# Patient Record
Sex: Female | Born: 1960 | Race: Black or African American | Hispanic: No | Marital: Single | Smoking: Never smoker
Health system: Southern US, Community
[De-identification: ages and names within clinical notes are randomized; demographics above are authoritative.]

## PROBLEM LIST (undated history)

## (undated) DIAGNOSIS — I10 Essential (primary) hypertension: Secondary | ICD-10-CM

## (undated) DIAGNOSIS — I712 Thoracic aortic aneurysm, without rupture: Secondary | ICD-10-CM

## (undated) DIAGNOSIS — R0609 Other forms of dyspnea: Secondary | ICD-10-CM

## (undated) DIAGNOSIS — M1990 Unspecified osteoarthritis, unspecified site: Secondary | ICD-10-CM

## (undated) DIAGNOSIS — E785 Hyperlipidemia, unspecified: Secondary | ICD-10-CM

## (undated) DIAGNOSIS — T7840XA Allergy, unspecified, initial encounter: Secondary | ICD-10-CM

## (undated) DIAGNOSIS — N3941 Urge incontinence: Secondary | ICD-10-CM

## (undated) DIAGNOSIS — R079 Chest pain, unspecified: Secondary | ICD-10-CM

## (undated) DIAGNOSIS — R0600 Dyspnea, unspecified: Secondary | ICD-10-CM

## (undated) HISTORY — PX: ABDOMINAL HYSTERECTOMY: SHX81

## (undated) HISTORY — DX: Other forms of dyspnea: R06.09

## (undated) HISTORY — DX: Allergy, unspecified, initial encounter: T78.40XA

## (undated) HISTORY — DX: Dyspnea, unspecified: R06.00

## (undated) HISTORY — DX: Urge incontinence: N39.41

## (undated) HISTORY — DX: Chest pain, unspecified: R07.9

## (undated) HISTORY — DX: Thoracic aortic aneurysm, without rupture: I71.2

---

## 1999-04-09 ENCOUNTER — Emergency Department (HOSPITAL_COMMUNITY): Admission: EM | Admit: 1999-04-09 | Discharge: 1999-04-09 | Payer: Self-pay | Admitting: Emergency Medicine

## 1999-04-09 ENCOUNTER — Encounter: Payer: Self-pay | Admitting: Emergency Medicine

## 2001-05-17 ENCOUNTER — Emergency Department (HOSPITAL_COMMUNITY): Admission: EM | Admit: 2001-05-17 | Discharge: 2001-05-17 | Payer: Self-pay | Admitting: Emergency Medicine

## 2001-05-18 ENCOUNTER — Encounter: Payer: Self-pay | Admitting: Emergency Medicine

## 2001-06-23 ENCOUNTER — Ambulatory Visit (HOSPITAL_COMMUNITY): Admission: RE | Admit: 2001-06-23 | Discharge: 2001-06-23 | Payer: Self-pay | Admitting: Obstetrics

## 2001-06-23 ENCOUNTER — Encounter: Payer: Self-pay | Admitting: Obstetrics

## 2001-07-07 ENCOUNTER — Ambulatory Visit (HOSPITAL_COMMUNITY): Admission: RE | Admit: 2001-07-07 | Discharge: 2001-07-07 | Payer: Self-pay | Admitting: Obstetrics

## 2001-07-07 ENCOUNTER — Encounter: Payer: Self-pay | Admitting: Obstetrics

## 2001-08-18 ENCOUNTER — Inpatient Hospital Stay (HOSPITAL_COMMUNITY): Admission: AD | Admit: 2001-08-18 | Discharge: 2001-08-18 | Payer: Self-pay | Admitting: Obstetrics

## 2001-11-02 ENCOUNTER — Inpatient Hospital Stay (HOSPITAL_COMMUNITY): Admission: AD | Admit: 2001-11-02 | Discharge: 2001-11-02 | Payer: Self-pay | Admitting: *Deleted

## 2001-11-09 ENCOUNTER — Ambulatory Visit (HOSPITAL_COMMUNITY): Admission: RE | Admit: 2001-11-09 | Discharge: 2001-11-09 | Payer: Self-pay | Admitting: *Deleted

## 2001-12-08 ENCOUNTER — Inpatient Hospital Stay (HOSPITAL_COMMUNITY): Admission: AD | Admit: 2001-12-08 | Discharge: 2001-12-08 | Payer: Self-pay | Admitting: *Deleted

## 2001-12-11 ENCOUNTER — Inpatient Hospital Stay (HOSPITAL_COMMUNITY): Admission: AD | Admit: 2001-12-11 | Discharge: 2001-12-13 | Payer: Self-pay | Admitting: *Deleted

## 2001-12-12 ENCOUNTER — Encounter (INDEPENDENT_AMBULATORY_CARE_PROVIDER_SITE_OTHER): Payer: Self-pay | Admitting: Specialist

## 2003-07-24 ENCOUNTER — Emergency Department (HOSPITAL_COMMUNITY): Admission: EM | Admit: 2003-07-24 | Discharge: 2003-07-24 | Payer: Self-pay | Admitting: Family Medicine

## 2004-01-18 ENCOUNTER — Ambulatory Visit: Payer: Self-pay | Admitting: Internal Medicine

## 2004-05-07 ENCOUNTER — Ambulatory Visit: Payer: Self-pay | Admitting: Internal Medicine

## 2004-05-07 ENCOUNTER — Other Ambulatory Visit: Admission: RE | Admit: 2004-05-07 | Discharge: 2004-05-07 | Payer: Self-pay | Admitting: Obstetrics and Gynecology

## 2004-10-03 ENCOUNTER — Emergency Department (HOSPITAL_COMMUNITY): Admission: EM | Admit: 2004-10-03 | Discharge: 2004-10-03 | Payer: Self-pay | Admitting: Family Medicine

## 2004-12-09 ENCOUNTER — Observation Stay (HOSPITAL_COMMUNITY): Admission: RE | Admit: 2004-12-09 | Discharge: 2004-12-10 | Payer: Self-pay | Admitting: Obstetrics and Gynecology

## 2004-12-09 ENCOUNTER — Encounter (INDEPENDENT_AMBULATORY_CARE_PROVIDER_SITE_OTHER): Payer: Self-pay | Admitting: Specialist

## 2005-06-29 ENCOUNTER — Emergency Department (HOSPITAL_COMMUNITY): Admission: EM | Admit: 2005-06-29 | Discharge: 2005-06-29 | Payer: Self-pay | Admitting: Emergency Medicine

## 2008-12-26 ENCOUNTER — Encounter (INDEPENDENT_AMBULATORY_CARE_PROVIDER_SITE_OTHER): Payer: Self-pay | Admitting: Emergency Medicine

## 2008-12-26 ENCOUNTER — Observation Stay (HOSPITAL_COMMUNITY): Admission: EM | Admit: 2008-12-26 | Discharge: 2008-12-26 | Payer: Self-pay | Admitting: Emergency Medicine

## 2008-12-26 ENCOUNTER — Ambulatory Visit: Payer: Self-pay | Admitting: Cardiovascular Disease

## 2009-08-21 ENCOUNTER — Emergency Department (HOSPITAL_COMMUNITY): Admission: EM | Admit: 2009-08-21 | Discharge: 2009-08-21 | Payer: Self-pay | Admitting: Emergency Medicine

## 2010-03-09 HISTORY — PX: KNEE ARTHROSCOPY: SHX127

## 2010-06-12 LAB — URINALYSIS, ROUTINE W REFLEX MICROSCOPIC
Bilirubin Urine: NEGATIVE
Hgb urine dipstick: NEGATIVE
Nitrite: NEGATIVE
Urobilinogen, UA: 1 mg/dL (ref 0.0–1.0)

## 2010-06-12 LAB — COMPREHENSIVE METABOLIC PANEL
ALT: 18 U/L (ref 0–35)
Albumin: 3.6 g/dL (ref 3.5–5.2)
Alkaline Phosphatase: 72 U/L (ref 39–117)
BUN: 11 mg/dL (ref 6–23)
Creatinine, Ser: 0.88 mg/dL (ref 0.4–1.2)
GFR calc non Af Amer: >60 mL/min (ref >60)
Sodium: 140 mEq/L (ref 135–145)

## 2010-06-12 LAB — CBC
HCT: 35.2 % — ABNORMAL LOW (ref 36.0–46.0)
Hemoglobin: 12.1 g/dL (ref 12.0–15.0)
RBC: 3.97 MIL/uL (ref 3.87–5.11)
WBC: 10.1 10*3/uL (ref 4.0–10.5)

## 2010-06-12 LAB — POCT CARDIAC MARKERS
CKMB, poc: <1 ng/mL — ABNORMAL LOW (ref 1.0–8.0)
Troponin i, poc: <0.05 ng/mL (ref 0.00–0.09)

## 2010-06-12 LAB — DIFFERENTIAL
Lymphs Abs: 2.9 10*3/uL (ref 0.7–4.0)
Monocytes Absolute: 0.8 10*3/uL (ref 0.1–1.0)
Monocytes Relative: 8 % (ref 3–12)
Neutrophils Relative %: 62 % (ref 43–77)

## 2010-06-12 LAB — CK TOTAL AND CKMB (NOT AT ARMC): Relative Index: INVALID (ref 0.0–2.5)

## 2010-07-25 NOTE — Op Note (Signed)
   NAME:  Audrey Farley, Audrey Farley                     ACCOUNT NO.:  [id number]   MEDICAL RECORD NO.:  [id number]                   PATIENT TYPE:  INP   LOCATION:  9138                                 FACILITY:  [facility]   PHYSICIAN:  Phil D. Okey Dupre, M.D.                  DATE OF BIRTH:  11/08/1960   DATE OF PROCEDURE:  12/12/2001  DATE OF DISCHARGE:                                 OPERATIVE REPORT   PROCEDURE:  Bilateral tubal ligation, modified Pomeroy.   PREOPERATIVE DIAGNOSES:  Voluntary sterilization.   POSTOPERATIVE DIAGNOSES:  Voluntary sterilization.   PROCEDURE AS FOLLOWS:  Under satisfactory epidural anesthesia with the  patient in dorsal supine position, the abdomen was prepped and draped in the  usual sterile manner.  A 4 cm transverse incision was made just below the  umbilicus and the abdomen was entered by layers.  On entering the peritoneal  cavity was a marked amount of serosanguineous fluid which was extracted with  suction.  The fallopian tube on the right was grasped with a Babcock clamp,  followed down to the fimbria, then grasped in the mid point and hemostat  placed through the meso beneath the tube in avascular area.  She had one  plain suture drawn through this and tied around the distal and proximal ends  of the tube forming a loop approximately 1 cm in length above the tube.  A  second tie was placed just below the aforementioned tie and dissection of  the tube above excised.  The ends of the tube were then coagulated with hot  cautery.  It was noted there was a slight oozing below the second tie so a  third tie was placed below that and area observed for bleeding and none was  noted and these were all cut short.  Attention was directed to the left tube  left side.  The fallopian tube was identified by the same method and the  same procedure carried out with a suction of tube excised.  The peritoneum  and fascia were then closed with a 0 Vicryl suture which was  continued  subcuticular closure.  Dry, sterile dressing was applied and patient  transferred to recovery room in satisfactory condition after having  tolerated the procedure well.                                               Phil D. Okey Dupre, M.D.    PDR/MEDQ  D:  12/12/2001  T:  12/12/2001  Job:  [id number]

## 2010-07-25 NOTE — Discharge Summary (Signed)
NAME:  Audrey Farley, Audrey Farley           ACCOUNT NO.:  [id number]   MEDICAL RECORD NO.:  [id number]          PATIENT TYPE:  OBV   LOCATION:  9319                          [facility]ACILITY:  [facility]   PHYSICIAN:  Duke Salvia. Marcelle Overlie, M.D.DATE OF BIRTH:  [birth year]   DATE OF ADMISSION:  12/09/2004  DATE OF DISCHARGE:  12/10/2004                                 DISCHARGE SUMMARY   DISCHARGE DIAGNOSES:  1.  Pelvic pain and dyspareunia.  2.  Probable adenomyosis.  3.  LSH this admission.   HISTORY OF PRESENT ILLNESS:  Please see admission H&P for details.  Briefly  a 50 year old gravida 4, para 2, prior tubal with pelvic pain and  dysmenorrhea whose evaluation is consistent with adenomyosis, presents for  [facility].   HOSPITAL COURSE:  On October 3, under general anesthesia, the patient  underwent laparoscopy with [facility].  Estimated blood loss was 300 mL.  The  uterus was symmetrically enlarged, at the time approximately 8 weeks size.   She remained afebrile the night of surgery.  On postoperative day #1, the  catheter was removed.  She was ambulating.  Her diet was advanced.  She  stayed until later in the afternoon because of still feeling some nausea and  mild dizziness which improved significantly and at 4:30 she was ready for  discharge.  On postoperative day #1, she was afebrile, her vital signs were  stable.  Preoperative hemoglobin was 11.6.  On October 3, in the afternoon,  hemoglobin 10.8.  On October 4, hemoglobin 9.9.   LABORATORY DATA:  Blood type was O positive, antibody screen negative.  Admission UA was negative except for 1+ glucose.   DISPOSITION:  The patient was discharged on Tylox p.r.n. pain, advised to  report any incisional redness or drainage, increased pain or bleeding, or  fever over 101.  Was given specific instructions regarding diet, sex, and  exercise.  Will return to the office in 1 week.  Multivitamin with iron  daily advised.      Richard M. Marcelle Overlie, M.D.  Electronically  Signed     RMH/MEDQ  D:  12/10/2004  T:  12/11/2004  Job:  [id number]

## 2010-07-25 NOTE — Op Note (Signed)
NAME:  Audrey Farley, Audrey Farley           ACCOUNT NO.:  [id number]   MEDICAL RECORD NO.:  [id number]          PATIENT TYPE:  OBV   LOCATION:  9399                          [facility]  [facility]   PHYSICIAN:  Duke Salvia. Marcelle Overlie, M.D.DATE OF BIRTH:  [birth year]   DATE OF PROCEDURE:  12/09/2004  DATE OF DISCHARGE:                                 OPERATIVE REPORT   PREOPERATIVE DIAGNOSIS:  Pelvic pain, probable adenomyosis.   POSTOPERATIVE DIAGNOSIS:  Pelvic pain, probable adenomyosis.   PROCEDURE:  LSH.   SURGEON:  Duke Salvia. Marcelle Overlie, M.D.   ASSISTANT:  Guy Sandifer. Henderson Cloud, M.D.   ANESTHESIA:  General endotracheal.   ESTIMATED BLOOD LOSS:  300 mL.   SPECIMENS REMOVED:  Uterine fundus.   DESCRIPTION OF PROCEDURE:  The patient was taken to the operating room,  after adequate level of general endotracheal anesthesia was obtained with  the patient's legs in stirrups. The abdomen, perineum and vagina were  prepped and draped in usual manner for a LSH. The bladder was drained, EUA  carried out. Uterus upper limits normal size, mid position, mobile, adnexa  negative. The subumbilical area was infiltrated with half percent Marcaine  plain ans a small incision was made. The Veress needle was introduced  without difficulty and its intra-abdominal position was verified by pressure  and water testing. After 2.5 liter pneumoperitoneum was created,  laparoscopic trocar and sleeve were then introduced without difficulty.  There was no evidence of any bleeding or trauma. The patient placed in  slight Trendelenburg. Negative transillumination used to mark the left and  right lower quadrant area and the 11 mm bladeless trocar was inserted under  direct visualization on either side. The uterine fundus then grasped with  tenaculum. Careful inspection revealed both ovaries to be unremarkable. The  anterior-posterior spaces were free and clear. The uterus was boggy  consistent with adenomyosis. Starting on the left,  harmonic scalpel was then  used to coagulate and cut down to including the round ligament. The anterior  peritoneum was then carried across to the midline. The bladder was bluntly  dissected below. The same was repeated on the opposite side conserving both  ovaries. After this was completed minimal blunt dissection was used to push  the bladder flap below. Starting on the left the ascending branch of the  uterine artery was coagulated in several areas and cut. The exact same  repeated on the opposite side. The fundus started to blanch at that point.  The harmonic scalpel was then used to remove the remainder of the uterine  fundus which was hemostatic. The endocervical canal was coagulated with the  harmonic scalpel on low setting. Pelvis was then irrigated with saline and  aspirated. The bipolar was used along the left angle for complete  hemostasis. Once this was hemostatic, morcellator was introduced into the  left lower quadrant and the fundus was morcellated without difficulty. After  this completed extensive irrigation was carried out with aspiration. The  operative site was hemostatic. The gas pressure was reduced and excellent  hemostasis was noted. No tissue fragments remained. Intercede was placed  across the cervical stump.  The  laparoscopic suture passer was used to then close the left lower quadrant  fascia and the remainder of the incisions were closed with 4-0 subcuticular  sutures and Dermabond. Gas was allowed to escape before the incisions were  closed. Audrey Farley tolerated this well and went to recovery room in good condition.      Richard M. Marcelle Overlie, M.D.  Electronically Signed     RMH/MEDQ  D:  12/09/2004  T:  12/09/2004  Job:  [id number]

## 2010-07-25 NOTE — H&P (Signed)
NAME:  Audrey Farley, Audrey Farley           ACCOUNT NO.:  [id number]   MEDICAL RECORD NO.:  [id number]          PATIENT TYPE:  AMB   LOCATION:  SDC                           [facility]  [facility]   PHYSICIAN:  Duke Salvia. Marcelle Overlie, M.D.DATE OF BIRTH:  1960-08-03   DATE OF ADMISSION:  DATE OF DISCHARGE:                                HISTORY & PHYSICAL   DATE OF SURGERY:  December 09, 2004   CHIEF COMPLAINT:  Pelvic pain, possible adenomyosis.   HISTORY OF PRESENT ILLNESS:  A 50 year old G4 P2, prior tubal, who has  complained of pelvic pain and dysmenorrhea for the last 6-12 months.  Evaluation at Urgent Care in August with a CT was stated to be normal. By  exam, uterus upper limit of normal size.   Our ultrasound dated May 30, 2004 showed an inhomogeneous myometrium,  thickened, with some cystic areas suspicious for adenomyosis, several very  small leiomyoma, adnexa otherwise unremarkable. Pain has required narcotics,  did not get good relief with nonsteroidal medicines, and declined further  treatment with Depo-Provera, OCPs, etc. She presents now for [facility]. She has  had no prior surgery other than the tubal, and no history of abnormal  cytology in the past. Last Pap March 2006 was normal. She presents now for  [facility]. This procedure including risks of bleeding, infection, transfusion,  adjacent organ injury, the possible need for open or additional surgery  discussed. The need for yearly cytology post [facility] reviewed, along with the  small chance of some residual monthly spotting.   PAST MEDICAL HISTORY:  Allergies:  None. Operations:  Tubal ligation.   OBSTETRICAL HISTORY:  Two vaginal deliveries at term, one stillborn in 71,  and one miscarriage.   REVIEW OF SYSTEMS:  Significant for migraine headache.   FAMILY HISTORY:  Mother decreased from kidney failure and also she had  kidney stone. Family history of hypertension also.   PHYSICAL EXAMINATION:  VITAL SIGNS:  Temperature 98.2, blood  pressure  124/84.  HEENT:  Unremarkable.  NECK:  Supple without masses.  LUNGS:  Clear.  CARDIOVASCULAR:  Regular rate and rhythm without murmurs, rubs, or gallops  noted.  BREASTS:  Without masses.  ABDOMEN:  Soft, flat, and nontender.  PELVIC:  Normal external genitalia, vagina and cervix clear. Uterus mid  position, normal size, adnexa negative.   IMPRESSION:  Pelvic pain, probable adenomyosis.   PLAN:  LSH. Procedure and risks reviewed as above.      Richard M. Marcelle Overlie, M.D.  Electronically Signed     RMH/MEDQ  D:  12/05/2004  T:  12/05/2004  Job:  [id number]

## 2010-10-17 ENCOUNTER — Other Ambulatory Visit: Payer: Self-pay | Admitting: Family Medicine

## 2010-10-17 ENCOUNTER — Ambulatory Visit
Admission: RE | Admit: 2010-10-17 | Discharge: 2010-10-17 | Disposition: A | Discharge status: Home / Self Care | Payer: Managed Care, Other (non HMO) | Source: Ambulatory Visit | Attending: Family Medicine | Admitting: Family Medicine

## 2010-10-17 DIAGNOSIS — R609 Edema, unspecified: Secondary | ICD-10-CM

## 2010-10-17 DIAGNOSIS — R52 Pain, unspecified: Secondary | ICD-10-CM

## 2010-10-23 ENCOUNTER — Other Ambulatory Visit: Payer: Self-pay | Admitting: Family Medicine

## 2010-10-23 DIAGNOSIS — M25562 Pain in left knee: Secondary | ICD-10-CM

## 2010-11-06 ENCOUNTER — Other Ambulatory Visit: Payer: Managed Care, Other (non HMO)

## 2010-11-11 ENCOUNTER — Emergency Department (HOSPITAL_BASED_OUTPATIENT_CLINIC_OR_DEPARTMENT_OTHER)
Admission: EM | Admit: 2010-11-11 | Discharge: 2010-11-11 | Disposition: A | Discharge status: Home / Self Care | Payer: Managed Care, Other (non HMO) | Attending: Emergency Medicine | Admitting: Emergency Medicine

## 2010-11-11 ENCOUNTER — Encounter: Payer: Self-pay | Admitting: *Deleted

## 2010-11-11 DIAGNOSIS — I10 Essential (primary) hypertension: Secondary | ICD-10-CM | POA: Insufficient documentation

## 2010-11-11 DIAGNOSIS — M25569 Pain in unspecified knee: Secondary | ICD-10-CM | POA: Insufficient documentation

## 2010-11-11 DIAGNOSIS — IMO0001 Reserved for inherently not codable concepts without codable children: Secondary | ICD-10-CM | POA: Insufficient documentation

## 2010-11-11 DIAGNOSIS — S8990XA Unspecified injury of unspecified lower leg, initial encounter: Secondary | ICD-10-CM

## 2010-11-11 DIAGNOSIS — M79609 Pain in unspecified limb: Secondary | ICD-10-CM | POA: Insufficient documentation

## 2010-11-11 HISTORY — DX: Essential (primary) hypertension: I10

## 2010-11-11 MED ORDER — HYDROCODONE-ACETAMINOPHEN 5-325 MG PO TABS: 2.0000 | ORAL_TABLET | ORAL | Status: AC | PRN

## 2010-11-11 NOTE — ED Notes (Signed)
Left leg pain. Left leg pain x 2 months. [facility] and told she has a cyst behind her knee. She is suppose to schedule an MRI.

## 2010-11-11 NOTE — ED Provider Notes (Signed)
History     CSN: [id number] Arrival date & time: 11/11/2010  2:34 PM  Chief Complaint  Patient presents with  . Leg Pain   HPI Comments: Pt had doppler and was diagnosed with a popliteal cyst.  Pt complains of continued knee pain.  Pt reports knee feels like it is giving away.  Patient is a 50 y.o. female presenting with leg pain and knee pain.  Leg Pain  The incident occurred more than 1 week ago. The incident occurred at work. There was no injury mechanism. The pain is present in the left knee. The quality of the pain is described as sharp and throbbing. The pain is at a severity of 6/10. The pain is moderate. The pain has been constant since onset. She reports no foreign bodies present. The symptoms are aggravated by bearing weight. She has tried ice and NSAIDs for the symptoms.  Knee Pain This is a recurrent problem. The current episode started 1 to 4 weeks ago. The problem occurs constantly. The problem has been rapidly worsening. Associated symptoms include joint swelling and myalgias. The symptoms are aggravated by nothing. She has tried nothing for the symptoms. The treatment provided moderate relief.    Past Medical History  Diagnosis Date  . Hypertension     Past Surgical History  Procedure Date  . Abdominal hysterectomy     No family history on file.  History  Substance Use Topics  . Smoking status: Never Smoker   . Smokeless tobacco: Not on file  . Alcohol Use: Yes    OB History    Grav Para Term Preterm Abortions TAB SAB Ect Mult Living                  Review of Systems  Musculoskeletal: Positive for myalgias and joint swelling.  All other systems reviewed and are negative.    Physical Exam  BP 124/73  Pulse 79  Temp(Src) 98.2 F (36.8 C) (Oral)  Resp 20  SpO2 97%  Physical Exam  Nursing note and vitals reviewed. Constitutional: She is oriented to person, place, and time. She appears well-developed and well-nourished.  HENT:  Head:  Normocephalic and atraumatic.  Eyes: Pupils are equal, round, and reactive to light.  Neck: Normal range of motion.  Musculoskeletal: She exhibits edema and tenderness.  Neurological: She is alert and oriented to person, place, and time. She has normal reflexes.       Swollen knee,  Decreased range of motion,  nv and ns intact  Skin: Skin is warm and dry.  Psychiatric: She has a normal mood and affect.    ED Course  Procedures  MDM Pt placed in knee immbolizer, crutches,  Pt given hydrocodone.  Pt scheduled for MRi.      Langston Masker, [place] 11/11/10 1510  Langston Masker, [place] 11/11/10 [phone]

## 2010-11-11 NOTE — ED Provider Notes (Signed)
Medical screening examination/treatment/procedure(s) were performed by non-physician practitioner and as supervising physician I was immediately available for consultation/collaboration.   Glynn Octave, MD 11/11/10 [phone]

## 2010-11-12 ENCOUNTER — Ambulatory Visit (HOSPITAL_BASED_OUTPATIENT_CLINIC_OR_DEPARTMENT_OTHER)
Admission: RE | Admit: 2010-11-12 | Discharge: 2010-11-12 | Disposition: A | Discharge status: Home / Self Care | Payer: Managed Care, Other (non HMO) | Source: Ambulatory Visit | Attending: Physician Assistant | Admitting: Physician Assistant

## 2010-11-12 DIAGNOSIS — M7120 Synovial cyst of popliteal space [Baker], unspecified knee: Secondary | ICD-10-CM

## 2010-11-12 DIAGNOSIS — M25869 Other specified joint disorders, unspecified knee: Secondary | ICD-10-CM | POA: Insufficient documentation

## 2010-11-12 DIAGNOSIS — M23329 Other meniscus derangements, posterior horn of medial meniscus, unspecified knee: Secondary | ICD-10-CM

## 2010-11-12 DIAGNOSIS — IMO0002 Reserved for concepts with insufficient information to code with codable children: Secondary | ICD-10-CM | POA: Insufficient documentation

## 2010-11-12 DIAGNOSIS — M1710 Unilateral primary osteoarthritis, unspecified knee: Secondary | ICD-10-CM | POA: Insufficient documentation

## 2010-11-12 DIAGNOSIS — M25569 Pain in unspecified knee: Secondary | ICD-10-CM

## 2010-11-12 DIAGNOSIS — M25669 Stiffness of unspecified knee, not elsewhere classified: Secondary | ICD-10-CM

## 2010-11-13 ENCOUNTER — Encounter: Payer: Self-pay | Admitting: Family Medicine

## 2010-11-13 ENCOUNTER — Ambulatory Visit (INDEPENDENT_AMBULATORY_CARE_PROVIDER_SITE_OTHER): Payer: Managed Care, Other (non HMO) | Admitting: Family Medicine

## 2010-11-13 VITALS — BP 154/94

## 2010-11-13 DIAGNOSIS — M25569 Pain in unspecified knee: Secondary | ICD-10-CM

## 2010-11-13 DIAGNOSIS — M25562 Pain in left knee: Secondary | ICD-10-CM

## 2010-11-13 NOTE — Assessment & Plan Note (Addendum)
2/2 medial meniscal tear and ruptured baker's cyst.  We discussed her options.  Given size of meniscal tear noted conservative care is less likely to be beneficial to her but we should try to proceed with this first - start PT, intraarticular cortisone injection.  Icing, aleve.  F/u in 2 weeks.  If not improving, advised her we would refer her to ortho for consideration of arthroscopic debridement.  See instructions for further.  After informed written consent, patient was seated on exam table. Left knee was prepped with alcohol swab and utilizing anterolateral approach, patient's left knee was injected intraarticularly with 3:1 marcaine: depomedrol. Patient tolerated the procedure well without immediate complications.

## 2010-11-13 NOTE — Patient Instructions (Signed)
You have a tear in your medial meniscus as well as a bakers cyst that has ruptured causing pain down into our calf. You were given a cortisone injection today. Also start physical therapy for 1-3 visits to learn home exercise program. Ice your knee 15 minutes at a time 3-4 times a day. Only wear the immobilizer as needed - the more you wear this, the weaker your knee gets. Elevate above the level of your heart for swelling. Ok to take aleve 1-2 tabs twice a day with food for pain and inflammation. Follow up with me in 2 weeks. If you are not improving, I would suggest seeing a surgeon for arthroscopy.

## 2010-11-13 NOTE — Progress Notes (Signed)
Subjective:    Patient ID: Audrey Farley, female    DOB: [birth year], 51 y.o.   MRN: [id number]  PCP: Cira Servant  HPI 50 yo F here for left knee pain.  Patient denies known injury. She reports about 2 months ago started developing left knee pain. Pain primarily anteromedial. Associated with swelling but no bruising. No prior issues with left knee, no surgeries. Reports feeling unstable, catching and some locking. Went to [facility] and had x-rays that were negative per her report - also had negative doppler u/s for DVT as she was having calf pain (and still has some). Came to ED and had MRI of left knee that showed a large medial meniscus tear as well as a bakers cyst that has ruptured with fluid traveling into medial gastroc. Given some hydrocodone - otherwise not taking any regular pain medicines/nsaids.  Past Medical History  Diagnosis Date  . Hypertension     Current Outpatient Prescriptions on File Prior to Visit  Medication Sig Dispense Refill  . HYDROcodone-acetaminophen (NORCO) 5-325 MG per tablet Take 2 tablets by mouth every 4 (four) hours as needed for pain.  20 tablet  0  . Telmisartan (MICARDIS PO) Take by mouth.          Past Surgical History  Procedure Date  . Abdominal hysterectomy     No Known Allergies  History   Social History  . Marital Status: Single    Spouse Name: N/A    Number of Children: N/A  . Years of Education: N/A   Occupational History  . Not on file.   Social History Main Topics  . Smoking status: Never Smoker   . Smokeless tobacco: Not on file  . Alcohol Use: Yes  . Drug Use: No  . Sexually Active:    Other Topics Concern  . Not on file   Social History Narrative  . No narrative on file    Family History  Problem Relation Age of Onset  . Diabetes Mother   . Hypertension Mother   . Heart attack Mother   . Hyperlipidemia Mother   . Hyperlipidemia Sister   . Hypertension Sister   . Sudden death Neg Hx     BP  154/94  Review of Systems See HPI above.    Objective:   Physical Exam Gen: NAD, obese L knee: Soft tissue swelling bilaterally - difficult to discern effusion.  Mild warmth compared to right knee.  Otherwise no gross deformity, ecchymoses. Medial > lateral joint line TTP.  Mod TTP medial gastroc throughout and mild TTP post patellar facets. ROM 0-90 degrees. Negative ant/post drawers. Negative valgus/varus testing. Negative lachmanns. Mcmurrays and apleys without click but diffuse anterior pain with both.   Negative apprehension. NV intact distally.    Assessment & Plan:  1. Left knee pain - 2/2 medial meniscal tear and ruptured baker's cyst.  We discussed her options.  Given size of meniscal tear noted conservative care is less likely to be beneficial to her but we should try to proceed with this first - start PT, intraarticular cortisone injection.  Icing, aleve.  F/u in 2 weeks.  If not improving, advised her we would refer her to ortho for consideration of arthroscopic debridement.  See instructions for further.  After informed written consent, patient was seated on exam table. Left knee was prepped with alcohol swab and utilizing anterolateral approach, patient's left knee was injected intraarticularly with 3:1 marcaine: depomedrol. Patient tolerated the procedure well without immediate complications.

## 2010-11-21 ENCOUNTER — Ambulatory Visit: Payer: Managed Care, Other (non HMO) | Admitting: Physical Therapy

## 2010-11-28 ENCOUNTER — Ambulatory Visit (INDEPENDENT_AMBULATORY_CARE_PROVIDER_SITE_OTHER): Payer: Managed Care, Other (non HMO) | Admitting: Family Medicine

## 2010-11-28 ENCOUNTER — Encounter: Payer: Self-pay | Admitting: Family Medicine

## 2010-11-28 VITALS — BP 165/104 | HR 69 | Temp 97.9°F | Ht 64.0 in | Wt 238.0 lb

## 2010-11-28 DIAGNOSIS — M25562 Pain in left knee: Secondary | ICD-10-CM

## 2010-11-28 DIAGNOSIS — M25569 Pain in unspecified knee: Secondary | ICD-10-CM

## 2010-11-28 NOTE — Progress Notes (Signed)
Subjective:    Patient ID: Audrey Farley, female    DOB: 12-Apr-1960, 50 y.o.   MRN: [id number]  PCP: Cira Servant  HPI  50 yo F here for 2 week f/u left knee pain.  9/6: Patient denies known injury. She reports about 2 months ago started developing left knee pain. Pain primarily anteromedial. Associated with swelling but no bruising. No prior issues with left knee, no surgeries. Reports feeling unstable, catching and some locking. Went to [facility] and had x-rays that were negative per her report - also had negative doppler u/s for DVT as she was having calf pain (and still has some). Came to ED and had MRI of left knee that showed a large medial meniscus tear as well as a bakers cyst that has ruptured with fluid traveling into medial gastroc. Given some hydrocodone - otherwise not taking any regular pain medicines/nsaids.  9/21: Patient had only mild improvement with cortisone injection - pain and swelling in calf have improved since this but still with fairly severe medial knee pain where she has a meniscal tear. Icing makes this worse so no longer using this. Taking ibuprofen regularly which does help. Has an immobilizer that she's had to use for support. Insurance would not cover PT for her.  Past Medical History  Diagnosis Date  . Hypertension     Current Outpatient Prescriptions on File Prior to Visit  Medication Sig Dispense Refill  . Telmisartan (MICARDIS PO) Take by mouth.          Past Surgical History  Procedure Date  . Abdominal hysterectomy     No Known Allergies  History   Social History  . Marital Status: Single    Spouse Name: N/A    Number of Children: N/A  . Years of Education: N/A   Occupational History  . Not on file.   Social History Main Topics  . Smoking status: Never Smoker   . Smokeless tobacco: Not on file  . Alcohol Use: Yes  . Drug Use: No  . Sexually Active: Not on file   Other Topics Concern  . Not on file   Social  History Narrative  . No narrative on file    Family History  Problem Relation Age of Onset  . Diabetes Mother   . Hypertension Mother   . Heart attack Mother   . Hyperlipidemia Mother   . Hyperlipidemia Sister   . Hypertension Sister   . Sudden death Neg Hx     BP 165/104  Pulse 69  Temp(Src) 97.9 F (36.6 C) (Oral)  Ht 5\' 4"  (1.626 m)  Wt 238 lb (107.956 kg)  BMI 40.85 kg/m2  Review of Systems  See HPI above.    Objective:   Physical Exam  Gen: NAD, obese L knee: Soft tissue swelling bilaterally - difficult to discern effusion.  Mild warmth compared to right knee.  Otherwise no gross deformity, ecchymoses. Severe Medial, mild lateral joint line TTP.  Minimal TTP medial gastroc throughout. ROM 0-90 degrees. Negative ant/post drawers. Negative valgus/varus testing. Negative lachmanns. Mcmurrays and apleys without click but anteromedial pain with both.   Negative apprehension. NV intact distally.    Assessment & Plan:  1. Left knee pain - 2/2 medial meniscal tear and ruptured baker's cyst.  Patient wanted to try conservative care for a couple weeks before considering surgical intervention - very mild improvement with injection and fairly significant pain medial joint line and with mcmurrays medially.  Will refer to orthopedics for  consideration of arthroscopic debridement.

## 2010-11-28 NOTE — Assessment & Plan Note (Signed)
2/2 medial meniscal tear and ruptured baker's cyst.  Patient wanted to try conservative care for a couple weeks before considering surgical intervention - very mild improvement with injection and fairly significant pain medial joint line and with mcmurrays medially.  Will refer to orthopedics for consideration of arthroscopic debridement.

## 2012-04-15 ENCOUNTER — Ambulatory Visit (INDEPENDENT_AMBULATORY_CARE_PROVIDER_SITE_OTHER): Payer: Managed Care, Other (non HMO) | Admitting: Physician Assistant

## 2012-04-15 VITALS — BP 172/92 | HR 79 | Temp 98.1°F | Resp 18 | Wt 229.0 lb

## 2012-04-15 DIAGNOSIS — IMO0002 Reserved for concepts with insufficient information to code with codable children: Secondary | ICD-10-CM

## 2012-04-15 DIAGNOSIS — I10 Essential (primary) hypertension: Secondary | ICD-10-CM | POA: Insufficient documentation

## 2012-04-15 DIAGNOSIS — M79622 Pain in left upper arm: Secondary | ICD-10-CM

## 2012-04-15 DIAGNOSIS — L02412 Cutaneous abscess of left axilla: Secondary | ICD-10-CM

## 2012-04-15 DIAGNOSIS — M79609 Pain in unspecified limb: Secondary | ICD-10-CM

## 2012-04-15 MED ORDER — HYDROCODONE-ACETAMINOPHEN 5-325 MG PO TABS: 1.0000 | ORAL_TABLET | Freq: Four times a day (QID) | ORAL | Status: DC | PRN

## 2012-04-15 MED ORDER — DOXYCYCLINE HYCLATE 100 MG PO TABS: 100.0000 mg | ORAL_TABLET | Freq: Two times a day (BID) | ORAL | Status: DC

## 2012-04-15 NOTE — Progress Notes (Signed)
   [address] [place] [address]   Phone [phone]  Subjective:    Patient ID: Audrey Farley, female    DOB: 08-02-1960, 52 y.o.   MRN: [id number]  HPI  Pt presents to clinic with an area under her L axilla for the past 2 weeks.  She has been trying to get out pus but it is still getting bigger and painful.   Review of Systems  Constitutional: Negative for fever and chills.  Gastrointestinal: Negative for nausea.  Skin: Positive for wound.       Objective:   Physical Exam  Vitals reviewed. Constitutional: She is oriented to person, place, and time. She appears well-developed and well-nourished.  HENT:  Head: Normocephalic and atraumatic.  Right Ear: External ear normal.  Left Ear: External ear normal.  Pulmonary/Chest: Effort normal.  Neurological: She is alert and oriented to person, place, and time.  Skin: Skin is warm and dry.       6 cm indurated area with erythema - with a central opening with purulent drainage from it.     Procedure:  Consent obtained.  Local anesthesia with 1% lido with epi.  #11 blade used to make the incision larger.  Copious purulent d/c from wound.  Packed with 1/4 plain packing - Drsg placed.  Wound culture obtained.      Assessment & Plan:   1. Abscess of left axilla  Wound culture, doxycycline (VIBRA-TABS) 100 MG tablet  2. Pain in left axilla  HYDROcodone-acetaminophen (NORCO/VICODIN) 5-325 MG per tablet   Rechecked pts blood pressure and she should monitor it at home because it was significantly elevated today.

## 2012-04-15 NOTE — Patient Instructions (Addendum)
Change dressing daily.  Take antibiotics and pain medications.  Warm compresses to area.  Recheck in 48h.

## 2012-04-17 ENCOUNTER — Ambulatory Visit (INDEPENDENT_AMBULATORY_CARE_PROVIDER_SITE_OTHER): Payer: Managed Care, Other (non HMO) | Admitting: Physician Assistant

## 2012-04-17 VITALS — BP 160/104 | HR 65 | Temp 97.8°F | Resp 16 | Ht 64.0 in | Wt 227.0 lb

## 2012-04-17 DIAGNOSIS — L02419 Cutaneous abscess of limb, unspecified: Secondary | ICD-10-CM

## 2012-04-17 DIAGNOSIS — IMO0002 Reserved for concepts with insufficient information to code with codable children: Secondary | ICD-10-CM

## 2012-04-17 LAB — WOUND CULTURE

## 2012-04-17 NOTE — Progress Notes (Signed)
Patient ID: Audrey Farley MRN: [id number], DOB: 1961-01-23 52 y.o. Date of Encounter: 04/17/2012, 11:57 AM  Chief Complaint: Wound care   See previous note  HPI: 52 y.o. y/o female presents for wound care s/p I&D on 04/15/12.  Doing well No issues or complaints Afebrile/ no chills No nausea or vomiting Tolerating doxycycline.  Pain improved but still very sore.   She has not changed the dressing but does admit that the packing came out this a.m when she took off the bandage before coming in.  Previous note reviewed  Past Medical History  Diagnosis Date  . Hypertension   . Allergy      Home Meds: Prior to Admission medications   Medication Sig Start Date End Date Taking? Authorizing Provider  doxycycline (VIBRA-TABS) 100 MG tablet Take 1 tablet (100 mg total) by mouth 2 (two) times daily. 04/15/12   Morrell Riddle, PA-C  HYDROcodone-acetaminophen (NORCO/VICODIN) 5-325 MG per tablet Take 1 tablet by mouth every 6 (six) hours as needed for pain. 04/15/12   Morrell Riddle, PA-C  telmisartan-hydrochlorothiazide (MICARDIS HCT) 80-12.5 MG per tablet Take 1 tablet by mouth daily.    Historical Provider, MD    Allergies: No Known Allergies  ROS: Constitutional: Afebrile, no chills Cardiovascular: negative for chest pain or palpitations Dermatological: Positive for wound and tenderness. Negative for erythema, pain, or warmth.  GI: No nausea or vomiting   EXAM: Physical Exam: Blood pressure 160/104, pulse 65, temperature 97.8 F (36.6 C), resp. rate 16, height 5\' 4"  (1.626 m), weight 227 lb (102.967 kg)., Body mass index is 38.95 kg/(m^2). General: Well developed, well nourished, in no acute distress. Nontoxic appearing. Head: Normocephalic, atraumatic, sclera non-icteric.  Neck: Supple. Lungs: Breathing is unlabored. Heart: Normal rate. Skin:  Warm and moist. Dressing and packing in place. Minimal surrounding induration and tenderness to palpation.  No erythema or warmth.  Neuro:  Alert and oriented X 3. Moves all extremities spontaneously. Normal gait.  Psych:  Responds to questions appropriately with a normal affect.       PROCEDURE: Dressing and packing removed. Moderate amount of purulence expressed Wound bed healthy Irrigated with 2% plain lidocaine 10 cc. Repacked with 1/4 plain packing.  Dressing applied  LAB: Culture: Staph aureous, not MRSA  A/P: 52 y.o. y/o female with axillary cellulitis/abscess as above s/p I&D on 04/15/12 Wound care per above Continue doxycycline.  Pain well controlled Daily dressing changes Recheck 48 hours  Signed, Rhoderick Moody, PA-C 04/17/2012 11:57 AM

## 2012-04-19 ENCOUNTER — Ambulatory Visit (INDEPENDENT_AMBULATORY_CARE_PROVIDER_SITE_OTHER): Payer: Managed Care, Other (non HMO) | Admitting: Physician Assistant

## 2012-04-19 VITALS — BP 158/10 | HR 87 | Temp 98.4°F | Resp 16 | Ht 64.0 in | Wt 230.0 lb

## 2012-04-19 DIAGNOSIS — IMO0002 Reserved for concepts with insufficient information to code with codable children: Secondary | ICD-10-CM

## 2012-04-19 DIAGNOSIS — L02412 Cutaneous abscess of left axilla: Secondary | ICD-10-CM

## 2012-04-19 NOTE — Progress Notes (Signed)
   [address], [place] [address]   Phone [phone]  Subjective:    Patient ID: Audrey Farley, female    DOB: 1961/02/21, 52 y.o.   MRN: [id number]  HPI  Pt presents to clinic for wound recheck.  She has been tolerating her abx fair, it makes her stomach upset because she has been taking it on an empty stomach.  She has had quite a bit of drainage from the site but overall it feel better.  She thinks that the packing fell out yesterday.  Review of Systems  Gastrointestinal: Negative for nausea.  Skin: Positive for wound.       Objective:   Physical Exam  Vitals reviewed. Constitutional: She is oriented to person, place, and time. She appears well-developed and well-nourished.  HENT:  Head: Normocephalic and atraumatic.  Right Ear: External ear normal.  Left Ear: External ear normal.  Pulmonary/Chest: Effort normal.  Neurological: She is alert and oriented to person, place, and time.  Skin: Skin is warm and dry.  Packing is out, good granulation tissue at the wound edge but the wound tracks down.  It is irrigated with 1% lido and then repacked.  There is still a significant amount of induration at the inferior aspect of the wound.  Psychiatric: She has a normal mood and affect. Her behavior is normal. Judgment and thought content normal.          Assessment & Plan:   1. Abscess of left axilla    Continue abx.  Try with food but no milk products 2hrs before and 2 hrs after.  Continue daily drsg change.  Recheck in 3 days

## 2012-04-23 ENCOUNTER — Ambulatory Visit (INDEPENDENT_AMBULATORY_CARE_PROVIDER_SITE_OTHER): Payer: Managed Care, Other (non HMO) | Admitting: Physician Assistant

## 2012-04-23 VITALS — BP 175/100 | HR 54 | Temp 97.7°F | Resp 18 | Ht 64.0 in | Wt 229.0 lb

## 2012-04-23 DIAGNOSIS — L02419 Cutaneous abscess of limb, unspecified: Secondary | ICD-10-CM

## 2012-04-23 DIAGNOSIS — IMO0002 Reserved for concepts with insufficient information to code with codable children: Secondary | ICD-10-CM

## 2012-04-23 NOTE — Progress Notes (Signed)
  Subjective:    Patient ID: Audrey Farley, female    DOB: 1960/08/27, 52 y.o.   MRN: [id number]  HPI    Audrey Farley is a 53 yr old female here for recheck of abscessed drained here on 04/15/12.  States she is feeling much better.  Pain is well controlled. Tolerating the antibiotics.  Continues to do warm compresses.  Small amount of drainage continues    Review of Systems  All other systems reviewed and are negative.       Objective:   Physical Exam  Vitals reviewed. Constitutional: She is oriented to person, place, and time. She appears well-developed and well-nourished. No distress.  HENT:  Head: Normocephalic and atraumatic.  Pulmonary/Chest: Effort normal.  Neurological: She is alert and oriented to person, place, and time.  Skin: Skin is warm and dry.  Wound of left axilla; no surrounding erythema or induration  Psychiatric: She has a normal mood and affect. Her behavior is normal.    Filed Vitals:   04/23/12 1545  BP: 175/100  Pulse: 54  Temp: 97.7 F (36.5 C)  Resp: 18    Wound Care: Dressing and packing removed.  Packing saturated with purulence.  Wound bed visible and healthy.  No surrounding erythema or induration.  Unable to express any drainage from the wound.  Irrigated with 5cc 1% plain lidocaine.  Repacked with a small amount of 1/4" plain packing.  Dressing applied.      Assessment & Plan:  Abscess, axilla   Audrey Farley is a 52 yr old female here for recheck of wound in the left axilla.  Wound appears to be healing well.  Base of the wound is visible and healthy.  I have repacked the area today.  Pt can remove the packing in two days, at which point I do not think it will need to be repacked.  Finish abx.  No need for further follow-up unless pt has concerns.

## 2013-01-17 ENCOUNTER — Encounter (HOSPITAL_BASED_OUTPATIENT_CLINIC_OR_DEPARTMENT_OTHER): Payer: Self-pay | Admitting: Emergency Medicine

## 2013-01-17 ENCOUNTER — Emergency Department (HOSPITAL_BASED_OUTPATIENT_CLINIC_OR_DEPARTMENT_OTHER)
Admission: EM | Admit: 2013-01-17 | Discharge: 2013-01-17 | Disposition: A | Discharge status: Home / Self Care | Payer: Managed Care, Other (non HMO) | Attending: Emergency Medicine | Admitting: Emergency Medicine

## 2013-01-17 ENCOUNTER — Emergency Department (HOSPITAL_BASED_OUTPATIENT_CLINIC_OR_DEPARTMENT_OTHER): Payer: Managed Care, Other (non HMO)

## 2013-01-17 DIAGNOSIS — R0789 Other chest pain: Secondary | ICD-10-CM | POA: Insufficient documentation

## 2013-01-17 DIAGNOSIS — Z79899 Other long term (current) drug therapy: Secondary | ICD-10-CM | POA: Insufficient documentation

## 2013-01-17 DIAGNOSIS — M549 Dorsalgia, unspecified: Secondary | ICD-10-CM | POA: Insufficient documentation

## 2013-01-17 DIAGNOSIS — R110 Nausea: Secondary | ICD-10-CM | POA: Insufficient documentation

## 2013-01-17 DIAGNOSIS — R05 Cough: Secondary | ICD-10-CM | POA: Insufficient documentation

## 2013-01-17 DIAGNOSIS — R059 Cough, unspecified: Secondary | ICD-10-CM | POA: Insufficient documentation

## 2013-01-17 DIAGNOSIS — R079 Chest pain, unspecified: Secondary | ICD-10-CM

## 2013-01-17 DIAGNOSIS — I10 Essential (primary) hypertension: Secondary | ICD-10-CM | POA: Insufficient documentation

## 2013-01-17 LAB — BASIC METABOLIC PANEL
BUN: 19 mg/dL (ref 6–23)
Calcium: 9.7 mg/dL (ref 8.4–10.5)
Chloride: 99 mEq/L (ref 96–112)
Creatinine, Ser: 0.9 mg/dL (ref 0.50–1.10)
GFR calc non Af Amer: 73 mL/min — ABNORMAL LOW (ref >90)
Glucose, Bld: 95 mg/dL (ref 70–99)
Potassium: 3.6 mEq/L (ref 3.5–5.1)

## 2013-01-17 LAB — CBC
HCT: 39.5 % (ref 36.0–46.0)
Hemoglobin: 13.3 g/dL (ref 12.0–15.0)
MCHC: 33.7 g/dL (ref 30.0–36.0)
MCV: 84.2 fL (ref 78.0–100.0)
WBC: 7.2 10*3/uL (ref 4.0–10.5)

## 2013-01-17 LAB — TROPONIN I
Troponin I: <0.3 ng/mL (ref <0.30)
Troponin I: <0.3 ng/mL (ref <0.30)

## 2013-01-17 LAB — APTT: aPTT: 32 seconds (ref 24–37)

## 2013-01-17 LAB — PROTIME-INR: INR: 0.98 (ref 0.00–1.49)

## 2013-01-17 MED ORDER — TELMISARTAN-HCTZ 80-12.5 MG PO TABS: 1.0000 | ORAL_TABLET | Freq: Every day | ORAL | Status: DC

## 2013-01-17 MED ORDER — CLONIDINE HCL 0.1 MG PO TABS: 0.1000 mg | ORAL_TABLET | Freq: Two times a day (BID) | ORAL | Status: DC

## 2013-01-17 MED ORDER — ASPIRIN 81 MG PO CHEW
324.0000 mg | CHEWABLE_TABLET | Freq: Once | ORAL | Status: AC
  Administered 2013-01-17: 324 mg via ORAL

## 2013-01-17 MED ORDER — NITROGLYCERIN 2 % TD OINT
1.0000 [in_us] | TOPICAL_OINTMENT | Freq: Once | TRANSDERMAL | Status: AC
  Administered 2013-01-17: 1 [in_us] via TOPICAL
  Filled 2013-01-17: qty 1

## 2013-01-17 MED ORDER — CLONIDINE HCL 0.1 MG PO TABS
0.1000 mg | ORAL_TABLET | Freq: Two times a day (BID) | ORAL | Status: DC
  Administered 2013-01-17: 0.1 mg via ORAL
  Filled 2013-01-17: qty 1

## 2013-01-17 MED ORDER — ASPIRIN 81 MG PO CHEW
324.0000 mg | CHEWABLE_TABLET | Freq: Once | ORAL | Status: DC
  Filled 2013-01-17: qty 4

## 2013-01-17 MED ORDER — SODIUM CHLORIDE 0.9 % IV SOLN
1000.0000 mL | INTRAVENOUS | Status: DC
  Administered 2013-01-17: 1000 mL via INTRAVENOUS

## 2013-01-17 MED ORDER — METOPROLOL TARTRATE 50 MG PO TABS
50.0000 mg | ORAL_TABLET | Freq: Once | ORAL | Status: AC
  Administered 2013-01-17: 50 mg via ORAL
  Filled 2013-01-17: qty 1

## 2013-01-17 NOTE — ED Notes (Signed)
Micardis not available in [facility], MD ordered Metoprolol, given instead

## 2013-01-17 NOTE — ED Provider Notes (Signed)
CSN: [id number]     Arrival date & time 01/17/13  1717 History   First MD Initiated Contact with Patient 01/17/13 1729     Chief Complaint  Patient presents with  . Chest Pain    HPI Comments: She has been having pain in her chest for the last week.  The pain is substernal and radiates to her back and fingers.  It seems to be worse at night but today she had a few episodes so she came in to be checked.    Patient is a 52 y.o. female presenting with chest pain. The history is provided by the patient.  Chest Pain Pain location:  Substernal area Pain quality: sharp   Onset quality:  Sudden Duration:  20 minutes Timing:  Intermittent Chronicity:  New Context: not breathing and no movement   Ineffective treatments:  None tried Associated symptoms: back pain, cough and nausea   Associated symptoms: no abdominal pain, no fever and no shortness of breath   Risk factors: hypertension   Risk factors: no coronary artery disease, no prior DVT/PE and no smoking   Pt's BP is usually high in the 197/110 range.   PCP is Dr Juluis Pitch.  Pt ran out of  micardis 7 months ago and has been only taking her clonidine.   Pt did have a stress test one year ago. Past Medical History  Diagnosis Date  . Hypertension   . Allergy    Past Surgical History  Procedure Laterality Date  . Abdominal hysterectomy     Family History  Problem Relation Age of Onset  . Diabetes Mother   . Hypertension Mother   . Heart attack Mother   . Hyperlipidemia Mother   . Heart disease Mother   . Hyperlipidemia Sister   . Hypertension Sister   . Sudden death Neg Hx    History  Substance Use Topics  . Smoking status: Never Smoker   . Smokeless tobacco: Not on file  . Alcohol Use: Yes   OB History   Grav Para Term Preterm Abortions TAB SAB Ect Mult Living                 Review of Systems  Constitutional: Negative for fever.  Respiratory: Positive for cough. Negative for shortness of breath.   Cardiovascular:  Positive for chest pain.  Gastrointestinal: Positive for nausea. Negative for abdominal pain.  Musculoskeletal: Positive for back pain.    Allergies  Review of patient's allergies indicates no known allergies.  Home Medications   Current Outpatient Rx  Name  Route  Sig  Dispense  Refill  . cloNIDine (CATAPRES) 0.1 MG tablet   Oral   Take 1 tablet (0.1 mg total) by mouth 2 (two) times daily.   60 tablet   0   . doxycycline (VIBRA-TABS) 100 MG tablet   Oral   Take 1 tablet (100 mg total) by mouth 2 (two) times daily.   20 tablet   0   . HYDROcodone-acetaminophen (NORCO/VICODIN) 5-325 MG per tablet   Oral   Take 1 tablet by mouth every 6 (six) hours as needed for pain.   20 tablet   0   . telmisartan-hydrochlorothiazide (MICARDIS HCT) 80-12.5 MG per tablet   Oral   Take 1 tablet by mouth daily.   30 tablet   1    BP 190/93  Pulse 55  Temp(Src) 98 F (36.7 C) (Oral)  Resp 20  Ht 5' 3.5" (1.613 m)  Wt 229  lb (103.874 kg)  BMI 39.92 kg/m2  SpO2 98% Physical Exam  Nursing note and vitals reviewed. Constitutional: She appears well-developed and well-nourished. No distress.  HENT:  Head: Normocephalic and atraumatic.  Right Ear: External ear normal.  Left Ear: External ear normal.  Eyes: Conjunctivae are normal. Right eye exhibits no discharge. Left eye exhibits no discharge. No scleral icterus.  Neck: Neck supple. No tracheal deviation present.  Cardiovascular: Normal rate, regular rhythm and intact distal pulses.   Pulmonary/Chest: Effort normal and breath sounds normal. No stridor. No respiratory distress. She has no wheezes. She has no rales.  Abdominal: Soft. Bowel sounds are normal. She exhibits no distension. There is no tenderness. There is no rebound and no guarding.  Musculoskeletal: She exhibits no edema and no tenderness.  Neurological: She is alert. She has normal strength. No sensory deficit. Cranial nerve deficit:  no gross defecits noted. She exhibits  normal muscle tone. She displays no seizure activity. Coordination normal.  Skin: Skin is warm and dry. No rash noted.  Psychiatric: She has a normal mood and affect.    ED Course  Procedures (including critical care time) Labs Review Labs Reviewed  BASIC METABOLIC PANEL - Abnormal; Notable for the following:    GFR calc non Af Amer 73 (*)    GFR calc Af Amer 85 (*)    All other components within normal limits  CBC  TROPONIN I  PROTIME-INR  APTT  TROPONIN I   Imaging Review Dg Chest 2 View  01/17/2013   CLINICAL DATA:  Left chest and arm pain.  Hypertension.  EXAM: CHEST  2 VIEW  COMPARISON:  12/1908  FINDINGS: Mild to moderate cardiomegaly remains stable. Both lungs are clear. No evidence of congestive heart failure or other infiltrate. No evidence of pleural effusion. No mass or lymphadenopathy identified.  IMPRESSION: Stable cardiomegaly.  No active lung disease.   Electronically Signed   By: Myles Rosenthal M.D.   On: 01/17/2013 18:57    EKG Interpretation     Ventricular Rate:  68 PR Interval:  160 QRS Duration: 86 QT Interval:  406 QTC Calculation: 431 R Axis:   54 Text Interpretation:  Normal sinus rhythm Cannot rule out Anterior infarct , age undetermined Abnormal ECG No significant change since last tracing           Chart reviewed:  Stress echo 2010, no evidence of ischemia MDM   1. Chest pain   2. HTN (hypertension)      Symptoms atypical for heart disease.  Heart score of 3, low risk.  Discussed admission and further evaluation.  Pt does not want to be admitted.  She did have a negative stress echo in the past although that has been several years.  Will have pt stay for another troponin.  If negative, will refill her BP med.  Pt will follow up with her PCP this week for further eval, outpt stress testing.  Warning signs discussed    Celene Kras, MD 01/17/13 2224

## 2013-01-17 NOTE — ED Notes (Signed)
Left arm cold, painful and numb. Pain goes into her shoulder and left chest.

## 2013-01-25 ENCOUNTER — Other Ambulatory Visit: Payer: Self-pay

## 2013-01-25 DIAGNOSIS — Z1231 Encounter for screening mammogram for malignant neoplasm of breast: Secondary | ICD-10-CM

## 2013-02-24 ENCOUNTER — Ambulatory Visit: Payer: Managed Care, Other (non HMO)

## 2013-02-24 ENCOUNTER — Ambulatory Visit
Admission: RE | Admit: 2013-02-24 | Discharge: 2013-02-24 | Disposition: A | Discharge status: Home / Self Care | Payer: Managed Care, Other (non HMO) | Source: Ambulatory Visit

## 2013-02-24 DIAGNOSIS — Z1231 Encounter for screening mammogram for malignant neoplasm of breast: Secondary | ICD-10-CM

## 2013-07-27 ENCOUNTER — Telehealth: Payer: Self-pay

## 2013-07-27 NOTE — Telephone Encounter (Signed)
Patient is needing to get enough bp medicine to last her until she can come in for her appointment on 5/29 with Dr Marin Comment.   Best#: [phone]

## 2013-07-28 ENCOUNTER — Encounter: Payer: Managed Care, Other (non HMO) | Admitting: Family Medicine

## 2013-07-28 MED ORDER — CLONIDINE HCL 0.1 MG PO TABS: 0.1000 mg | ORAL_TABLET | Freq: Two times a day (BID) | ORAL | Status: DC

## 2013-07-28 MED ORDER — TELMISARTAN-HCTZ 80-12.5 MG PO TABS: 1.0000 | ORAL_TABLET | Freq: Every day | ORAL | Status: DC

## 2013-07-28 NOTE — Telephone Encounter (Signed)
Pt verified she needs both BP meds until her appt sent to [place]. Done.

## 2013-08-04 ENCOUNTER — Encounter: Payer: Self-pay | Admitting: Family Medicine

## 2013-08-04 ENCOUNTER — Ambulatory Visit (INDEPENDENT_AMBULATORY_CARE_PROVIDER_SITE_OTHER): Payer: Managed Care, Other (non HMO) | Admitting: Family Medicine

## 2013-08-04 VITALS — BP 160/89 | HR 57 | Temp 98.4°F | Resp 16 | Ht 63.0 in | Wt 235.0 lb

## 2013-08-04 DIAGNOSIS — Z0000 Encounter for general adult medical examination without abnormal findings: Secondary | ICD-10-CM

## 2013-08-04 DIAGNOSIS — Z13220 Encounter for screening for lipoid disorders: Secondary | ICD-10-CM

## 2013-08-04 DIAGNOSIS — Z1211 Encounter for screening for malignant neoplasm of colon: Secondary | ICD-10-CM

## 2013-08-04 DIAGNOSIS — I10 Essential (primary) hypertension: Secondary | ICD-10-CM

## 2013-08-04 DIAGNOSIS — R32 Unspecified urinary incontinence: Secondary | ICD-10-CM

## 2013-08-04 LAB — COMPLETE METABOLIC PANEL WITHOUT GFR
Alkaline Phosphatase: 91 U/L (ref 39–117)
BUN: 16 mg/dL (ref 6–23)
Total Bilirubin: 0.6 mg/dL (ref 0.2–1.2)

## 2013-08-04 LAB — COMPLETE METABOLIC PANEL WITH GFR
ALT: 12 U/L (ref 0–35)
AST: 13 U/L (ref 0–37)
Albumin: 3.9 g/dL (ref 3.5–5.2)
CO2: 27 mEq/L (ref 19–32)
Calcium: 9.2 mg/dL (ref 8.4–10.5)
Chloride: 104 mEq/L (ref 96–112)
Creat: 0.88 mg/dL (ref 0.50–1.10)
GFR, Est African American: 89 mL/min
GFR, Est Non African American: 77 mL/min
Glucose, Bld: 98 mg/dL (ref 70–99)
Potassium: 3.8 mEq/L (ref 3.5–5.3)
Sodium: 138 mEq/L (ref 135–145)
Total Protein: 6.4 g/dL (ref 6.0–8.3)

## 2013-08-04 LAB — POCT URINALYSIS DIPSTICK
Bilirubin, UA: NEGATIVE
Blood, UA: NEGATIVE
Glucose, UA: NEGATIVE
Ketones, UA: NEGATIVE
Leukocytes, UA: NEGATIVE
Nitrite, UA: NEGATIVE
Protein, UA: NEGATIVE
Spec Grav, UA: 1.02
Urobilinogen, UA: 0.2
pH, UA: 5.5

## 2013-08-04 LAB — LIPID PANEL
Cholesterol: 197 mg/dL (ref 0–200)
HDL: 80 mg/dL (ref >39)
LDL Cholesterol: 106 mg/dL — ABNORMAL HIGH (ref 0–99)
Total CHOL/HDL Ratio: 2.5 Ratio
Triglycerides: 53 mg/dL (ref <150)
VLDL: 11 mg/dL (ref 0–40)

## 2013-08-04 LAB — CBC
HCT: 35.7 % — ABNORMAL LOW (ref 36.0–46.0)
Hemoglobin: 12.1 g/dL (ref 12.0–15.0)
MCH: 27.8 pg (ref 26.0–34.0)
MCHC: 33.9 g/dL (ref 30.0–36.0)
MCV: 81.9 fL (ref 78.0–100.0)
Platelets: 176 10*3/uL (ref 150–400)
RBC: 4.36 MIL/uL (ref 3.87–5.11)
RDW: 13.5 % (ref 11.5–15.5)
WBC: 4.3 10*3/uL (ref 4.0–10.5)

## 2013-08-04 LAB — TSH: TSH: 1.434 u[IU]/mL (ref 0.350–4.500)

## 2013-08-04 MED ORDER — CLONIDINE HCL 0.1 MG PO TABS: 0.1000 mg | ORAL_TABLET | Freq: Every day | ORAL | Status: DC

## 2013-08-04 MED ORDER — TELMISARTAN-HCTZ 80-12.5 MG PO TABS: 1.0000 | ORAL_TABLET | Freq: Every day | ORAL | Status: DC

## 2013-08-04 MED ORDER — OXYBUTYNIN CHLORIDE ER 5 MG PO TB24
5.0000 mg | ORAL_TABLET | Freq: Every day | ORAL | Status: DC
Start: 2013-08-04 — End: 2014-07-02

## 2013-08-04 NOTE — Progress Notes (Signed)
Chief Complaint:  Chief Complaint  Patient presents with  . Annual Exam  . Gynecologic Exam  . Medication Refill    HPI: Audrey Farley is a 53 y.o. female who is here for  CPE with pap She is UTD on her mammogram and pap. Se occ does breast exams int he shower.  Mammogram 01/2013-normal Last pap was 02/2013, no prior abnormal paps OB/gyn [facility], Dr Molli Posey [id number] Hysterectomy-11 years ago, she had it for fibroids. She states they took out her cervix but left her ovaries.   HOWEVER I looked through the surgical pathology and that  Is all we have since [facility] do not use EPIC The results of hsyterectomy from in 2006 from  her surgical path, it was benign, a cervix was NOT included in the specimen      Past Medical History  Diagnosis Date  . Hypertension   . Allergy    Past Surgical History  Procedure Laterality Date  . Abdominal hysterectomy     History   Social History  . Marital Status: Single    Spouse Name: N/A    Number of Children: N/A  . Years of Education: N/A   Social History Main Topics  . Smoking status: Never Smoker   . Smokeless tobacco: None  . Alcohol Use: Yes  . Drug Use: No  . Sexual Activity: Yes   Other Topics Concern  . None   Social History Narrative  . None   Family History  Problem Relation Age of Onset  . Diabetes Mother   . Hypertension Mother   . Heart attack Mother   . Hyperlipidemia Mother   . Heart disease Mother   . Hyperlipidemia Sister   . Hypertension Sister   . Multiple sclerosis Sister   . Sudden death Neg Hx   . Mental illness Brother   . Epilepsy Brother   . Diabetes Sister    No Known Allergies Prior to Admission medications   Medication Sig Start Date End Date Taking? Authorizing Provider  cloNIDine (CATAPRES) 0.1 MG tablet Take 1 tablet (0.1 mg total) by mouth 2 (two) times daily. 07/28/13  Yes Mancel Bale, PA-C  oxybutynin (DITROPAN-XL) 5 MG 24 hr tablet  Take 5 mg by mouth at bedtime.   Yes Historical Provider, MD  telmisartan-hydrochlorothiazide (MICARDIS HCT) 80-12.5 MG per tablet Take 1 tablet by mouth daily. 07/28/13  Yes Sarah Alleen Borne, PA-C     ROS: The patient denies fevers, chills, night sweats, unintentional weight loss, chest pain, palpitations, wheezing, dyspnea on exertion, nausea, vomiting, abdominal pain, dysuria, hematuria, melena, numbness, weakness, or tingling.   All other systems have been reviewed and were otherwise negative with the exception of those mentioned in the HPI and as above.    PHYSICAL EXAM: Filed Vitals:   08/04/13 0840  BP: 160/89  Pulse: 57  Temp: 98.4 F (36.9 C)  Resp: 16   Filed Vitals:   08/04/13 0840  Height: 5\' 3"  (1.6 m)  Weight: 235 lb (106.595 kg)   Body mass index is 41.64 kg/(m^2).  General: Alert, no acute distress HEENT:  Normocephalic, atraumatic, oropharynx patent. EOMI, PERRLA Cardiovascular:  Regular rate and rhythm, no rubs murmurs or gallops.  No Carotid bruits, radial pulse intact. No pedal edema.  Respiratory: Clear to auscultation bilaterally.  No wheezes, rales, or rhonchi.  No cyanosis, no use of accessory musculature GI: No organomegaly, abdomen is soft and non-tender, positive bowel  sounds.  No masses. Skin: No rashes. Neurologic: Facial musculature symmetric. Psychiatric: Patient is appropriate throughout our interaction. Lymphatic: No cervical lymphadenopathy Musculoskeletal: Gait intact. GU-still has cervix, looks normal ( I will have to check it this is the case with her Dr Toma Deiters who was at [facility]), no masses or lesions.  Breast exam -normal Defer rectal exam since refer for colonsocopy   LABS: Results for orders placed in visit on 08/04/13  POCT URINALYSIS DIPSTICK      Result Value Ref Range   Color, UA yellow     Clarity, UA clear     Glucose, UA neg     Bilirubin, UA neg     Ketones, UA neg     Spec Grav, UA 1.020     Blood, UA neg      pH, UA 5.5     Protein, UA neg     Urobilinogen, UA 0.2     Nitrite, UA neg     Leukocytes, UA Negative       EKG/XRAY:   Primary read interpreted by Dr. Marin Comment at [facility].   ASSESSMENT/PLAN: Encounter Diagnoses  Name Primary?  . Routine general medical examination at a health care facility Yes  . Encounter for screening colonoscopy   . Screening for hyperlipidemia   . HTN (hypertension)   . Urinary incontinence    Need Dr Molli Posey notes, patient will get t records for [place] [facility] from [facility], she states she has had hysterectomy with removal of cervix but I see a cervix on exam No pap today since she states she ahs had no priro abnormal pap and has  Had normal pap in 2014. Will have to get records Annual Labs pending Will get mammogram  F/u in 1 year in 01/2014 Refer for screening colonsocpy F/u prn  Gross sideeffects, risk and benefits, and alternatives of medications d/w patient. Patient is aware that all medications have potential sideeffects and we are unable to predict every sideeffect or drug-drug interaction that may occur.  Glenford Bayley, DO 08/04/2013 9:49 AM

## 2013-08-08 ENCOUNTER — Encounter: Payer: Self-pay | Admitting: Family Medicine

## 2013-08-14 ENCOUNTER — Telehealth: Payer: Self-pay

## 2013-08-14 NOTE — Telephone Encounter (Signed)
Pt has questions regarding her rx for oxybutynin (DITROPAN-XL) 5 MG 24 hr tablet. [place]

## 2013-08-15 NOTE — Telephone Encounter (Signed)
There is no 2.5mg  oxybutynin pill.  The immediate  Release pills can be cut, but the XL pill that she is taking should NOT be cut.  We can switch to the immediate release version which can be cut, but she may need to take twice daily depending on symptom control

## 2013-08-15 NOTE — Telephone Encounter (Signed)
Spoke to patient. She declines having the immediate release version called in because she states that the ones she just got were $158 and she will just take them.  Advised patient to call us back with any further questions or concerns.

## 2013-08-15 NOTE — Telephone Encounter (Signed)
Pt states that she only takes half of the oxybutynin. The pills she received for the refill are a different color- advised pt they are the same medication just from a different manufacturer. She may rtn to pharmacy to be sure. She states she has to break them in half and the new one is more difficult. Advised her to get a pill cutter. Can we prescribe 2.5mg ?

## 2013-08-18 ENCOUNTER — Ambulatory Visit (INDEPENDENT_AMBULATORY_CARE_PROVIDER_SITE_OTHER): Payer: Managed Care, Other (non HMO) | Admitting: Family Medicine

## 2013-08-18 ENCOUNTER — Encounter: Payer: Self-pay | Admitting: Family Medicine

## 2013-08-18 VITALS — BP 120/76 | HR 66 | Temp 98.4°F | Resp 16 | Ht 63.0 in | Wt 232.8 lb

## 2013-08-18 DIAGNOSIS — M25569 Pain in unspecified knee: Secondary | ICD-10-CM

## 2013-08-18 DIAGNOSIS — M25562 Pain in left knee: Secondary | ICD-10-CM

## 2013-08-18 DIAGNOSIS — I10 Essential (primary) hypertension: Secondary | ICD-10-CM

## 2013-08-18 MED ORDER — HYDROCODONE-ACETAMINOPHEN 5-325 MG PO TABS: 1.0000 | ORAL_TABLET | Freq: Four times a day (QID) | ORAL | Status: DC | PRN

## 2013-08-18 MED ORDER — MELOXICAM 7.5 MG PO TABS: 7.5000 mg | ORAL_TABLET | Freq: Every day | ORAL | Status: DC

## 2013-08-18 NOTE — Progress Notes (Signed)
Chief Complaint:  Chief Complaint  Patient presents with  . 2 weeks f/u    HPI: Audrey Farley is a 53 y.o. female who is here for: 1. HTN recheck. Doing well, no SEs. No CP, no dizziness 2. Left knee pain-has had over 3 week history of worsening knee pain and swelling, feels unstable on it, does not have pop or click but feels pain when she tries to bend it. She has ahd arthroscopy and debridement of knee for meniscus in the past. She ahs been taking advil/aleve without relief. NKI. She has a brace at home. Has not worn it.   Past Medical History  Diagnosis Date  . Hypertension   . Allergy   . Pap smear for cervical cancer screening     2014--normal   Past Surgical History  Procedure Laterality Date  . Abdominal hysterectomy      still has cervix   History   Social History  . Marital Status: Single    Spouse Name: N/A    Number of Children: N/A  . Years of Education: N/A   Social History Main Topics  . Smoking status: Never Smoker   . Smokeless tobacco: None  . Alcohol Use: Yes  . Drug Use: No  . Sexual Activity: Yes   Other Topics Concern  . None   Social History Narrative  . None   Family History  Problem Relation Age of Onset  . Diabetes Mother   . Hypertension Mother   . Heart attack Mother   . Hyperlipidemia Mother   . Heart disease Mother   . Hyperlipidemia Sister   . Hypertension Sister   . Multiple sclerosis Sister   . Sudden death Neg Hx   . Mental illness Brother   . Epilepsy Brother   . Diabetes Sister    No Known Allergies Prior to Admission medications   Medication Sig Start Date End Date Taking? Authorizing Provider  cloNIDine (CATAPRES) 0.1 MG tablet Take 1 tablet (0.1 mg total) by mouth daily. 08/04/13  Yes Rithvik Orcutt P Honest Safranek, DO  oxybutynin (DITROPAN-XL) 5 MG 24 hr tablet Take 1 tablet (5 mg total) by mouth at bedtime. 08/04/13  Yes Carolynne Schuchard P Mattalynn Crandle, DO  telmisartan-hydrochlorothiazide (MICARDIS HCT) 80-12.5 MG per tablet Take 1 tablet by  mouth daily. 08/04/13   Endre Coutts P Uziah Sorter, DO     ROS: The patient denies fevers, chills, night sweats, unintentional weight loss, chest pain, palpitations, wheezing, dyspnea on exertion, nausea, vomiting, abdominal pain, dysuria, hematuria, melena, numbness, weakness, or tingling.   All other systems have been reviewed and were otherwise negative with the exception of those mentioned in the HPI and as above.    PHYSICAL EXAM: Filed Vitals:   08/18/13 0852  BP: 120/76  Pulse: 66  Temp: 98.4 F (36.9 C)  Resp: 16   Filed Vitals:   08/18/13 0852  Height: 5\' 3"  (1.6 m)  Weight: 232 lb 12.8 oz (105.597 kg)   Body mass index is 41.25 kg/(m^2).  General: Alert, no acute distress HEENT:  Normocephalic, atraumatic, oropharynx patent. EOMI, PERRLA Cardiovascular:  Regular rate and rhythm, no rubs murmurs or gallops.  No Carotid bruits, radial pulse intact. No pedal edema.  Respiratory: Clear to auscultation bilaterally.  No wheezes, rales, or rhonchi.  No cyanosis, no use of accessory musculature GI: No organomegaly, abdomen is soft and non-tender, positive bowel sounds.  No masses. Skin: No rashes. Neurologic: Facial musculature symmetric. Psychiatric: Patient is appropriate throughout our  interaction. Lymphatic: No cervical lymphadenopathy Musculoskeletal: Gait antalgic intiially on getting up and standing, then nl Decrease ROM due to pain Tender diffuse anterior knee No effusion but hard to tell since she is obses No warmth Neg lachman, neg ant drawer, ne mcmurray   LABS: Results for orders placed in visit on 08/04/13  CBC      Result Value Ref Range   WBC 4.3  4.0 - 10.5 K/uL   RBC 4.36  3.87 - 5.11 MIL/uL   Hemoglobin 12.1  12.0 - 15.0 g/dL   HCT 35.7 (*) 36.0 - 46.0 %   MCV 81.9  78.0 - 100.0 fL   MCH 27.8  26.0 - 34.0 pg   MCHC 33.9  30.0 - 36.0 g/dL   RDW 13.5  11.5 - 15.5 %   Platelets 176  150 - 400 K/uL  COMPLETE METABOLIC PANEL WITH GFR      Result Value Ref Range    Sodium 138  135 - 145 mEq/L   Potassium 3.8  3.5 - 5.3 mEq/L   Chloride 104  96 - 112 mEq/L   CO2 27  19 - 32 mEq/L   Glucose, Bld 98  70 - 99 mg/dL   BUN 16  6 - 23 mg/dL   Creat 0.88  0.50 - 1.10 mg/dL   Total Bilirubin 0.6  0.2 - 1.2 mg/dL   Alkaline Phosphatase 91  39 - 117 U/L   AST 13  0 - 37 U/L   ALT 12  0 - 35 U/L   Total Protein 6.4  6.0 - 8.3 g/dL   Albumin 3.9  3.5 - 5.2 g/dL   Calcium 9.2  8.4 - 10.5 mg/dL   GFR, Est African American 89     GFR, Est Non African American 77    TSH      Result Value Ref Range   TSH 1.434  0.350 - 4.500 uIU/mL  LIPID PANEL      Result Value Ref Range   Cholesterol 197  0 - 200 mg/dL   Triglycerides 53  <150 mg/dL   HDL 80  >39 mg/dL   Total CHOL/HDL Ratio 2.5     VLDL 11  0 - 40 mg/dL   LDL Cholesterol 106 (*) 0 - 99 mg/dL  POCT URINALYSIS DIPSTICK      Result Value Ref Range   Color, UA yellow     Clarity, UA clear     Glucose, UA neg     Bilirubin, UA neg     Ketones, UA neg     Spec Grav, UA 1.020     Blood, UA neg     pH, UA 5.5     Protein, UA neg     Urobilinogen, UA 0.2     Nitrite, UA neg     Leukocytes, UA Negative       EKG/XRAY:   Primary read interpreted by Dr. Marin Comment at [facility].   ASSESSMENT/PLAN: Encounter Diagnoses  Name Primary?  . HTN (hypertension) Yes  . Left knee pain    C/w HTN meds Rx mobic and norco, f/u with ortho for knee pain if still persists, no xrays today since NKI She will c/w her oxybutynin ER 1/2-1 tab daily for incontinence F/u prn  Gross sideeffects, risk and benefits, and alternatives of medications d/w patient. Patient is aware that all medications have potential sideeffects and we are unable to predict every sideeffect or drug-drug interaction that may occur.  Camp Gopal  [place], DO 08/21/2013 5:07 AM

## 2013-09-04 ENCOUNTER — Encounter: Payer: Self-pay | Admitting: Family Medicine

## 2013-11-10 ENCOUNTER — Telehealth: Payer: Self-pay

## 2013-11-10 NOTE — Telephone Encounter (Signed)
Patient came to 102 to drop off some forms to be filled out. She was seen by Dr. Marin Comment recently and her daughter as well. I put the forms in [employer] box at nurses station. Please call when ready. Thank you

## 2013-11-10 NOTE — Telephone Encounter (Signed)
Patient of Dr. Marin Comment- I have put the forms in her box.

## 2013-11-15 NOTE — Telephone Encounter (Signed)
Paperwork done and patient was notified by Rodi

## 2014-03-10 ENCOUNTER — Other Ambulatory Visit: Payer: Self-pay | Admitting: Family Medicine

## 2014-03-22 IMAGING — MG MM SCREEN MAMMOGRAM BILATERAL
4 series · 4 of 4 positions shown · non-contrast
Comparison: Previous exam(s).

CLINICAL DATA: Screening.

EXAM:
DIGITAL SCREENING BILATERAL MAMMOGRAM WITH CAD

[R CC]
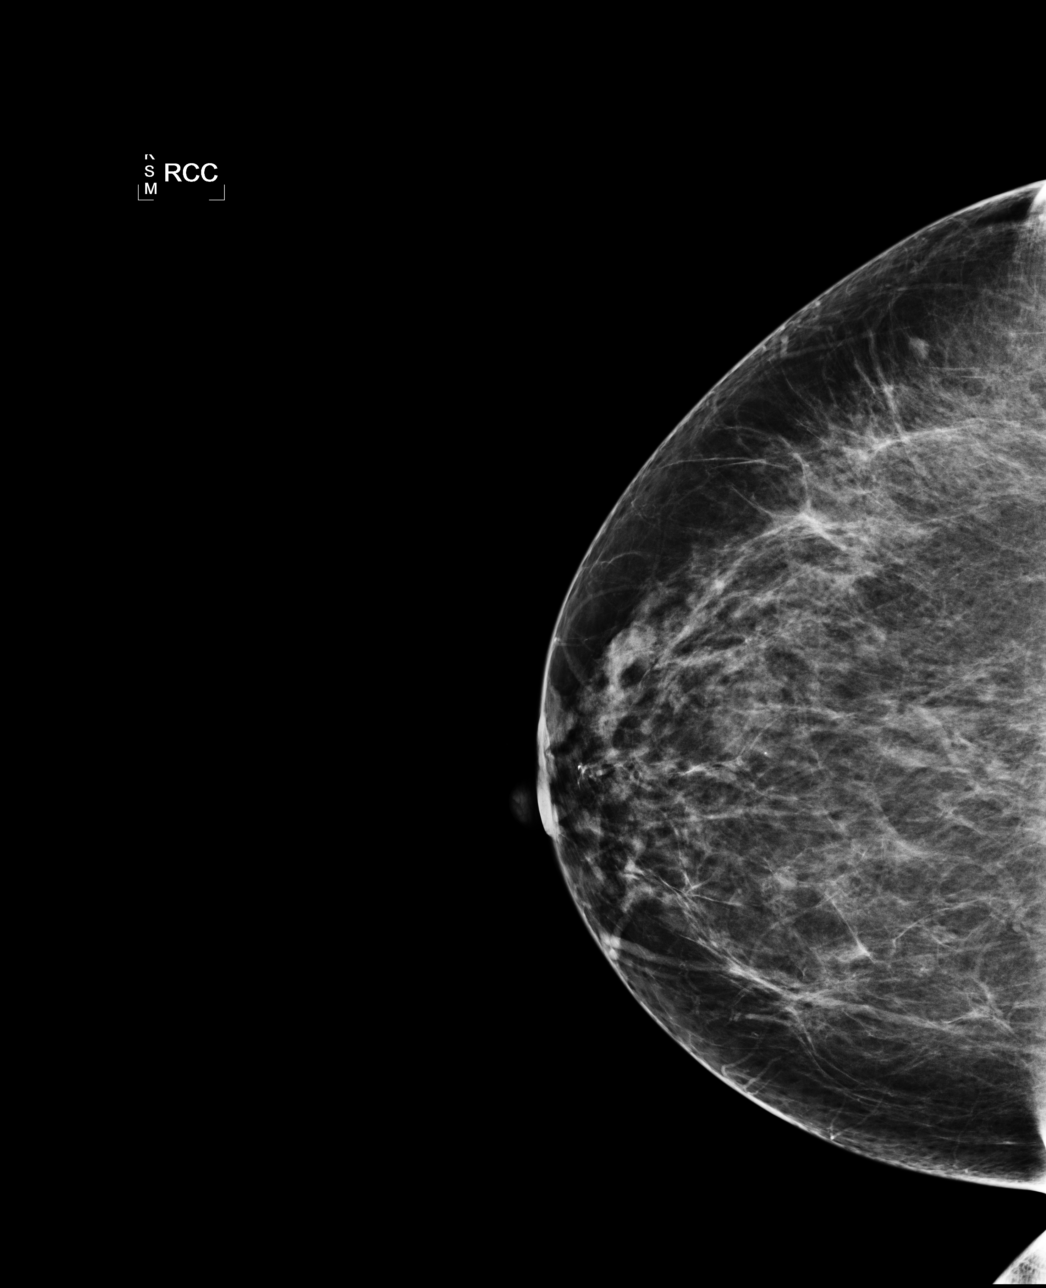

[L CC]
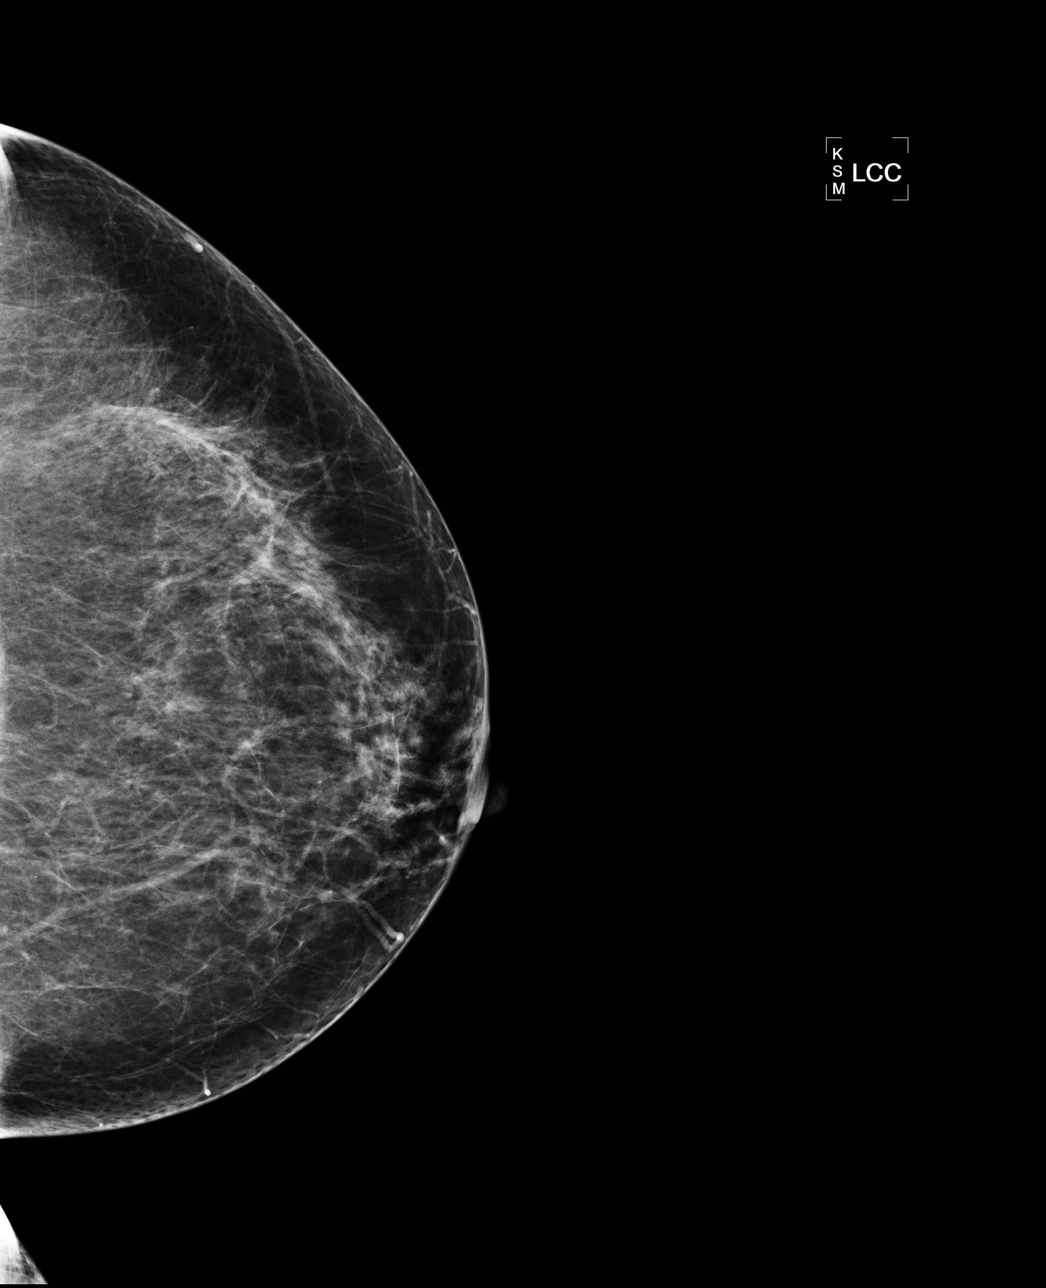

[L MLO]
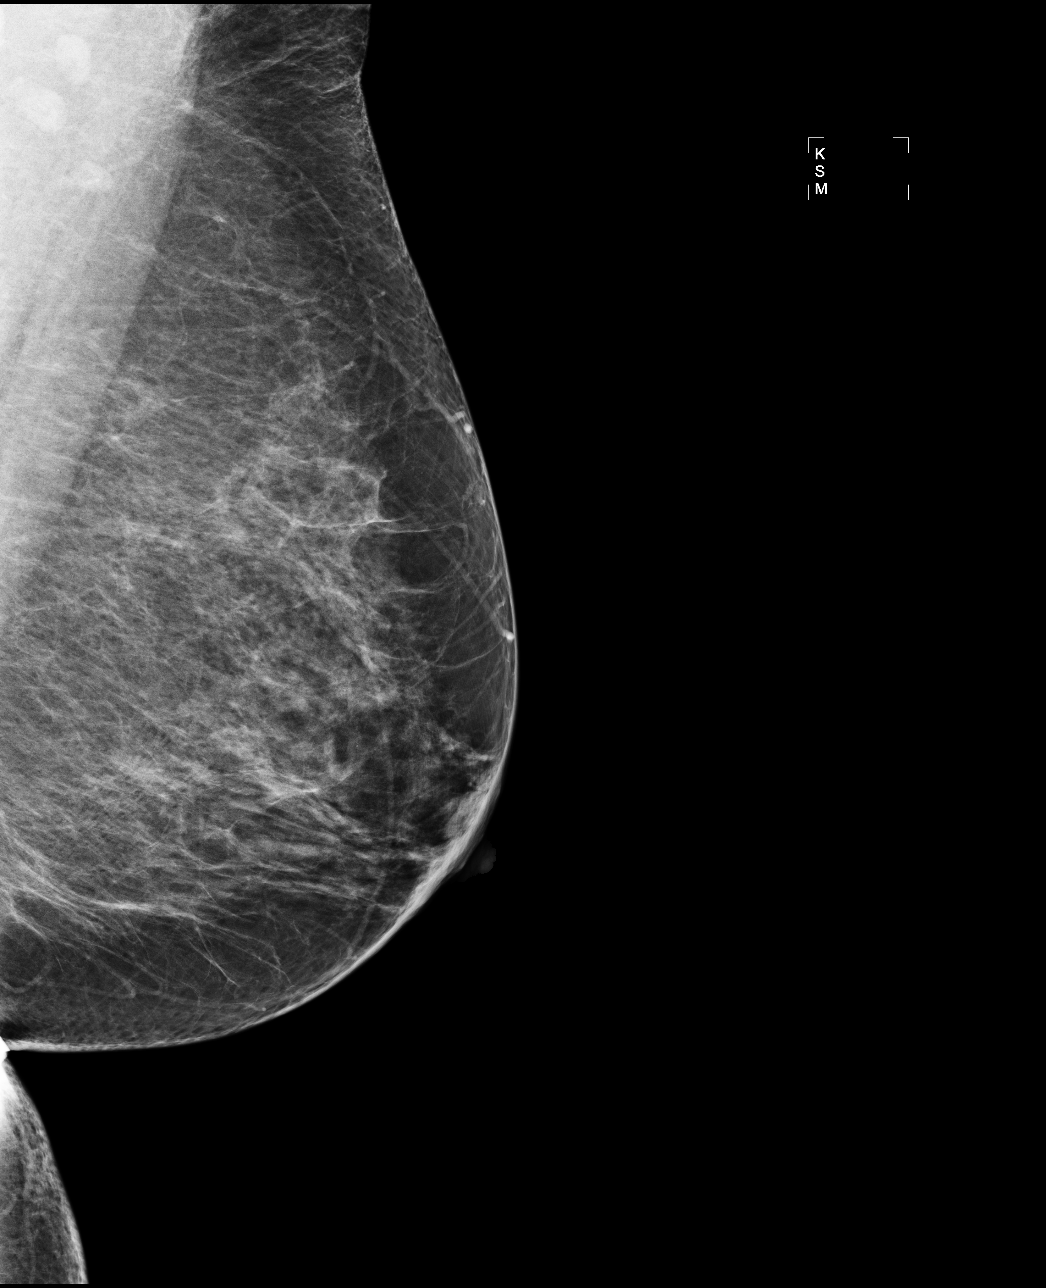

[R MLO]
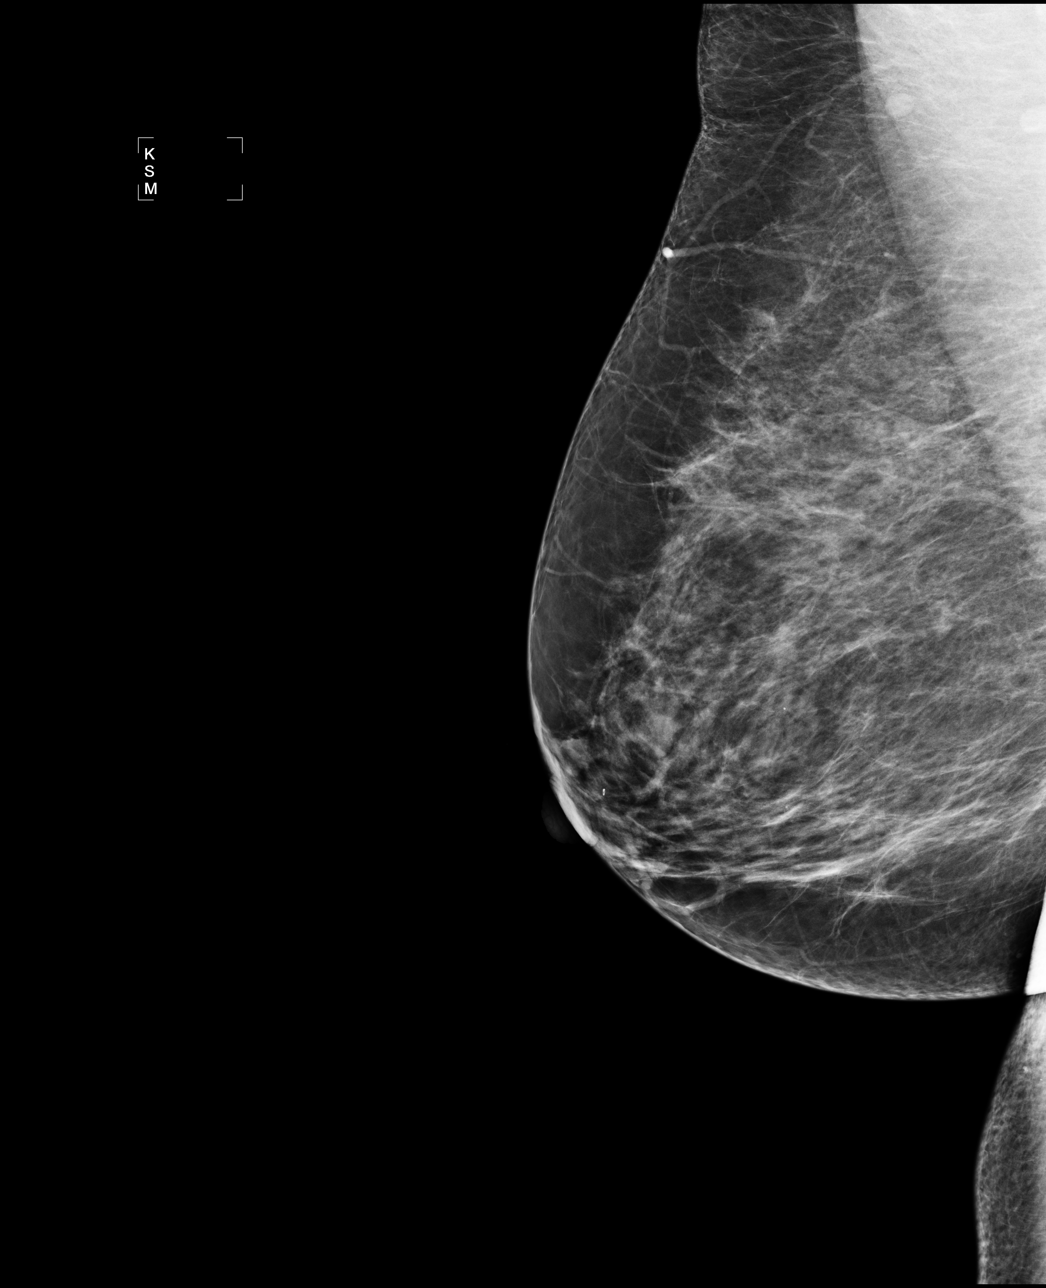

[4 of 4 positions shown; findings below may reference images not displayed]

ACR Breast Density Category b: There are scattered areas of
fibroglandular density.
FINDINGS: There are no findings suspicious for malignancy. Images were
processed with CAD.
IMPRESSION: No mammographic evidence of malignancy. A result letter of this
screening mammogram will be mailed directly to the patient.

RECOMMENDATION:
Screening mammogram in one year. (Code:[id number])

BI-RADS CATEGORY  1: Negative

## 2014-05-01 ENCOUNTER — Other Ambulatory Visit: Payer: Self-pay | Admitting: Family Medicine

## 2014-06-03 ENCOUNTER — Other Ambulatory Visit: Payer: Self-pay | Admitting: Physician Assistant

## 2014-06-06 ENCOUNTER — Telehealth: Payer: Self-pay

## 2014-06-06 MED ORDER — CLONIDINE HCL 0.1 MG PO TABS: ORAL_TABLET | ORAL | Status: DC

## 2014-06-06 NOTE — Telephone Encounter (Signed)
Spoke to pt who reported that she just needs a RF of clonidine to hold her until she sees Dr Marin Comment. I sent in a 2 wk supply.

## 2014-06-06 NOTE — Telephone Encounter (Signed)
Pt states she will come in and see Dr. Marin Comment on Thursday 4/7 in the walk in center, and would like to know if her bp can be refilled in the mean time. Please advise

## 2014-07-02 ENCOUNTER — Other Ambulatory Visit: Payer: Self-pay | Admitting: Family Medicine

## 2014-07-02 DIAGNOSIS — Z13220 Encounter for screening for lipoid disorders: Secondary | ICD-10-CM

## 2014-07-02 DIAGNOSIS — Z1211 Encounter for screening for malignant neoplasm of colon: Secondary | ICD-10-CM

## 2014-07-02 DIAGNOSIS — R32 Unspecified urinary incontinence: Secondary | ICD-10-CM

## 2014-07-02 DIAGNOSIS — Z0000 Encounter for general adult medical examination without abnormal findings: Secondary | ICD-10-CM

## 2014-07-02 MED ORDER — TELMISARTAN-HCTZ 80-12.5 MG PO TABS: 1.0000 | ORAL_TABLET | Freq: Every day | ORAL | Status: DC

## 2014-07-02 MED ORDER — CLONIDINE HCL 0.1 MG PO TABS: ORAL_TABLET | ORAL | Status: DC

## 2014-07-02 MED ORDER — OXYBUTYNIN CHLORIDE ER 5 MG PO TB24: 5.0000 mg | ORAL_TABLET | Freq: Every day | ORAL | Status: DC

## 2014-08-03 ENCOUNTER — Encounter: Payer: Managed Care, Other (non HMO) | Admitting: Family Medicine

## 2014-08-31 ENCOUNTER — Ambulatory Visit (INDEPENDENT_AMBULATORY_CARE_PROVIDER_SITE_OTHER): Payer: Managed Care, Other (non HMO) | Admitting: Family Medicine

## 2014-08-31 ENCOUNTER — Encounter: Payer: Self-pay | Admitting: Family Medicine

## 2014-08-31 VITALS — BP 149/89 | HR 50 | Temp 97.8°F | Resp 16 | Ht 63.5 in | Wt 238.2 lb

## 2014-08-31 DIAGNOSIS — Z1383 Encounter for screening for respiratory disorder NEC: Secondary | ICD-10-CM

## 2014-08-31 DIAGNOSIS — Z13220 Encounter for screening for lipoid disorders: Secondary | ICD-10-CM

## 2014-08-31 DIAGNOSIS — Z0000 Encounter for general adult medical examination without abnormal findings: Secondary | ICD-10-CM | POA: Diagnosis not present

## 2014-08-31 DIAGNOSIS — N6011 Diffuse cystic mastopathy of right breast: Secondary | ICD-10-CM | POA: Diagnosis not present

## 2014-08-31 DIAGNOSIS — R32 Unspecified urinary incontinence: Secondary | ICD-10-CM | POA: Diagnosis not present

## 2014-08-31 DIAGNOSIS — B351 Tinea unguium: Secondary | ICD-10-CM

## 2014-08-31 DIAGNOSIS — Z79899 Other long term (current) drug therapy: Secondary | ICD-10-CM | POA: Diagnosis not present

## 2014-08-31 DIAGNOSIS — Z1329 Encounter for screening for other suspected endocrine disorder: Secondary | ICD-10-CM | POA: Diagnosis not present

## 2014-08-31 DIAGNOSIS — B360 Pityriasis versicolor: Secondary | ICD-10-CM

## 2014-08-31 DIAGNOSIS — N6012 Diffuse cystic mastopathy of left breast: Secondary | ICD-10-CM

## 2014-08-31 DIAGNOSIS — I10 Essential (primary) hypertension: Secondary | ICD-10-CM

## 2014-08-31 DIAGNOSIS — B353 Tinea pedis: Secondary | ICD-10-CM

## 2014-08-31 DIAGNOSIS — Z1211 Encounter for screening for malignant neoplasm of colon: Secondary | ICD-10-CM | POA: Diagnosis not present

## 2014-08-31 DIAGNOSIS — Z1389 Encounter for screening for other disorder: Secondary | ICD-10-CM

## 2014-08-31 DIAGNOSIS — Z23 Encounter for immunization: Secondary | ICD-10-CM

## 2014-08-31 DIAGNOSIS — Z136 Encounter for screening for cardiovascular disorders: Secondary | ICD-10-CM | POA: Diagnosis not present

## 2014-08-31 LAB — CBC
HEMATOCRIT: 34.3 % — AB (ref 36.0–46.0)
Hemoglobin: 11.8 g/dL — ABNORMAL LOW (ref 12.0–15.0)
MCH: 28.5 pg (ref 26.0–34.0)
MCHC: 34.4 g/dL (ref 30.0–36.0)
MCV: 82.9 fL (ref 78.0–100.0)
MPV: 9.5 fL (ref 8.6–12.4)
PLATELETS: 204 10*3/uL (ref 150–400)
RBC: 4.14 MIL/uL (ref 3.87–5.11)
RDW: 13.4 % (ref 11.5–15.5)
WBC: 4.2 10*3/uL (ref 4.0–10.5)

## 2014-08-31 LAB — POCT URINALYSIS DIPSTICK
Bilirubin, UA: NEGATIVE
Glucose, UA: NEGATIVE
Ketones, UA: NEGATIVE
Leukocytes, UA: NEGATIVE
NITRITE UA: NEGATIVE
PH UA: 5.5
Protein, UA: NEGATIVE
RBC UA: NEGATIVE
Spec Grav, UA: 1.02
Urobilinogen, UA: 0.2

## 2014-08-31 MED ORDER — MICONAZOLE NITRATE 2 % EX CREA: 1.0000 "application " | TOPICAL_CREAM | Freq: Two times a day (BID) | CUTANEOUS | Status: DC

## 2014-08-31 MED ORDER — OXYBUTYNIN CHLORIDE ER 5 MG PO TB24: 5.0000 mg | ORAL_TABLET | Freq: Every day | ORAL | Status: DC

## 2014-08-31 MED ORDER — TELMISARTAN-HCTZ 80-12.5 MG PO TABS: 1.0000 | ORAL_TABLET | Freq: Every day | ORAL | Status: DC

## 2014-08-31 MED ORDER — CLONIDINE HCL 0.1 MG PO TABS: 0.1000 mg | ORAL_TABLET | Freq: Two times a day (BID) | ORAL | Status: DC

## 2014-08-31 NOTE — Patient Instructions (Addendum)
Schedule for your mammogram. Health Maintenance Adopting a healthy lifestyle and getting preventive care can go a long way to promote health and wellness. Talk with your health care provider about what schedule of regular examinations is right for you. This is a good chance for you to check in with your provider about disease prevention and staying healthy. In between checkups, there are plenty of things you can do on your own. Experts have done a lot of research about which lifestyle changes and preventive measures are most likely to keep you healthy. Ask your health care provider for more information. WEIGHT AND DIET  Eat a healthy diet  Be sure to include plenty of vegetables, fruits, low-fat dairy products, and lean protein.  Do not eat a lot of foods high in solid fats, added sugars, or salt.  Get regular exercise. This is one of the most important things you can do for your health.  Most adults should exercise for at least 150 minutes each week. The exercise should increase your heart rate and make you sweat (moderate-intensity exercise).  Most adults should also do strengthening exercises at least twice a week. This is in addition to the moderate-intensity exercise.  Maintain a healthy weight  Body mass index (BMI) is a measurement that can be used to identify possible weight problems. It estimates body fat based on height and weight. Your health care provider can help determine your BMI and help you achieve or maintain a healthy weight.  For females 57 years of age and older:   A BMI below 18.5 is considered underweight.  A BMI of 18.5 to 24.9 is normal.  A BMI of 25 to 29.9 is considered overweight.  A BMI of 30 and above is considered obese.  Watch levels of cholesterol and blood lipids  You should start having your blood tested for lipids and cholesterol at 52 years of age, then have this test every 5 years.  You may need to have your cholesterol levels checked more  often if:  Your lipid or cholesterol levels are high.  You are older than 52 years of age.  You are at high risk for heart disease.  CANCER SCREENING   Lung Cancer  Lung cancer screening is recommended for adults 16-57 years old who are at high risk for lung cancer because of a history of smoking.  A yearly low-dose CT scan of the lungs is recommended for people who:  Currently smoke.  Have quit within the past 15 years.  Have at least a 30-pack-year history of smoking. A pack year is smoking an average of one pack of cigarettes a day for 1 year.  Yearly screening should continue until it has been 15 years since you quit.  Yearly screening should stop if you develop a health problem that would prevent you from having lung cancer treatment.  Breast Cancer  Practice breast self-awareness. This means understanding how your breasts normally appear and feel.  It also means doing regular breast self-exams. Let your health care provider know about any changes, no matter how small.  If you are in your 20s or 30s, you should have a clinical breast exam (CBE) by a health care provider every 1-3 years as part of a regular health exam.  If you are 12 or older, have a CBE every year. Also consider having a breast X-ray (mammogram) every year.  If you have a family history of breast cancer, talk to your health care provider about genetic screening.  If you are at high risk for breast cancer, talk to your health care provider about having an MRI and a mammogram every year.  Breast cancer gene (BRCA) assessment is recommended for women who have family members with BRCA-related cancers. BRCA-related cancers include:  Breast.  Ovarian.  Tubal.  Peritoneal cancers.  Results of the assessment will determine the need for genetic counseling and BRCA1 and BRCA2 testing. Cervical Cancer Routine pelvic examinations to screen for cervical cancer are no longer recommended for nonpregnant  women who are considered low risk for cancer of the pelvic organs (ovaries, uterus, and vagina) and who do not have symptoms. A pelvic examination may be necessary if you have symptoms including those associated with pelvic infections. Ask your health care provider if a screening pelvic exam is right for you.   The Pap test is the screening test for cervical cancer for women who are considered at risk.  If you had a hysterectomy for a problem that was not cancer or a condition that could lead to cancer, then you no longer need Pap tests.  If you are older than 65 years, and you have had normal Pap tests for the past 10 years, you no longer need to have Pap tests.  If you have had past treatment for cervical cancer or a condition that could lead to cancer, you need Pap tests and screening for cancer for at least 20 years after your treatment.  If you no longer get a Pap test, assess your risk factors if they change (such as having a new sexual partner). This can affect whether you should start being screened again.  Some women have medical problems that increase their chance of getting cervical cancer. If this is the case for you, your health care provider may recommend more frequent screening and Pap tests.  The human papillomavirus (HPV) test is another test that may be used for cervical cancer screening. The HPV test looks for the virus that can cause cell changes in the cervix. The cells collected during the Pap test can be tested for HPV.  The HPV test can be used to screen women 10 years of age and older. Getting tested for HPV can extend the interval between normal Pap tests from three to five years.  An HPV test also should be used to screen women of any age who have unclear Pap test results.  After 52 years of age, women should have HPV testing as often as Pap tests.  Colorectal Cancer  This type of cancer can be detected and often prevented.  Routine colorectal cancer screening  usually begins at 52 years of age and continues through 52 years of age.  Your health care provider may recommend screening at an earlier age if you have risk factors for colon cancer.  Your health care provider may also recommend using home test kits to check for hidden blood in the stool.  A small camera at the end of a tube can be used to examine your colon directly (sigmoidoscopy or colonoscopy). This is done to check for the earliest forms of colorectal cancer.  Routine screening usually begins at age 5.  Direct examination of the colon should be repeated every 5-10 years through 52 years of age. However, you may need to be screened more often if early forms of precancerous polyps or small growths are found. Skin Cancer  Check your skin from head to toe regularly.  Tell your health care provider about any new moles  or changes in moles, especially if there is a change in a mole's shape or color.  Also tell your health care provider if you have a mole that is larger than the size of a pencil eraser.  Always use sunscreen. Apply sunscreen liberally and repeatedly throughout the day.  Protect yourself by wearing long sleeves, pants, a wide-brimmed hat, and sunglasses whenever you are outside. HEART DISEASE, DIABETES, AND HIGH BLOOD PRESSURE   Have your blood pressure checked at least every 1-2 years. High blood pressure causes heart disease and increases the risk of stroke.  If you are between 45 years and 64 years old, ask your health care provider if you should take aspirin to prevent strokes.  Have regular diabetes screenings. This involves taking a blood sample to check your fasting blood sugar level.  If you are at a normal weight and have a low risk for diabetes, have this test once every three years after 52 years of age.  If you are overweight and have a high risk for diabetes, consider being tested at a younger age or more often. PREVENTING INFECTION  Hepatitis B  If  you have a higher risk for hepatitis B, you should be screened for this virus. You are considered at high risk for hepatitis B if:  You were born in a country where hepatitis B is common. Ask your health care provider which countries are considered high risk.  Your parents were born in a high-risk country, and you have not been immunized against hepatitis B (hepatitis B vaccine).  You have HIV or AIDS.  You use needles to inject street drugs.  You live with someone who has hepatitis B.  You have had sex with someone who has hepatitis B.  You get hemodialysis treatment.  You take certain medicines for conditions, including cancer, organ transplantation, and autoimmune conditions. Hepatitis C  Blood testing is recommended for:  Everyone born from [birth year] through 1963.  Anyone with known risk factors for hepatitis C. Sexually transmitted infections (STIs)  You should be screened for sexually transmitted infections (STIs) including gonorrhea and chlamydia if:  You are sexually active and are younger than 52 years of age.  You are older than 52 years of age and your health care provider tells you that you are at risk for this type of infection.  Your sexual activity has changed since you were last screened and you are at an increased risk for chlamydia or gonorrhea. Ask your health care provider if you are at risk.  If you do not have HIV, but are at risk, it may be recommended that you take a prescription medicine daily to prevent HIV infection. This is called pre-exposure prophylaxis (PrEP). You are considered at risk if:  You are sexually active and do not regularly use condoms or know the HIV status of your partner(s).  You take drugs by injection.  You are sexually active with a partner who has HIV. Talk with your health care provider about whether you are at high risk of being infected with HIV. If you choose to begin PrEP, you should first be tested for HIV. You should then be  tested every 3 months for as long as you are taking PrEP.  PREGNANCY   If you are premenopausal and you may become pregnant, ask your health care provider about preconception counseling.  If you may become pregnant, take 400 to 800 micrograms (mcg) of folic acid every day.  If you want to prevent pregnancy,  talk to your health care provider about birth control (contraception). OSTEOPOROSIS AND MENOPAUSE   Osteoporosis is a disease in which the bones lose minerals and strength with aging. This can result in serious bone fractures. Your risk for osteoporosis can be identified using a bone density scan.  If you are 16 years of age or older, or if you are at risk for osteoporosis and fractures, ask your health care provider if you should be screened.  Ask your health care provider whether you should take a calcium or vitamin D supplement to lower your risk for osteoporosis.  Menopause may have certain physical symptoms and risks.  Hormone replacement therapy may reduce some of these symptoms and risks. Talk to your health care provider about whether hormone replacement therapy is right for you.  HOME CARE INSTRUCTIONS   Schedule regular health, dental, and eye exams.  Stay current with your immunizations.   Do not use any tobacco products including cigarettes, chewing tobacco, or electronic cigarettes.  If you are pregnant, do not drink alcohol.  If you are breastfeeding, limit how much and how often you drink alcohol.  Limit alcohol intake to no more than 1 drink per day for nonpregnant women. One drink equals 12 ounces of beer, 5 ounces of wine, or 1 ounces of hard liquor.  Do not use street drugs.  Do not share needles.  Ask your health care provider for help if you need support or information about quitting drugs.  Tell your health care provider if you often feel depressed.  Tell your health care provider if you have ever been abused or do not feel safe at home. Document  Released: 09/08/2010 Document Revised: 07/10/2013 Document Reviewed: 01/25/2013 [facility] Patient Information 2015 [place], [place]. This information is not intended to replace advice given to you by your health care provider. Make sure you discuss any questions you have with your health care provider.   Urinary Incontinence Urinary incontinence is the involuntary loss of urine from your bladder. CAUSES  There are many causes of urinary incontinence. They include:  Medicines.  Infections.  Prostatic enlargement, leading to overflow of urine from your bladder.  Surgery.  Neurological diseases.  Emotional factors. SIGNS AND SYMPTOMS Urinary Incontinence can be divided into four types:  Urge incontinence. Urge incontinence is the involuntary loss of urine before you have the opportunity to go to the bathroom. There is a sudden urge to void but not enough time to reach a bathroom.  Stress incontinence. Stress incontinence is the sudden loss of urine with any activity that forces urine to pass. It is commonly caused by anatomical changes to the pelvis and sphincter areas of your body.  Overflow incontinence. Overflow incontinence is the loss of urine from an obstructed opening to your bladder. This results in a backup of urine and a resultant buildup of pressure within the bladder. When the pressure within the bladder exceeds the closing pressure of the sphincter, the urine overflows, which causes incontinence, similar to water overflowing a dam.  Total incontinence. Total incontinence is the loss of urine as a result of the inability to store urine within your bladder. DIAGNOSIS  Evaluating the cause of incontinence may require:  A thorough and complete medical and obstetric history.  A complete physical exam.  Laboratory tests such as a urine culture and sensitivities. When additional tests are indicated, they can include:  An ultrasound exam.  Kidney and bladder  X-rays.  Cystoscopy. This is an exam of the bladder using  a narrow scope.  Urodynamic testing to test the nerve function to the bladder and sphincter areas. TREATMENT  Treatment for urinary incontinence depends on the cause:  For urge incontinence caused by a bacterial infection, antibiotics will be prescribed. If the urge incontinence is related to medicines you take, your health care provider may have you change the medicine.  For stress incontinence, surgery to re-establish anatomical support to the bladder or sphincter, or both, will often correct the condition.  For overflow incontinence caused by an enlarged prostate, an operation to open the channel through the enlarged prostate will allow the flow of urine out of the bladder. In women with fibroids, a hysterectomy may be recommended.  For total incontinence, surgery on your urinary sphincter may help. An artificial urinary sphincter (an inflatable cuff placed around the urethra) may be required. In women who have developed a hole-like passage between their bladder and vagina (vesicovaginal fistula), surgery to close the fistula often is required. HOME CARE INSTRUCTIONS  Normal daily hygiene and the use of pads or adult diapers that are changed regularly will help prevent odors and skin damage.  Avoid caffeine. It can overstimulate your bladder.  Use the bathroom regularly. Try about every 2-3 hours to go to the bathroom, even if you do not feel the need to do so. Take time to empty your bladder completely. After urinating, wait a minute. Then try to urinate again.  For causes involving nerve dysfunction, keep a log of the medicines you take and a journal of the times you go to the bathroom. SEEK MEDICAL CARE IF:  You experience worsening of pain instead of improvement in pain after your procedure.  Your incontinence becomes worse instead of better. SEE IMMEDIATE MEDICAL CARE IF:  You experience fever or shaking chills.  You  are unable to pass your urine.  You have redness spreading into your groin or down into your thighs. MAKE SURE YOU:   Understand these instructions.   Will watch your condition.  Will get help right away if you are not doing well or get worse. Document Released: 04/02/2004 Document Revised: 12/14/2012 Document Reviewed: 08/02/2012 [facility] Patient Information 2015 [place], [place]. This information is not intended to replace advice given to you by your health care provider. Make sure you discuss any questions you have with your health care provider.

## 2014-08-31 NOTE — Progress Notes (Deleted)
   Subjective:    Patient ID: Audrey Farley, female    DOB: 1960-06-03, 54 y.o.   MRN: [id number]  HPI    Review of Systems  Constitutional: Negative.   HENT: Negative.   Eyes: Negative.   Respiratory: Negative.   Cardiovascular: Negative.   Gastrointestinal: Negative.   Endocrine: Negative.   Genitourinary: Negative.   Musculoskeletal: Negative.   Skin: Negative.   Allergic/Immunologic: Negative.   Neurological: Negative.   Hematological: Negative.   Psychiatric/Behavioral: Negative.        Objective:   Physical Exam        Assessment & Plan:

## 2014-08-31 NOTE — Progress Notes (Addendum)
Subjective:    Patient ID: Audrey Farley, female    DOB: 1960-09-20, 54 y.o.   MRN: [id number] This chart was scribed for Audrey Cheadle, MD by Zola Button, Medical Scribe. This patient was seen in Room 26 and the patient's care was started at 2:17 PM.   No chief complaint on file.   HPI HPI Comments: Audrey Farley is a 54 y.o. female with a hx of hypertension who presents to the [facility] for a complete physical exam. Patient is not fasting; she has had popcorn and lemonade today.  She had a complete physical with Dr. Marin Comment 1 year ago. She had her last mammogram at the [facility] in December, 2014 which was normal. Last pap was December, 2014 which was normal. Gynecologist is Dr. Molli Posey. Hx of fibroids in 2001. She has had an abdominal hysterectomy, but she still has her ovaries. She apparently still has her cervix per Dr. Gus Puma exam and surgical pathology report. She was referred for a screening colonoscopy last year, but patient never returned phone call to [facility] to schedule. She is on blood pressure medications which have been stable for many years. All routine labs last year were normal.   Hypertension: Patient did take her blood pressure medications this morning. She does not check her blood pressure outside of the office. She takes the clonidine once a day in the morning.  Urinary Incontinence: Patient denies any problems with the Ditropan. She does still have some stress incontinence, but this is improved. She does do Kegel exercises sometimes. Patient denies urinary urgency and urinary frequency. She does not have a urologist.  Vitamins/Supplements: Patient does not take any vitamins or supplements. She does not drink much cow milk, but she does drink soy milk.  Exercise: Patient states she has been getting exercise through walking. She also goes to the gym occasionally.  Cancer Screening: Patient does not remember receiving a call from [facility]  Gastroenterology. She would like for them to call her again. She has not had a colonoscopy before. Her OB/GYN provider is Dr. Ulanda Edison. She is due for her mammogram this fall/winter.  Immunizations: She is unsure when her last tetanus shot was.   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   Patient denies chest pain.  Past Medical History  Diagnosis Date  . Hypertension   . Allergy   . Pap smear for cervical cancer screening     2014--normal   Past Surgical History  Procedure Laterality Date  . Abdominal hysterectomy      still has cervix   Family History  Problem Relation Age of Onset  . Diabetes Mother   . Hypertension Mother   . Heart attack Mother   . Hyperlipidemia Mother   . Heart disease Mother   . Hyperlipidemia Sister   . Hypertension Sister   . Multiple sclerosis Sister   . Sudden death Neg Hx   . Mental illness Brother   . Epilepsy Brother   . Diabetes Sister     History   Social History  . Marital Status: Single    Spouse Name: N/A  . Number of Children: N/A  . Years of Education: N/A   Occupational History  . Not on file.   Social History Main Topics  . Smoking status: Never Smoker   . Smokeless tobacco: Not on file  . Alcohol Use: Yes  . Drug Use: No  . Sexual Activity:  Yes   Other Topics Concern  . Not on file   Social History Narrative    No current outpatient prescriptions on file prior to visit.   No current facility-administered medications on file prior to visit.   No Known Allergies  Review of Systems  All other systems reviewed and are negative. 13 point ROS reviewed on patient health survey - all Negative     Objective:  BP 149/89 mmHg  Pulse 50  Temp(Src) 97.8 F (36.6 C) (Oral)  Resp 16  Ht 5' 3.5" (1.613 m)  Wt 238 lb 3.2 oz (108.047 kg)  BMI 41.53 kg/m2  SpO2 99%  Physical Exam  Constitutional: She is oriented to person, place, and time. She appears well-developed and  well-nourished. No distress.  HENT:  Head: Normocephalic and atraumatic.  Right Ear: Tympanic membrane normal.  Left Ear: Tympanic membrane normal.  Nose: Rhinorrhea present.  Mouth/Throat: Oropharynx is clear and moist. No oropharyngeal exudate, posterior oropharyngeal edema or posterior oropharyngeal erythema.  Eyes: Pupils are equal, round, and reactive to light.  Neck: Neck supple. No thyromegaly present.  Normal thyroid.  Cardiovascular: Normal rate, regular rhythm, S1 normal, S2 normal and normal heart sounds.   No murmur heard. Pulmonary/Chest: Effort normal and breath sounds normal. No respiratory distress. She has no wheezes. She has no rales.  Clear to auscultation bilaterally. Good air movement.  Abdominal: There is no CVA tenderness.  Genitourinary:  Numerous soft, non-tender, mobile, poorly defined nodules, bilateral breasts.  Musculoskeletal: She exhibits no edema.  Lymphadenopathy:       Head (right side): Tonsillar adenopathy present.  Neurological: She is alert and oriented to person, place, and time. No cranial nerve deficit.  Skin: Skin is warm and dry. Rash noted.  Bilateral scaling and hypopigmented pinpoint macules all over bilateral lower extremities with white thickened scaling rash on plantar surface of feet. Toenails thickened and yellow. Lower extremity rash fluoresced under woods lamp.   Psychiatric: She has a normal mood and affect. Her behavior is normal.  Vitals reviewed.   [facility] reading (PRIMARY) by  Dr. Brigitte Pulse. EKG: filled t waves in lead 1, and aFV as well as lateral chest leads V4-6     Assessment & Plan:   1. Health maintenance examination   2. Essential hypertension   3. Polypharmacy   4. Routine general medical examination at a health care facility   5. Encounter for screening colonoscopy   6. Screening for hyperlipidemia   7. Urinary incontinence, unspecified incontinence type   8. Screening for cardiovascular, respiratory, and genitourinary  diseases   9. Screening for thyroid disorder   10. Fibrocystic breast changes of both breasts   11. Onychomycosis of left great toe   12. Tinea versicolor   13. Tinea pedis of both feet   14. Need for prophylactic vaccination with combined diphtheria-tetanus-pertussis (DTP) vaccine     Orders Placed This Encounter  Procedures  . Tdap vaccine greater than or equal to 7yo IM  . Comprehensive metabolic panel  . TSH  . CBC  . Vit D  25 hydroxy (rtn osteoporosis monitoring)  . Ambulatory referral to Gastroenterology    Referral Priority:  Routine    Referral Type:  Consultation    Referral Reason:  Specialty Services Required    Number of Visits Requested:  1  . POCT urinalysis dipstick  . EKG 12-Lead    Meds ordered this encounter  Medications  . telmisartan-hydrochlorothiazide (MICARDIS HCT) 80-12.5 MG per tablet  Sig: Take 1 tablet by mouth daily with breakfast.    Dispense:  90 tablet    Refill:  3  . cloNIDine (CATAPRES) 0.1 MG tablet    Sig: Take 1 tablet (0.1 mg total) by mouth 2 (two) times daily.    Dispense:  180 tablet    Refill:  3  . oxybutynin (DITROPAN-XL) 5 MG 24 hr tablet    Sig: Take 1 tablet (5 mg total) by mouth at bedtime.    Dispense:  90 tablet    Refill:  3  . miconazole (MICATIN) 2 % cream    Sig: Apply 1 application topically 2 (two) times daily. X 2-4 wks to bilateral lower extremities and feet    Dispense:  198 g    Refill:  0    I personally performed the services described in this documentation, which was scribed in my presence. The recorded information has been reviewed and considered, and addended by me as needed.  Audrey Cheadle, MD MPH  Results for orders placed or performed in visit on 08/31/14  Comprehensive metabolic panel  Result Value Ref Range   Sodium 141 135 - 145 mEq/L   Potassium 4.1 3.5 - 5.3 mEq/L   Chloride 106 96 - 112 mEq/L   CO2 27 19 - 32 mEq/L   Glucose, Bld 93 70 - 99 mg/dL   BUN 13 6 - 23 mg/dL   Creat 0.74 0.50 -  1.10 mg/dL   Total Bilirubin 0.5 0.2 - 1.2 mg/dL   Alkaline Phosphatase 86 39 - 117 U/L   AST 11 0 - 37 U/L   ALT 11 0 - 35 U/L   Total Protein 6.1 6.0 - 8.3 g/dL   Albumin 3.6 3.5 - 5.2 g/dL   Calcium 8.9 8.4 - 10.5 mg/dL  TSH  Result Value Ref Range   TSH 1.376 0.350 - 4.500 uIU/mL  CBC  Result Value Ref Range   WBC 4.2 4.0 - 10.5 K/uL   RBC 4.14 3.87 - 5.11 MIL/uL   Hemoglobin 11.8 (L) 12.0 - 15.0 g/dL   HCT 34.3 (L) 36.0 - 46.0 %   MCV 82.9 78.0 - 100.0 fL   MCH 28.5 26.0 - 34.0 pg   MCHC 34.4 30.0 - 36.0 g/dL   RDW 13.4 11.5 - 15.5 %   Platelets 204 150 - 400 K/uL   MPV 9.5 8.6 - 12.4 fL  Vit D  25 hydroxy (rtn osteoporosis monitoring)  Result Value Ref Range   Vit D, 25-Hydroxy 11 (L) 30 - 100 ng/mL  POCT urinalysis dipstick  Result Value Ref Range   Color, UA yellow    Clarity, UA cloudy    Glucose, UA neg    Bilirubin, UA neg    Ketones, UA neg    Spec Grav, UA 1.020    Blood, UA neg    pH, UA 5.5    Protein, UA neg    Urobilinogen, UA 0.2    Nitrite, UA neg    Leukocytes, UA Negative Negative

## 2014-09-01 LAB — COMPREHENSIVE METABOLIC PANEL
ALT: 11 U/L (ref 0–35)
AST: 11 U/L (ref 0–37)
Albumin: 3.6 g/dL (ref 3.5–5.2)
Alkaline Phosphatase: 86 U/L (ref 39–117)
BUN: 13 mg/dL (ref 6–23)
CO2: 27 meq/L (ref 19–32)
CREATININE: 0.74 mg/dL (ref 0.50–1.10)
Calcium: 8.9 mg/dL (ref 8.4–10.5)
Chloride: 106 mEq/L (ref 96–112)
GLUCOSE: 93 mg/dL (ref 70–99)
Potassium: 4.1 mEq/L (ref 3.5–5.3)
Sodium: 141 mEq/L (ref 135–145)
Total Bilirubin: 0.5 mg/dL (ref 0.2–1.2)
Total Protein: 6.1 g/dL (ref 6.0–8.3)

## 2014-09-01 LAB — TSH: TSH: 1.376 u[IU]/mL (ref 0.350–4.500)

## 2014-09-01 LAB — VITAMIN D 25 HYDROXY (VIT D DEFICIENCY, FRACTURES): VIT D 25 HYDROXY: 11 ng/mL — AB (ref 30–100)

## 2015-01-24 ENCOUNTER — Telehealth: Payer: Self-pay | Admitting: Family Medicine

## 2015-01-24 NOTE — Telephone Encounter (Signed)
Patient request for Dr. Brigitte Pulse to complete a form for Confirmation of physical. Patient request for the form to be completed by November 22nd. Please call patient at [phone]. I will place the form in the nurses's box at 102. Thanks.

## 2015-01-24 NOTE — Telephone Encounter (Signed)
I will place in Dr. Raul Del box.

## 2015-01-28 NOTE — Telephone Encounter (Signed)
Can't find paper.

## 2015-01-28 NOTE — Telephone Encounter (Signed)
It is in your box now. I apologize.

## 2015-01-29 NOTE — Telephone Encounter (Signed)
Signed and returned to nurse box this a.m.

## 2015-01-30 NOTE — Telephone Encounter (Signed)
Faxed today, copy in pick up draw.

## 2015-02-01 NOTE — Telephone Encounter (Signed)
Left message letting pt know. 

## 2015-09-15 ENCOUNTER — Other Ambulatory Visit: Payer: Self-pay | Admitting: Family Medicine

## 2015-11-25 ENCOUNTER — Other Ambulatory Visit: Payer: Self-pay | Admitting: Family Medicine

## 2015-11-25 DIAGNOSIS — Z0000 Encounter for general adult medical examination without abnormal findings: Secondary | ICD-10-CM

## 2015-11-25 DIAGNOSIS — Z1211 Encounter for screening for malignant neoplasm of colon: Secondary | ICD-10-CM

## 2015-11-25 DIAGNOSIS — R32 Unspecified urinary incontinence: Secondary | ICD-10-CM

## 2015-11-25 DIAGNOSIS — Z13220 Encounter for screening for lipoid disorders: Secondary | ICD-10-CM

## 2015-12-02 ENCOUNTER — Other Ambulatory Visit: Payer: Self-pay | Admitting: Family Medicine

## 2015-12-02 DIAGNOSIS — R32 Unspecified urinary incontinence: Secondary | ICD-10-CM

## 2015-12-02 DIAGNOSIS — Z1211 Encounter for screening for malignant neoplasm of colon: Secondary | ICD-10-CM

## 2015-12-02 DIAGNOSIS — Z0000 Encounter for general adult medical examination without abnormal findings: Secondary | ICD-10-CM

## 2015-12-02 DIAGNOSIS — Z13220 Encounter for screening for lipoid disorders: Secondary | ICD-10-CM

## 2015-12-03 ENCOUNTER — Other Ambulatory Visit: Payer: Self-pay

## 2015-12-03 NOTE — Telephone Encounter (Signed)
Dr Brigitte Pulse denied Ditropan  2nd request for Micardis and Ditroban received.  Attempted to call patient.  Phone number cell/work same and do not have voice mail set up.  Spoke with pharmacy - [place] will put note on chart and advise patient when she calls for refill - Denied due to no office visit in over 1 year.

## 2015-12-13 ENCOUNTER — Ambulatory Visit: Payer: Managed Care, Other (non HMO)

## 2015-12-20 ENCOUNTER — Ambulatory Visit (INDEPENDENT_AMBULATORY_CARE_PROVIDER_SITE_OTHER): Payer: Managed Care, Other (non HMO) | Admitting: Family Medicine

## 2015-12-20 VITALS — BP 152/96 | HR 56 | Temp 98.1°F | Resp 17 | Ht 62.75 in | Wt 218.0 lb

## 2015-12-20 DIAGNOSIS — Z114 Encounter for screening for human immunodeficiency virus [HIV]: Secondary | ICD-10-CM | POA: Diagnosis not present

## 2015-12-20 DIAGNOSIS — Z0000 Encounter for general adult medical examination without abnormal findings: Secondary | ICD-10-CM

## 2015-12-20 DIAGNOSIS — R9431 Abnormal electrocardiogram [ECG] [EKG]: Secondary | ICD-10-CM | POA: Diagnosis not present

## 2015-12-20 DIAGNOSIS — Z13220 Encounter for screening for lipoid disorders: Secondary | ICD-10-CM | POA: Diagnosis not present

## 2015-12-20 DIAGNOSIS — R0609 Other forms of dyspnea: Secondary | ICD-10-CM | POA: Diagnosis not present

## 2015-12-20 DIAGNOSIS — N3941 Urge incontinence: Secondary | ICD-10-CM

## 2015-12-20 DIAGNOSIS — Z1211 Encounter for screening for malignant neoplasm of colon: Secondary | ICD-10-CM

## 2015-12-20 DIAGNOSIS — Z23 Encounter for immunization: Secondary | ICD-10-CM

## 2015-12-20 DIAGNOSIS — R0600 Dyspnea, unspecified: Secondary | ICD-10-CM

## 2015-12-20 DIAGNOSIS — I10 Essential (primary) hypertension: Secondary | ICD-10-CM

## 2015-12-20 DIAGNOSIS — E559 Vitamin D deficiency, unspecified: Secondary | ICD-10-CM

## 2015-12-20 DIAGNOSIS — Z131 Encounter for screening for diabetes mellitus: Secondary | ICD-10-CM | POA: Diagnosis not present

## 2015-12-20 DIAGNOSIS — Z1159 Encounter for screening for other viral diseases: Secondary | ICD-10-CM | POA: Diagnosis not present

## 2015-12-20 LAB — CBC WITH DIFFERENTIAL/PLATELET
BASOS ABS: 0 {cells}/uL (ref 0–200)
Basophils Relative: 0 %
EOS PCT: 2 %
Eosinophils Absolute: 94 cells/uL (ref 15–500)
HCT: 37.6 % (ref 35.0–45.0)
Hemoglobin: 12.3 g/dL (ref 11.7–15.5)
Lymphocytes Relative: 46 %
Lymphs Abs: 2162 cells/uL (ref 850–3900)
MCH: 27.8 pg (ref 27.0–33.0)
MCHC: 32.7 g/dL (ref 32.0–36.0)
MCV: 85.1 fL (ref 80.0–100.0)
MONOS PCT: 7 %
MPV: 8.8 fL (ref 7.5–12.5)
Monocytes Absolute: 329 cells/uL (ref 200–950)
NEUTROS ABS: 2115 {cells}/uL (ref 1500–7800)
NEUTROS PCT: 45 %
PLATELETS: 234 10*3/uL (ref 140–400)
RBC: 4.42 MIL/uL (ref 3.80–5.10)
RDW: 13.2 % (ref 11.0–15.0)
WBC: 4.7 10*3/uL (ref 3.8–10.8)

## 2015-12-20 LAB — POCT URINALYSIS DIP (MANUAL ENTRY)
BILIRUBIN UA: NEGATIVE
Bilirubin, UA: NEGATIVE
Blood, UA: NEGATIVE
Glucose, UA: NEGATIVE
LEUKOCYTES UA: NEGATIVE
Nitrite, UA: NEGATIVE
PH UA: 7
PROTEIN UA: NEGATIVE
Spec Grav, UA: 1.02
UROBILINOGEN UA: 0.2

## 2015-12-20 LAB — TSH: TSH: 0.81 mIU/L

## 2015-12-20 LAB — COMPREHENSIVE METABOLIC PANEL
ALT: 13 U/L (ref 6–29)
AST: 12 U/L (ref 10–35)
Albumin: 4.1 g/dL (ref 3.6–5.1)
Alkaline Phosphatase: 83 U/L (ref 33–130)
BUN: 14 mg/dL (ref 7–25)
CHLORIDE: 105 mmol/L (ref 98–110)
CO2: 26 mmol/L (ref 20–31)
CREATININE: 0.73 mg/dL (ref 0.50–1.05)
Calcium: 9.4 mg/dL (ref 8.6–10.4)
Glucose, Bld: 87 mg/dL (ref 65–99)
Potassium: 4.3 mmol/L (ref 3.5–5.3)
SODIUM: 141 mmol/L (ref 135–146)
Total Bilirubin: 0.5 mg/dL (ref 0.2–1.2)
Total Protein: 6.7 g/dL (ref 6.1–8.1)

## 2015-12-20 LAB — LIPID PANEL
Cholesterol: 218 mg/dL — ABNORMAL HIGH (ref 125–200)
HDL: 105 mg/dL (ref >=46)
LDL CALC: 104 mg/dL (ref <130)
Total CHOL/HDL Ratio: 2.1 Ratio (ref <=5.0)
Triglycerides: 43 mg/dL (ref <150)
VLDL: 9 mg/dL (ref <30)

## 2015-12-20 LAB — HEMOGLOBIN A1C
HEMOGLOBIN A1C: 5.4 % (ref <5.7)
MEAN PLASMA GLUCOSE: 108 mg/dL

## 2015-12-20 MED ORDER — OXYBUTYNIN CHLORIDE ER 10 MG PO TB24
10.0000 mg | ORAL_TABLET | Freq: Every day | ORAL | 3 refills | Status: DC
Start: 2015-12-20 — End: 2017-01-25

## 2015-12-20 MED ORDER — TELMISARTAN-HCTZ 80-25 MG PO TABS: 1.0000 | ORAL_TABLET | Freq: Every day | ORAL | 3 refills | Status: DC

## 2015-12-20 NOTE — Patient Instructions (Addendum)
   IF you received an x-ray today, you will receive an invoice from [facility]. Please contact [facility] at [phone] with questions or concerns regarding your invoice.   IF you received labwork today, you will receive an invoice from [facility]. Please contact [facility] at [phone] with questions or concerns regarding your invoice.   Our billing staff will not be able to assist you with questions regarding bills from these companies.  You will be contacted with the lab results as soon as they are available. The fastest way to get your results is to activate your My Chart account. Instructions are located on the last page of this paperwork. If you have not heard from us regarding the results in 2 weeks, please contact this office.    We recommend that you schedule a mammogram for breast cancer screening. Typically, you do not need a referral to do this. Please contact a local imaging center to schedule your mammogram.  [facility] - [phone]  *ask for the Radiology Department [facility]) - [phone] or [phone]  [facility] - [phone] [facility] - [phone] [facility] - [phone]  *ask for the Radiology Department  [facility] - [phone]  *ask for the Radiology Department [facility] - [phone]  *ask for the Mammography Department [facility] - [phone] You Healthy  Get These Tests  Blood Pressure- Have your blood pressure checked by your healthcare provider at least once a year.  Normal blood pressure is 120/80.  Weight- Have your body mass index (BMI) calculated to screen for obesity.  BMI is a measure of body fat based on height and weight.  You can calculate your own BMI at www.nhlbisupport.com/bmi/  Cholesterol- Have your cholesterol checked every year.  Diabetes- Have  your blood sugar checked every year if you have high blood pressure, high cholesterol, a family history of diabetes or if you are overweight.  Pap Test - Have a pap test every 1 to 5 years if you have been sexually active.  If you are older than 65 and recent pap tests have been normal you may not need additional pap tests.  In addition, if you have had a hysterectomy  for benign disease additional pap tests are not necessary.  Mammogram-Yearly mammograms are essential for early detection of breast cancer  Screening for Colon Cancer- Colonoscopy starting at age 50. Screening may begin sooner depending on your family history and other health conditions.  Follow up colonoscopy as directed by your Gastroenterologist.  Screening for Osteoporosis- Screening begins at age 65 with bone density scanning, sooner if you are at higher risk for developing Osteoporosis.  Get these medicines  Calcium with Vitamin D- Your body requires 1200-1500 mg of Calcium a day and 800-1000 IU of Vitamin D a day.  You can only absorb 500 mg of Calcium at a time therefore Calcium must be taken in 2 or 3 separate doses throughout the day.  Hormones- Hormone therapy has been associated with increased risk for certain cancers and heart disease.  Talk to your healthcare provider about if you need relief from menopausal symptoms.  Aspirin- Ask your healthcare provider about taking Aspirin to prevent Heart Disease and Stroke.  Get these Immuniztions  Flu shot- Every fall  Pneumonia shot- Once after the age of 65; if you are younger ask your healthcare provider if you need a pneumonia shot.    Tetanus- Every ten years.  Zostavax- Once after the age of 60 to prevent shingles.  Take these steps  Don't smoke- Your healthcare provider can help you quit. For tips on how to quit, ask your healthcare provider or go to www.smokefree.gov or call 1-800 QUIT-NOW.  Be physically active- Exercise 5 days a week for a minimum of 30  minutes.  If you are not already physically active, start slow and gradually work up to 30 minutes of moderate physical activity.  Try walking, dancing, bike riding, swimming, etc.  Eat a healthy diet- Eat a variety of healthy foods such as fruits, vegetables, whole grains, low fat milk, low fat cheeses, yogurt, lean meats, chicken, fish, eggs, dried beans, tofu, etc.  For more information go to www.thenutritionsource.org  Dental visit- Brush and floss teeth twice daily; visit your dentist twice a year.  Eye exam- Visit your Optometrist or Ophthalmologist yearly.  Drink alcohol in moderation- Limit alcohol intake to one drink or less a day.  Never drink and drive.  Depression- Your emotional health is as important as your physical health.  If you're feeling down or losing interest in things you normally enjoy, please talk to your healthcare provider.  Seat Belts- can save your life; always wear one  Smoke/Carbon Monoxide detectors- These detectors need to be installed on the appropriate level of your home.  Replace batteries at least once a year.  Violence- If anyone is threatening or hurting you, please tell your healthcare provider. Living Will/ Health care power of attorney- Discuss with your healthcare provider and family. 

## 2015-12-20 NOTE — Progress Notes (Signed)
Subjective:    Patient ID: Audrey Farley, female    DOB: 1961/01/13, 55 y.o.   MRN: [id number]  12/20/2015  Annual Exam   HPI This 55 y.o. female presents for Complete Physical Examination.    Last physical: 08-31-2014 Pap smear:  Hysterectomy; DUB.  No cervical dysplasia.  Ovaries remaining.  Dr. Matthew Saras. Mammogram:  02-24-2013 Colonoscopy:  In past; several years ago.  [place].  Has been trying to schedule it.   TDAP:  08-31-2014 Influenza:  today Eye exam:  Last year; due for follow-up Dental exam:  Several years; 2013.   HTN: Patient reports good compliance with medication, good tolerance to medication, and good symptom control.  Rarely checks.  Runs good lately. 160/90; 159-60.  Has been running high for past year. Stopped Clonidine three months ago because it was causing fatigue and sluggish.  Taking Micardis every evening.    BP Readings from Last 3 Encounters:  12/20/15 (!) 152/96  08/31/14 (!) 149/89  08/18/13 120/76    Allergic Rhinitis: taking Zyrtec or Claritin.  Urge incontinence: Ditropan XL 5mg  works moderately well.    Review of Systems  Constitutional: Negative for activity change, appetite change, chills, diaphoresis, fatigue, fever and unexpected weight change.  HENT: Negative for congestion, dental problem, drooling, ear discharge, ear pain, facial swelling, hearing loss, mouth sores, nosebleeds, postnasal drip, rhinorrhea, sinus pressure, sneezing, sore throat, tinnitus, trouble swallowing and voice change.   Eyes: Negative for photophobia, pain, discharge, redness, itching and visual disturbance.  Respiratory: Positive for cough and shortness of breath. Negative for apnea, choking, chest tightness, wheezing and stridor.   Cardiovascular: Positive for chest pain. Negative for palpitations and leg swelling.  Gastrointestinal: Negative for abdominal distention, abdominal pain, anal bleeding, blood in stool, constipation, diarrhea, nausea, rectal pain  and vomiting.  Endocrine: Negative for cold intolerance, heat intolerance, polydipsia, polyphagia and polyuria.  Genitourinary: Negative for decreased urine volume, difficulty urinating, dyspareunia, dysuria, enuresis, flank pain, frequency, genital sores, hematuria, menstrual problem, pelvic pain, urgency, vaginal bleeding, vaginal discharge and vaginal pain.  Musculoskeletal: Negative for arthralgias, back pain, gait problem, joint swelling, myalgias, neck pain and neck stiffness.  Skin: Negative for color change, pallor, rash and wound.  Allergic/Immunologic: Negative for environmental allergies, food allergies and immunocompromised state.  Neurological: Positive for dizziness and headaches. Negative for tremors, seizures, syncope, facial asymmetry, speech difficulty, weakness, light-headedness and numbness.  Hematological: Negative for adenopathy. Does not bruise/bleed easily.  Psychiatric/Behavioral: Negative for agitation, behavioral problems, confusion, decreased concentration, dysphoric mood, hallucinations, self-injury, sleep disturbance and suicidal ideas. The patient is not nervous/anxious and is not hyperactive.        Bedtime 7:00pm-9:00pm; frequent nighttime awakening due to night sweats.  Must wake up at 4:00am.    Past Medical History:  Diagnosis Date  . Allergy   . Hypertension   . Pap smear for cervical cancer screening    2014--normal  . Urge incontinence    Past Surgical History:  Procedure Laterality Date  . ABDOMINAL HYSTERECTOMY     still has cervix; DUB; ovaries intact.   No Known Allergies  Social History   Social History  . Marital status: Single    Spouse name: N/A  . Number of children: N/A  . Years of education: N/A   Occupational History  . Not on file.   Social History Main Topics  . Smoking status: Never Smoker  . Smokeless tobacco: Not on file  . Alcohol use Yes  . Drug use: No  .  Sexual activity: Yes    Birth control/ protection: Surgical     Other Topics Concern  . 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      Tobacco: none      Alcohol; weekends/socially      Drugs: none      Exercise:  Two days per week; cardio      Seatbelt: 100%; no texting      Sexual activity: not sexually active since 2014; dates males; no STDs; total partners = 7.   Family History  Problem Relation Age of Onset  . Diabetes Mother   . Hypertension Mother   . Heart attack Mother   . Hyperlipidemia Mother   . Heart disease Mother     heart problems; no AMI  . Hyperlipidemia Sister   . Hypertension Sister   . Multiple sclerosis Sister   . Mental illness Brother   . Epilepsy Brother   . Diabetes Sister   . Sudden death Neg Hx        Objective:    BP (!) 152/96 (BP Location: Right Arm, Patient Position: Sitting, Cuff Size: Large)   Pulse (!) 56   Temp 98.1 F (36.7 C) (Oral)   Resp 17   Ht 5' 2.75" (1.594 m)   Wt 218 lb (98.9 kg)   SpO2 95%   BMI 38.93 kg/m  Physical Exam  Constitutional: She is oriented to person, place, and time. She appears well-developed and well-nourished. No distress.  HENT:  Head: Normocephalic and atraumatic.  Right Ear: External ear normal.  Left Ear: External ear normal.  Nose: Nose normal.  Mouth/Throat: Oropharynx is clear and moist.  Eyes: Conjunctivae and EOM are normal. Pupils are equal, round, and reactive to light.  Neck: Normal range of motion and full passive range of motion without pain. Neck supple. No JVD present. Carotid bruit is not present. No thyromegaly present.  Cardiovascular: Normal rate, regular rhythm and normal heart sounds.  Exam reveals no gallop and no friction rub.   No murmur heard. Pulmonary/Chest: Effort normal and breath sounds normal. She has no wheezes. She  has no rales.  Abdominal: Soft. Bowel sounds are normal. She exhibits no distension and no mass. There is no tenderness. There is no rebound and no guarding.  Musculoskeletal:       Right shoulder: Normal.       Left shoulder: Normal.       Cervical back: Normal.  Lymphadenopathy:    She has no cervical adenopathy.  Neurological: She is alert and oriented to person, place, and time. She has normal reflexes. No cranial nerve deficit. She exhibits normal muscle tone. Coordination normal.  Skin: Skin is warm and dry. No rash noted. She is not diaphoretic. No erythema. No pallor.  Psychiatric: She has a normal mood and affect. Her behavior is normal. Judgment and thought content normal.  Nursing note and vitals reviewed.  Results for orders placed or performed in visit on 12/20/15  POCT urinalysis dipstick  Result Value Ref Range   Color, UA yellow yellow   Clarity, UA clear clear   Glucose, UA negative negative   Bilirubin, UA negative negative   Ketones, POC UA negative negative  Spec Grav, UA 1.020    Blood, UA negative negative   pH, UA 7.0    Protein Ur, POC negative negative   Urobilinogen, UA 0.2    Nitrite, UA Negative Negative   Leukocytes, UA Negative Negative       Assessment & Plan:   1. Routine physical examination   2. Essential hypertension   3. Need for prophylactic vaccination and inoculation against influenza   4. Vitamin D deficiency   5. Screening for HIV (human immunodeficiency virus)   6. Encounter for hepatitis C screening test for low risk patient   7. Screening for diabetes mellitus   8. Nonspecific abnormal electrocardiogram (ECG) (EKG)   9. Dyspnea on exertion   10. Routine general medical examination at a health care facility   11. Encounter for screening colonoscopy   12. Screening for hyperlipidemia   13. Colon cancer screening   14. Urge incontinence    -anticipatory guidance --- weight loss, exercise, 3 servings of calcium  daily. -gynecological care per gynecology; desires to schedule mammogram with gynecology. -obtain age appropriate screening labs. --with chest tightness, DOE, and abnormal EKG, refer to cardiology.  Avoid exercise until evaluation by cardiology. -refer for colonoscopy -increase Micardis/HCTZ to 80/25 daily.  -increase Ditropan XL to 10mg  daily.   Orders Placed This Encounter  Procedures  . Flu Vaccine QUAD 36+ mos IM  . CBC with Differential/Platelet  . Comprehensive metabolic panel    Order Specific Question:   Has the patient fasted?    Answer:   Yes  . Lipid panel    Order Specific Question:   Has the patient fasted?    Answer:   Yes  . TSH  . Hemoglobin A1c  . HIV antibody  . Hepatitis C antibody  . VITAMIN D 25 Hydroxy (Vit-D Deficiency, Fractures)  . Ambulatory referral to Cardiology    Referral Priority:   Routine    Referral Type:   Consultation    Referral Reason:   Specialty Services Required    Requested Specialty:   Cardiology    Number of Visits Requested:   1  . Ambulatory referral to Gastroenterology    Referral Priority:   Routine    Referral Type:   Consultation    Referral Reason:   Specialty Services Required    Number of Visits Requested:   1  . POCT urinalysis dipstick  . EKG 12-Lead   Meds ordered this encounter  Medications  . telmisartan-hydrochlorothiazide (MICARDIS HCT) 80-25 MG tablet    Sig: Take 1 tablet by mouth daily.    Dispense:  90 tablet    Refill:  3  . oxybutynin (DITROPAN-XL) 10 MG 24 hr tablet    Sig: Take 1 tablet (10 mg total) by mouth at bedtime.    Dispense:  90 tablet    Refill:  3    Return in about 6 months (around 06/19/2016) for recheck blood pressure.   Rateel Beldin Elayne Guerin, M.D. [facility] [address] [place], [place]  [address] [phone] phone [phone] fax

## 2015-12-21 LAB — HIV ANTIBODY (ROUTINE TESTING W REFLEX): HIV 1&2 Ab, 4th Generation: NONREACTIVE

## 2015-12-21 LAB — HEPATITIS C ANTIBODY: HCV Ab: NEGATIVE

## 2015-12-21 LAB — VITAMIN D 25 HYDROXY (VIT D DEFICIENCY, FRACTURES): Vit D, 25-Hydroxy: 12 ng/mL — ABNORMAL LOW (ref 30–100)

## 2016-01-22 DIAGNOSIS — T7840XA Allergy, unspecified, initial encounter: Secondary | ICD-10-CM | POA: Insufficient documentation

## 2016-01-22 DIAGNOSIS — K219 Gastro-esophageal reflux disease without esophagitis: Secondary | ICD-10-CM | POA: Insufficient documentation

## 2016-01-22 DIAGNOSIS — J309 Allergic rhinitis, unspecified: Secondary | ICD-10-CM | POA: Insufficient documentation

## 2016-01-22 DIAGNOSIS — N3941 Urge incontinence: Secondary | ICD-10-CM | POA: Insufficient documentation

## 2016-01-22 DIAGNOSIS — R0602 Shortness of breath: Secondary | ICD-10-CM | POA: Insufficient documentation

## 2016-01-24 ENCOUNTER — Ambulatory Visit (INDEPENDENT_AMBULATORY_CARE_PROVIDER_SITE_OTHER): Payer: Managed Care, Other (non HMO) | Admitting: Cardiovascular Disease

## 2016-01-24 ENCOUNTER — Encounter: Payer: Self-pay | Admitting: Cardiovascular Disease

## 2016-01-24 VITALS — BP 132/88 | HR 54 | Ht 63.25 in | Wt 220.2 lb

## 2016-01-24 DIAGNOSIS — R079 Chest pain, unspecified: Secondary | ICD-10-CM

## 2016-01-24 DIAGNOSIS — E6609 Other obesity due to excess calories: Secondary | ICD-10-CM

## 2016-01-24 DIAGNOSIS — R0602 Shortness of breath: Secondary | ICD-10-CM | POA: Diagnosis not present

## 2016-01-24 DIAGNOSIS — R072 Precordial pain: Secondary | ICD-10-CM

## 2016-01-24 DIAGNOSIS — R9431 Abnormal electrocardiogram [ECG] [EKG]: Secondary | ICD-10-CM

## 2016-01-24 DIAGNOSIS — I10 Essential (primary) hypertension: Secondary | ICD-10-CM

## 2016-01-24 HISTORY — DX: Chest pain, unspecified: R07.9

## 2016-01-24 NOTE — Patient Instructions (Addendum)
Medication Instructions:  Your physician recommends that you continue on your current medications as directed. Please refer to the Current Medication list given to you today.  Labwork: none  Testing/Procedures: Your physician has requested that you have an echocardiogram. Echocardiography is a painless test that uses sound waves to create images of your heart. It provides your doctor with information about the size and shape of your heart and how well your heart's chambers and valves are working. This procedure takes approximately one hour. There are no restrictions for this procedure. [place] [address]  Your physician has requested that you have en exercise stress myoview. For further information please visit [email or web address]. Please follow instruction sheet, as given. 2 DAY STUDY  Follow-Up: Your physician recommends that you schedule a follow-up appointment in: [place]

## 2016-01-24 NOTE — Progress Notes (Signed)
Cardiology Office Note   Date:  01/24/2016   ID:  Audrey Farley, DOB Aug 11, 1960, MRN [id number]  PCP:  Sheppard Evens  Cardiologist:   Skeet Latch, MD   Chief Complaint  Patient presents with  . New Patient (Initial Visit)    hypertension, abnormal ekg      History of Present Illness: Audrey Farley is a 55 y.o. female with hypertension who presents for an evaluation of chest pain and shortness of breath.  She saw Dr. Reginia Forts on 10/13 and reported chest pain and shortness of breath.  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  She was referred to cardiology for further evaluation.  She notes intermittent episodes of substernal chest pain that occur once per month.  It usually happens when working or when laying in bed.  The episodes last 15-20 minutes. There is no associated shortness of breath or diaphoresis. She did have one episode that was associated with nausea.  The pain is 5/10 in severity.  Prior to seeing her PCP she had been exercising 3 times per week on the treadmill and doing squats. At that appointment she was instructed to stop exercising until further notice.  [name] also reports some dizziness with positional changes and with exercise.  She sometimes notes it when seating and turning her head.  She denies syncope or falls.   [name] also notes shortness of breath that has been ongoing for months to years.  It is worse when going up stairs.  She denies orthopnea or PND but does have lower extremity edema.  The edema is worse when she doesn't take her BP medication.  She sometimes has difficulty affording her medicine but just found out that she has money in a health savings account.   Past Medical History:  Diagnosis Date  . Allergy   . Chest pain 01/24/2016  . Dyspnea on exertion   . Hypertension   . Urge incontinence     Past Surgical History:  Procedure Laterality Date  . ABDOMINAL HYSTERECTOMY     still has  cervix; DUB; ovaries intact.     Current Outpatient Prescriptions  Medication Sig Dispense Refill  . oxybutynin (DITROPAN-XL) 10 MG 24 hr tablet Take 1 tablet (10 mg total) by mouth at bedtime. 90 tablet 3  . telmisartan-hydrochlorothiazide (MICARDIS HCT) 80-25 MG tablet Take 1 tablet by mouth daily. 90 tablet 3   No current facility-administered medications for this visit.     Allergies:   Patient has no known allergies.    Social History:  The patient  reports that she has never smoked. She does not have any smokeless tobacco history on file. She reports that she drinks alcohol. She reports that she does not use drugs.   Family History:  The patient's family history includes Diabetes in her mother and sister; Epilepsy in her brother; Heart attack in her mother; Heart disease in her mother; Hyperlipidemia in her mother and sister; Hypertension in her mother and sister; Kidney disease in her mother; Mental illness in her brother; Multiple sclerosis in her sister.    ROS:  Please see the history of present illness.   Otherwise, review of systems are positive for none.   All other systems are reviewed and negative.    PHYSICAL EXAM: VS:  BP 132/88 (BP Location: Right Arm, Patient Position: Sitting, Cuff Size: Large)   Pulse (!) 54   Ht 5' 3.25" (1.607 m)   Wt 99.9 kg (220 lb  3.2 oz)   BMI 38.70 kg/m  , BMI Body mass index is 38.7 kg/m. GENERAL:  Well appearing HEENT:  Pupils equal round and reactive, fundi not visualized, oral mucosa unremarkable NECK:  No jugular venous distention, waveform within normal limits, carotid upstroke brisk and symmetric, no bruits, no thyromegaly LYMPHATICS:  No cervical adenopathy LUNGS:  Clear to auscultation bilaterally HEART:  RRR.  PMI not displaced or sustained,S1 and S2 within normal limits, no S3, no S4, no clicks, no rubs, no murmurs ABD:  Flat, positive bowel sounds normal in frequency in pitch, no bruits, no rebound, no guarding, no midline  pulsatile mass, no hepatomegaly, no splenomegaly EXT:  2 plus pulses throughout, no edema, no cyanosis no clubbing SKIN:  No rashes no nodules NEURO:  Cranial nerves II through XII grossly intact, motor grossly intact throughout PSYCH:  Cognitively intact, oriented to person place and time    EKG:  EKG is ordered today. The ekg ordered today demonstrates sinus bradycardia.  Non-specific T wave abnormalities.    Recent Labs: 12/20/2015: ALT 13; BUN 14; Creat 0.73; Hemoglobin 12.3; Platelets 234; Potassium 4.3; Sodium 141; TSH 0.81    Lipid Panel    Component Value Date/Time   CHOL 218 (H) 12/20/2015 1123   TRIG 43 12/20/2015 1123   HDL 105 12/20/2015 1123   CHOLHDL 2.1 12/20/2015 1123   VLDL 9 12/20/2015 1123   LDLCALC 104 12/20/2015 1123      Wt Readings from Last 3 Encounters:  01/24/16 99.9 kg (220 lb 3.2 oz)  12/20/15 98.9 kg (218 lb)  08/31/14 108 kg (238 lb 3.2 oz)      ASSESSMENT AND PLAN:  # Atypical chest pain: # Abnormal EKG:  [name]' chest pain is atypical and does not occur with exertion. However, her prior EKG was concerning for a prior inferior infarct. The one to this usingday does not seem to show this. We will obtain an exercise Cardiolite to evaluate for ischemia as well as prior infarct.   # Hypertension: Blood pressure is well-controlled today. We stressed the importance of taking medications regularly. Continue telmisartan/HCTZ.    # Obesity: [name] had been exercising but stopped at her PCP's recommendation.  We will await the results of her stress test before determining if it is safe for her to resume exercise.  # CV Disease Risk: ASCVD 10 year risk is 2.7%. Therefore we will not start aspirin or statin at this time.    Current medicines are reviewed at length with the patient today.  The patient does not have concerns regarding medicines.  The following changes have been made:  no change  Labs/ tests ordered today include:    Orders Placed This Encounter  Procedures  . Myocardial Perfusion Imaging  . EKG 12-Lead  . ECHOCARDIOGRAM COMPLETE     Disposition:   FU with Jina Olenick C. Oval Linsey, MD, [facility] in 1 month.     This note was written with the assistance of speech recognition software.  Please excuse any transcriptional errors.  Signed, Ihsan Nomura C. Oval Linsey, MD, [facility]  01/24/2016 9:07 AM    [place]

## 2016-02-05 ENCOUNTER — Telehealth (HOSPITAL_COMMUNITY): Payer: Self-pay

## 2016-02-05 NOTE — Telephone Encounter (Signed)
Encounter complete. 

## 2016-02-07 ENCOUNTER — Ambulatory Visit (HOSPITAL_COMMUNITY)
Admission: RE | Admit: 2016-02-07 | Discharge: 2016-02-07 | Disposition: A | Discharge status: Home / Self Care | Payer: Managed Care, Other (non HMO) | Source: Ambulatory Visit | Attending: Internal Medicine | Admitting: Internal Medicine

## 2016-02-07 DIAGNOSIS — R9431 Abnormal electrocardiogram [ECG] [EKG]: Secondary | ICD-10-CM

## 2016-02-07 DIAGNOSIS — R0602 Shortness of breath: Secondary | ICD-10-CM

## 2016-02-07 DIAGNOSIS — R079 Chest pain, unspecified: Secondary | ICD-10-CM | POA: Diagnosis present

## 2016-02-07 MED ORDER — TECHNETIUM TC 99M TETROFOSMIN IV KIT
31.1000 | PACK | Freq: Once | INTRAVENOUS | Status: AC | PRN
  Administered 2016-02-07: 31.1 via INTRAVENOUS
  Filled 2016-02-07: qty 32

## 2016-02-14 ENCOUNTER — Ambulatory Visit (HOSPITAL_COMMUNITY)
Admission: RE | Admit: 2016-02-14 | Discharge: 2016-02-14 | Disposition: A | Discharge status: Home / Self Care | Payer: Managed Care, Other (non HMO) | Source: Ambulatory Visit | Attending: Cardiology | Admitting: Cardiology

## 2016-02-14 LAB — MYOCARDIAL PERFUSION IMAGING
CHL CUP MPHR: 167 {beats}/min
CSEPED: 6 min
CSEPEDS: 41 s
CSEPEW: 8 METS
CSEPHR: 94 %
CSEPPHR: 157 {beats}/min
LV dias vol: 111 mL (ref 46–106)
LV sys vol: 52 mL
RPE: 17
Rest HR: 51 {beats}/min
TID: 1.12

## 2016-02-14 MED ORDER — TECHNETIUM TC 99M TETROFOSMIN IV KIT
29.2000 | PACK | Freq: Once | INTRAVENOUS | Status: AC | PRN
  Administered 2016-02-14: 29.2 via INTRAVENOUS

## 2016-02-21 ENCOUNTER — Ambulatory Visit (HOSPITAL_COMMUNITY): Payer: Managed Care, Other (non HMO) | Attending: Cardiology

## 2016-02-21 ENCOUNTER — Other Ambulatory Visit: Payer: Self-pay

## 2016-02-21 DIAGNOSIS — R079 Chest pain, unspecified: Secondary | ICD-10-CM | POA: Diagnosis not present

## 2016-02-21 DIAGNOSIS — R0602 Shortness of breath: Secondary | ICD-10-CM | POA: Insufficient documentation

## 2016-02-21 DIAGNOSIS — I340 Nonrheumatic mitral (valve) insufficiency: Secondary | ICD-10-CM | POA: Diagnosis not present

## 2016-02-21 DIAGNOSIS — R9431 Abnormal electrocardiogram [ECG] [EKG]: Secondary | ICD-10-CM | POA: Insufficient documentation

## 2016-02-21 DIAGNOSIS — I071 Rheumatic tricuspid insufficiency: Secondary | ICD-10-CM | POA: Insufficient documentation

## 2016-02-25 ENCOUNTER — Encounter: Payer: Self-pay | Admitting: Cardiovascular Disease

## 2016-02-25 DIAGNOSIS — I7121 Aneurysm of the ascending aorta, without rupture: Secondary | ICD-10-CM | POA: Insufficient documentation

## 2016-02-25 DIAGNOSIS — I712 Thoracic aortic aneurysm, without rupture: Secondary | ICD-10-CM

## 2016-02-25 HISTORY — DX: Aneurysm of the ascending aorta, without rupture: I71.21

## 2016-02-25 HISTORY — DX: Thoracic aortic aneurysm, without rupture: I71.2

## 2016-02-28 ENCOUNTER — Encounter: Payer: Self-pay | Admitting: Cardiovascular Disease

## 2016-02-28 ENCOUNTER — Ambulatory Visit (INDEPENDENT_AMBULATORY_CARE_PROVIDER_SITE_OTHER): Payer: Managed Care, Other (non HMO) | Admitting: Cardiovascular Disease

## 2016-02-28 VITALS — BP 139/88 | HR 56 | Ht 63.6 in | Wt 224.4 lb

## 2016-02-28 DIAGNOSIS — I712 Thoracic aortic aneurysm, without rupture: Secondary | ICD-10-CM

## 2016-02-28 DIAGNOSIS — I10 Essential (primary) hypertension: Secondary | ICD-10-CM | POA: Diagnosis not present

## 2016-02-28 DIAGNOSIS — R0789 Other chest pain: Secondary | ICD-10-CM

## 2016-02-28 DIAGNOSIS — I7121 Aneurysm of the ascending aorta, without rupture: Secondary | ICD-10-CM

## 2016-02-28 NOTE — Patient Instructions (Signed)
Medication Instructions: Your physician recommends that you continue on your current medications as directed. Please refer to the Current Medication list given to you today.   Testing/Procedures: Your physician has requested that you have an echocardiogram in 1 year. Echocardiography is a painless test that uses sound waves to create images of your heart. It provides your doctor with information about the size and shape of your heart and how well your heart's chambers and valves are working. This procedure takes approximately one hour. There are no restrictions for this procedure. This will be performed at [address].   Follow-Up: Your physician wants you to follow-up in: 1 year--schedule for after echo is completed. You will receive a reminder letter in the mail two months in advance. If you don't receive a letter, please call our office to schedule the follow-up appointment.  If you need a refill on your cardiac medications before your next appointment, please call your pharmacy.

## 2016-02-28 NOTE — Progress Notes (Signed)
Cardiology Office Note   Date:  02/28/2016   ID:  Audrey Farley, DOB [birth year], MRN [id number]  PCP:  Reginia Forts, MD  Cardiologist:   Skeet Latch, MD   Chief Complaint  Patient presents with  . Follow-up    PT HAS NO COMPLAINTS TODAY, F/U TESTS       History of Present Illness: Audrey Farley is a 55 y.o. female with hypertension who presents for follow up.  She saw Dr. Reginia Forts on 12/2015 and reported chest pain and shortness of breath.  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  She was referred for an exercise Myoview that revealed LVEF 53% with no ischemia.  She achieved 8 METS on the Bruce protocol.  She also had an echo that showed LVEF 55-60% and mild dilation of the ascending aorta (3.9 cm).  Since her last appointment [name] has been doing well.  She notes that her shortness of breath has improved.  She continues to have very occasional episodes of chest discomfort. The occur at rest and not with exertion.  She denies lower extremity edema, orthopnea or PND.  She hasn't been exercising because she is waiting to be cleared.   Past Medical History:  Diagnosis Date  . Allergy   . Aneurysm of ascending aorta (HCC) 02/25/2016   3.9cm by echo 02/2016  . Chest pain 01/24/2016  . Dyspnea on exertion   . Hypertension   . Urge incontinence     Past Surgical History:  Procedure Laterality Date  . ABDOMINAL HYSTERECTOMY     still has cervix; DUB; ovaries intact.     Current Outpatient Prescriptions  Medication Sig Dispense Refill  . oxybutynin (DITROPAN-XL) 10 MG 24 hr tablet Take 1 tablet (10 mg total) by mouth at bedtime. 90 tablet 3  . telmisartan-hydrochlorothiazide (MICARDIS HCT) 80-25 MG tablet Take 1 tablet by mouth daily. 90 tablet 3   No current facility-administered medications for this visit.     Allergies:   Patient has no known allergies.    Social History:  The patient  reports that she has never smoked. She  does not have any smokeless tobacco history on file. She reports that she drinks alcohol. She reports that she does not use drugs.   Family History:  The patient's family history includes Diabetes in her mother and sister; Epilepsy in her brother; Heart attack in her mother; Heart disease in her mother; Hyperlipidemia in her mother and sister; Hypertension in her mother and sister; Kidney disease in her mother; Mental illness in her brother; Multiple sclerosis in her sister.    ROS:  Please see the history of present illness.   Otherwise, review of systems are positive for none.   All other systems are reviewed and negative.    PHYSICAL EXAM: VS:  BP 139/88 (BP Location: Right Arm, Patient Position: Sitting, Cuff Size: Large)   Pulse (!) 56   Ht 5' 3.6" (1.615 m)   Wt 101.8 kg (224 lb 6.4 oz)   SpO2 98%   BMI 39.00 kg/m  , BMI Body mass index is 39 kg/m. GENERAL:  Well appearing HEENT:  Pupils equal round and reactive, fundi not visualized, oral mucosa unremarkable NECK:  No jugular venous distention, waveform within normal limits, carotid upstroke brisk and symmetric, no bruits LYMPHATICS:  No cervical adenopathy LUNGS:  Clear to auscultation bilaterally HEART:  RRR.  PMI not displaced or sustained,S1 and S2 within normal limits, no S3, no S4, no clicks,  no rubs, no murmurs ABD:  Flat, positive bowel sounds normal in frequency in pitch, no bruits, no rebound, no guarding, no midline pulsatile mass, no hepatomegaly, no splenomegaly EXT:  2 plus pulses throughout, no edema, no cyanosis no clubbing SKIN:  No rashes no nodules NEURO:  Cranial nerves II through XII grossly intact, motor grossly intact throughout PSYCH:  Cognitively intact, oriented to person place and time    EKG:  EKG is not ordered today. The ekg ordered 01/24/16 demonstrates sinus bradycardia.  Non-specific T wave abnormalities.   Exercise Myoview 02/14/16: The left ventricular ejection fraction is mildly decreased  (45-54%).  Nuclear stress EF: 53%.  Blood pressure demonstrated a hypertensive response to exercise.  Baseline EKG showed NSR with diffuse ST/T wave abnormality in the inferolateral leads. During stress there was 39mm J point depression from baseline with horizontal ST segment depression in the inferolateral leads. EKG nondiagnostic due to resting EKG changes.  The study is normal.  This is a low risk study.  Echo 02/21/16: Study Conclusions  - Left ventricle: The cavity size was normal. There was mild   concentric hypertrophy. Systolic function was normal. The   estimated ejection fraction was in the range of 55% to 60%. Wall   motion was normal; there were no regional wall motion   abnormalities. - Aortic valve: Trileaflet; normal thickness, mildly calcified   leaflets. - Aorta: Ascending aortic diameter: 39 mm (S). - Aortic root: The aortic root was normal in size. - Ascending aorta: The ascending aorta was mildly dilated. - Mitral valve: There was trivial regurgitation. - Tricuspid valve: There was trivial regurgitation.   Recent Labs: 12/20/2015: ALT 13; BUN 14; Creat 0.73; Hemoglobin 12.3; Platelets 234; Potassium 4.3; Sodium 141; TSH 0.81    Lipid Panel    Component Value Date/Time   CHOL 218 (H) 12/20/2015 1123   TRIG 43 12/20/2015 1123   HDL 105 12/20/2015 1123   CHOLHDL 2.1 12/20/2015 1123   VLDL 9 12/20/2015 1123   LDLCALC 104 12/20/2015 1123      Wt Readings from Last 3 Encounters:  02/28/16 101.8 kg (224 lb 6.4 oz)  02/07/16 99.8 kg (220 lb)  01/24/16 99.9 kg (220 lb 3.2 oz)      ASSESSMENT AND PLAN:  # Atypical chest pain: # Abnormal EKG:  [name]' chest pain was is atypical.  Stress testing was negative for ischemia.  We discussed the fact that it is likely related to either GERD or musculoskeletal. She is OK to resume exercise.  # Ascending aorta aneurysm: Echo showed that her ascending aorta is mildly enlarged.  We will repeat her echo  in 1 year.  # Hypertension: Blood pressure is well-controlled today.  Continue telmisartan/HCTZ.    # Obesity: Resume exercise and work on limiting caloric intake.  # CV Disease Risk: ASCVD 10 year risk is 2.7%. Therefore we will not start aspirin or statin at this time.    Current medicines are reviewed at length with the patient today.  The patient does not have concerns regarding medicines.  The following changes have been made:  no change  Labs/ tests ordered today include:   No orders of the defined types were placed in this encounter.    Disposition:   FU with Kamani Magnussen C. Oval Linsey, MD, [facility] in 1 year.   This note was written with the assistance of speech recognition software.  Please excuse any transcriptional errors.  Signed, Shaquia Berkley C. Oval Linsey, MD, [facility]  02/28/2016 8:56 AM  [facility]

## 2016-02-29 ENCOUNTER — Encounter: Payer: Self-pay | Admitting: Cardiovascular Disease

## 2016-03-27 ENCOUNTER — Encounter: Payer: Self-pay | Admitting: Family Medicine

## 2016-08-08 ENCOUNTER — Ambulatory Visit (INDEPENDENT_AMBULATORY_CARE_PROVIDER_SITE_OTHER): Payer: Managed Care, Other (non HMO) | Admitting: Family Medicine

## 2016-08-08 ENCOUNTER — Encounter: Payer: Self-pay | Admitting: Family Medicine

## 2016-08-08 VITALS — BP 171/94 | HR 54 | Temp 98.2°F | Resp 16 | Ht 63.0 in | Wt 226.1 lb

## 2016-08-08 DIAGNOSIS — R9431 Abnormal electrocardiogram [ECG] [EKG]: Secondary | ICD-10-CM | POA: Diagnosis not present

## 2016-08-08 DIAGNOSIS — I712 Thoracic aortic aneurysm, without rupture: Secondary | ICD-10-CM

## 2016-08-08 DIAGNOSIS — I10 Essential (primary) hypertension: Secondary | ICD-10-CM

## 2016-08-08 DIAGNOSIS — E559 Vitamin D deficiency, unspecified: Secondary | ICD-10-CM | POA: Insufficient documentation

## 2016-08-08 DIAGNOSIS — M1712 Unilateral primary osteoarthritis, left knee: Secondary | ICD-10-CM

## 2016-08-08 DIAGNOSIS — Z131 Encounter for screening for diabetes mellitus: Secondary | ICD-10-CM

## 2016-08-08 DIAGNOSIS — J301 Allergic rhinitis due to pollen: Secondary | ICD-10-CM

## 2016-08-08 DIAGNOSIS — K219 Gastro-esophageal reflux disease without esophagitis: Secondary | ICD-10-CM | POA: Diagnosis not present

## 2016-08-08 DIAGNOSIS — Z01818 Encounter for other preprocedural examination: Secondary | ICD-10-CM

## 2016-08-08 DIAGNOSIS — I7121 Aneurysm of the ascending aorta, without rupture: Secondary | ICD-10-CM

## 2016-08-08 LAB — POCT URINALYSIS DIP (MANUAL ENTRY)
BILIRUBIN UA: NEGATIVE
BILIRUBIN UA: NEGATIVE mg/dL
Glucose, UA: NEGATIVE mg/dL
Leukocytes, UA: NEGATIVE
Nitrite, UA: NEGATIVE
PH UA: 5.5 (ref 5.0–8.0)
RBC UA: NEGATIVE
Spec Grav, UA: 1.025 (ref 1.010–1.025)
Urobilinogen, UA: 0.2 E.U./dL

## 2016-08-08 NOTE — Progress Notes (Signed)
Subjective:    Patient ID: Joslyn Devon, female    DOB: 1960/09/24, 56 y.o.   MRN: [id number]  08/08/2016  Medical Clearance (Left leg surgery with Dr. Lorre Nick )   HPI This 56 y.o. female presents for evaluation of PREOPERATIVE CLEARANCE.   Scheduled for LEFT total knee replacment by Dr. Lorre Nick.; bone on bone and falling a lot.  No schedule yet for surgery; must have clearance.    When changed blood pressure medication six months ago, developed large bald spot on top of scalp; hair is growing back.  Exercising; does a lot of cardio and squats at the gym/[employer] three days per week.  Cardio treadmill and bike and stairmaster; each machine for 15 minutes each and treadmill 30 minutes; going slowly due to LEFT knee.  No chest pain, orthopnea, dyspnea on exertion, leg swelling.  LEFT knee does swell.  S/p echo and myoview in 02/2016 by Dr. Oval Linsey of cardiology due to chest pain, shortness of breath, abnormal EKG.  Myoview low risk; 2D-echo with EF 55%.  Cleared to resume exercise as symptoms atypical in nature.  At [facility] blood pressure 140/82 on 08/06/16 by PA [place]  Cannot tolerate Vitamin D; makes feel poorly.  BP Readings from Last 3 Encounters:  08/08/16 (!) 171/94  02/28/16 139/88  01/24/16 132/88   Wt Readings from Last 3 Encounters:  08/08/16 226 lb 2 oz (102.6 kg)  02/28/16 224 lb 6.4 oz (101.8 kg)  02/07/16 220 lb (99.8 kg)   Immunization History  Administered Date(s) Administered  . Influenza,inj,Quad PF,36+ Mos 12/20/2015  . Tdap 08/31/2014    Review of Systems  Constitutional: Negative for chills, diaphoresis, fatigue and fever.  Eyes: Negative for visual disturbance.  Respiratory: Negative for cough and shortness of breath.   Cardiovascular: Negative for chest pain, palpitations and leg swelling.  Gastrointestinal: Negative for abdominal pain, constipation, diarrhea, nausea and vomiting.  Endocrine: Negative for cold intolerance, heat intolerance,  polydipsia, polyphagia and polyuria.  Musculoskeletal: Positive for arthralgias.  Neurological: Negative for dizziness, tremors, seizures, syncope, facial asymmetry, speech difficulty, weakness, light-headedness, numbness and headaches.    Past Medical History:  Diagnosis Date  . Allergy   . Aneurysm of ascending aorta (HCC) 02/25/2016   3.9cm by echo 02/2016  . Chest pain 01/24/2016  . Dyspnea on exertion   . Hypertension   . Urge incontinence    Past Surgical History:  Procedure Laterality Date  . ABDOMINAL HYSTERECTOMY     still has cervix; DUB; ovaries intact.   No Known Allergies  Social History   Social History  . Marital status: Single    Spouse name: N/A  . Number of children: N/A  . Years of education: N/A   Occupational History  . Not on file.   Social History Main Topics  . Smoking status: Never Smoker  . Smokeless tobacco: Never Used  . Alcohol use Yes  . Drug use: No  . Sexual activity: Yes    Birth control/ protection: Surgical   Other Topics Concern  . Not on file   Social History Narrative   Marital status: single; not dating since 2014      Children: 2 daughters (15, 41); no grandchildren      Lives: with 2 daughters      Employment: tobacco company; Stage manager x 14 years; loves job!      Tobacco: none      Alcohol; weekends/socially      Drugs: none  Exercise:  Two days per week; cardio      Seatbelt: 100%; no texting      Sexual activity: not sexually active since 2014; dates males; no STDs; total partners = 7.   Family History  Problem Relation Age of Onset  . Diabetes Mother   . Hypertension Mother   . Heart attack Mother   . Hyperlipidemia Mother   . Heart disease Mother        heart problems; no AMI  . Kidney disease Mother   . Hyperlipidemia Sister   . Hypertension Sister   . Multiple sclerosis Sister   . Mental illness Brother   . Epilepsy Brother   . Diabetes Sister   . Sudden death Neg Hx          Objective:    BP (!) 171/94   Pulse (!) 54   Temp 98.2 F (36.8 C) (Oral)   Resp 16   Ht 5\' 3"  (1.6 m)   Wt 226 lb 2 oz (102.6 kg)   SpO2 98%   BMI 40.06 kg/m  Physical Exam  Constitutional: She is oriented to person, place, and time. She appears well-developed and well-nourished. No distress.  HENT:  Head: Normocephalic and atraumatic.  Right Ear: External ear normal.  Left Ear: External ear normal.  Nose: Nose normal.  Mouth/Throat: Oropharynx is clear and moist.  Eyes: Conjunctivae and EOM are normal. Pupils are equal, round, and reactive to light.  Neck: Normal range of motion. Neck supple. Carotid bruit is not present. No thyromegaly present.  Cardiovascular: Normal rate, regular rhythm, normal heart sounds and intact distal pulses.  Exam reveals no gallop and no friction rub.   No murmur heard. Pulmonary/Chest: Effort normal and breath sounds normal. She has no wheezes. She has no rales.  Abdominal: Soft. Bowel sounds are normal. She exhibits no distension and no mass. There is no tenderness. There is no rebound and no guarding.  Lymphadenopathy:    She has no cervical adenopathy.  Neurological: She is alert and oriented to person, place, and time. No cranial nerve deficit.  Skin: Skin is warm and dry. No rash noted. She is not diaphoretic. No erythema. No pallor.  Psychiatric: She has a normal mood and affect. Her behavior is normal.   Results for orders placed or performed during the hospital encounter of 02/07/16  Myocardial Perfusion Imaging  Result Value Ref Range   Rest HR 51 bpm   Rest BP 149/93 mmHg   RPE 17    Exercise duration (sec) 41 sec   Percent HR 94 %   Exercise duration (min) 6 min   Estimated workload 8.0 METS   Peak HR 157 bpm   Peak BP 138/102 mmHg   MPHR 167 bpm   TID 1.12    LV sys vol 52 mL   LV dias vol 111 46 - 106 mL   2D-echo Study Conclusions from 02/21/16:  - Left ventricle: The cavity size was normal. There was mild    concentric hypertrophy. Systolic function was normal. The   estimated ejection fraction was in the range of 55% to 60%. Wall   motion was normal; there were no regional wall motion   abnormalities. - Aortic valve: Trileaflet; normal thickness, mildly calcified   leaflets. - Aorta: Ascending aortic diameter: 39 mm (S). - Aortic root: The aortic root was normal in size. - Ascending aorta: The ascending aorta was mildly dilated. - Mitral valve: There was trivial regurgitation. - Tricuspid valve:  There was trivial regurgitation.     Assessment & Plan:   1. Essential hypertension   2. Aneurysm of ascending aorta (HCC)   3. Gastroesophageal reflux disease without esophagitis   4. Seasonal allergic rhinitis due to pollen   5. Vitamin D deficiency   6. Screening for diabetes mellitus   7. Preoperative clearance    -uncontrolled blood pressure today yet recent readings moderately controlled. -asymptomatic with ability to exercise without chest pain or  Dyspnea. -s/p cardiology consultation with myoview stress testing and echo within the past six months; work up negative and cleared to resume exercise in 02/2016.   -obtain labs. -monitor blood pressure daily at home. Call if readings > 140/90.    Orders Placed This Encounter  Procedures  . CBC with Differential/Platelet  . Comprehensive metabolic panel  . Hemoglobin A1c  . POCT urinalysis dipstick  . EKG 12-Lead   Meds ordered this encounter  Medications  . telmisartan (MICARDIS) 20 MG tablet    Sig: Take 20 mg by mouth.    No Follow-up on file.   Zenora Karpel Elayne Guerin, M.D. Primary Care at [facility] [address] [place], [place]  [id number] [phone] phone [phone] fax

## 2016-08-08 NOTE — Patient Instructions (Addendum)
   IF you received an x-ray today, you will receive an invoice from [facility]. Please contact [facility] at [phone] with questions or concerns regarding your invoice.   IF you received labwork today, you will receive an invoice from [facility]. Please contact [facility] at [phone] with questions or concerns regarding your invoice.   Our billing staff will not be able to assist you with questions regarding bills from these companies.  You will be contacted with the lab results as soon as they are available. The fastest way to get your results is to activate your My Chart account. Instructions are located on the last page of this paperwork. If you have not heard from us regarding the results in 2 weeks, please contact this office.     DASH Eating Plan DASH stands for "Dietary Approaches to Stop Hypertension." The DASH eating plan is a healthy eating plan that has been shown to reduce high blood pressure (hypertension). It may also reduce your risk for type 2 diabetes, heart disease, and stroke. The DASH eating plan may also help with weight loss. What are tips for following this plan? General guidelines   Avoid eating more than 2,300 mg (milligrams) of salt (sodium) a day. If you have hypertension, you may need to reduce your sodium intake to 1,500 mg a day.  Limit alcohol intake to no more than 1 drink a day for nonpregnant women and 2 drinks a day for men. One drink equals 12 oz of beer, 5 oz of wine, or 1 oz of hard liquor.  Work with your health care provider to maintain a healthy body weight or to lose weight. Ask what an ideal weight is for you.  Get at least 30 minutes of exercise that causes your heart to beat faster (aerobic exercise) most days of the week. Activities may include walking, swimming, or biking.  Work with your health care provider or diet and nutrition specialist (dietitian) to adjust your eating plan to your individual calorie  needs. Reading food labels   Check food labels for the amount of sodium per serving. Choose foods with less than 5 percent of the Daily Value of sodium. Generally, foods with less than 300 mg of sodium per serving fit into this eating plan.  To find whole grains, look for the word "whole" as the first word in the ingredient list. Shopping   Buy products labeled as "low-sodium" or "no salt added."  Buy fresh foods. Avoid canned foods and premade or frozen meals. Cooking   Avoid adding salt when cooking. Use salt-free seasonings or herbs instead of table salt or sea salt. Check with your health care provider or pharmacist before using salt substitutes.  Do not fry foods. Cook foods using healthy methods such as baking, boiling, grilling, and broiling instead.  Cook with heart-healthy oils, such as olive, canola, soybean, or sunflower oil. Meal planning    Eat a balanced diet that includes:  5 or more servings of fruits and vegetables each day. At each meal, try to fill half of your plate with fruits and vegetables.  Up to 6-8 servings of whole grains each day.  Less than 6 oz of lean meat, poultry, or fish each day. A 3-oz serving of meat is about the same size as a deck of cards. One egg equals 1 oz.  2 servings of low-fat dairy each day.  A serving of nuts, seeds, or beans 5 times each week.  Heart-healthy fats. Healthy fats called   Omega-3 fatty acids are found in foods such as flaxseeds and coldwater fish, like sardines, salmon, and mackerel.  Limit how much you eat of the following:  Canned or prepackaged foods.  Food that is high in trans fat, such as fried foods.  Food that is high in saturated fat, such as fatty meat.  Sweets, desserts, sugary drinks, and other foods with added sugar.  Full-fat dairy products.  Do not salt foods before eating.  Try to eat at least 2 vegetarian meals each week.  Eat more home-cooked food and less restaurant, buffet, and fast  food.  When eating at a restaurant, ask that your food be prepared with less salt or no salt, if possible. What foods are recommended? The items listed may not be a complete list. Talk with your dietitian about what dietary choices are best for you. Grains  Whole-grain or whole-wheat bread. Whole-grain or whole-wheat pasta. Brown rice. Oatmeal. Quinoa. Bulgur. Whole-grain and low-sodium cereals. Pita bread. Low-fat, low-sodium crackers. Whole-wheat flour tortillas. Vegetables  Fresh or frozen vegetables (raw, steamed, roasted, or grilled). Low-sodium or reduced-sodium tomato and vegetable juice. Low-sodium or reduced-sodium tomato sauce and tomato paste. Low-sodium or reduced-sodium canned vegetables. Fruits  All fresh, dried, or frozen fruit. Canned fruit in natural juice (without added sugar). Meat and other protein foods  Skinless chicken or turkey. Ground chicken or turkey. Pork with fat trimmed off. Fish and seafood. Egg whites. Dried beans, peas, or lentils. Unsalted nuts, nut butters, and seeds. Unsalted canned beans. Lean cuts of beef with fat trimmed off. Low-sodium, lean deli meat. Dairy  Low-fat (1%) or fat-free (skim) milk. Fat-free, low-fat, or reduced-fat cheeses. Nonfat, low-sodium ricotta or cottage cheese. Low-fat or nonfat yogurt. Low-fat, low-sodium cheese. Fats and oils  Soft margarine without trans fats. Vegetable oil. Low-fat, reduced-fat, or light mayonnaise and salad dressings (reduced-sodium). Canola, safflower, olive, soybean, and sunflower oils. Avocado. Seasoning and other foods  Herbs. Spices. Seasoning mixes without salt. Unsalted popcorn and pretzels. Fat-free sweets. What foods are not recommended? The items listed may not be a complete list. Talk with your dietitian about what dietary choices are best for you. Grains  Baked goods made with fat, such as croissants, muffins, or some breads. Dry pasta or rice meal packs. Vegetables  Creamed or fried vegetables.  Vegetables in a cheese sauce. Regular canned vegetables (not low-sodium or reduced-sodium). Regular canned tomato sauce and paste (not low-sodium or reduced-sodium). Regular tomato and vegetable juice (not low-sodium or reduced-sodium). Pickles. Olives. Fruits  Canned fruit in a light or heavy syrup. Fried fruit. Fruit in cream or butter sauce. Meat and other protein foods  Fatty cuts of meat. Ribs. Fried meat. Bacon. Sausage. Bologna and other processed lunch meats. Salami. Fatback. Hotdogs. Bratwurst. Salted nuts and seeds. Canned beans with added salt. Canned or smoked fish. Whole eggs or egg yolks. Chicken or turkey with skin. Dairy  Whole or 2% milk, cream, and half-and-half. Whole or full-fat cream cheese. Whole-fat or sweetened yogurt. Full-fat cheese. Nondairy creamers. Whipped toppings. Processed cheese and cheese spreads. Fats and oils  Butter. Stick margarine. Lard. Shortening. Ghee. Bacon fat. Tropical oils, such as coconut, palm kernel, or palm oil. Seasoning and other foods  Salted popcorn and pretzels. Onion salt, garlic salt, seasoned salt, table salt, and sea salt. Worcestershire sauce. Tartar sauce. Barbecue sauce. Teriyaki sauce. Soy sauce, including reduced-sodium. Steak sauce. Canned and packaged gravies. Fish sauce. Oyster sauce. Cocktail sauce. Horseradish that you find on the shelf. Ketchup. Mustard. Meat flavorings   and tenderizers. Bouillon cubes. Hot sauce and Tabasco sauce. Premade or packaged marinades. Premade or packaged taco seasonings. Relishes. Regular salad dressings. Where to find more information:  National Heart, Lung, and Blood Institute: www.nhlbi.nih.gov  American Heart Association: www.heart.org Summary  The DASH eating plan is a healthy eating plan that has been shown to reduce high blood pressure (hypertension). It may also reduce your risk for type 2 diabetes, heart disease, and stroke.  With the DASH eating plan, you should limit salt (sodium) intake  to 2,300 mg a day. If you have hypertension, you may need to reduce your sodium intake to 1,500 mg a day.  When on the DASH eating plan, aim to eat more fresh fruits and vegetables, whole grains, lean proteins, low-fat dairy, and heart-healthy fats.  Work with your health care provider or diet and nutrition specialist (dietitian) to adjust your eating plan to your individual calorie needs. This information is not intended to replace advice given to you by your health care provider. Make sure you discuss any questions you have with your health care provider. Document Released: 02/12/2011 Document Revised: 02/17/2016 Document Reviewed: 02/17/2016 Elsevier Interactive Patient Education  2017 Elsevier Inc.  

## 2016-08-09 LAB — CBC WITH DIFFERENTIAL/PLATELET
BASOS ABS: 0 10*3/uL (ref 0.0–0.2)
Basos: 0 %
EOS (ABSOLUTE): 0 10*3/uL (ref 0.0–0.4)
Eos: 0 %
HEMOGLOBIN: 12.4 g/dL (ref 11.1–15.9)
Hematocrit: 37.6 % (ref 34.0–46.6)
IMMATURE GRANS (ABS): 0 10*3/uL (ref 0.0–0.1)
IMMATURE GRANULOCYTES: 0 %
LYMPHS: 16 %
Lymphocytes Absolute: 1.8 10*3/uL (ref 0.7–3.1)
MCH: 28.9 pg (ref 26.6–33.0)
MCHC: 33 g/dL (ref 31.5–35.7)
MCV: 88 fL (ref 79–97)
MONOCYTES: 6 %
Monocytes Absolute: 0.6 10*3/uL (ref 0.1–0.9)
NEUTROS PCT: 78 %
Neutrophils Absolute: 8.6 10*3/uL — ABNORMAL HIGH (ref 1.4–7.0)
PLATELETS: 214 10*3/uL (ref 150–379)
RBC: 4.29 x10E6/uL (ref 3.77–5.28)
RDW: 12.8 % (ref 12.3–15.4)
WBC: 11 10*3/uL — ABNORMAL HIGH (ref 3.4–10.8)

## 2016-08-09 LAB — COMPREHENSIVE METABOLIC PANEL
ALBUMIN: 4.4 g/dL (ref 3.5–5.5)
ALT: 16 IU/L (ref 0–32)
AST: 13 IU/L (ref 0–40)
Albumin/Globulin Ratio: 1.6 (ref 1.2–2.2)
Alkaline Phosphatase: 100 IU/L (ref 39–117)
BUN/Creatinine Ratio: 24 — ABNORMAL HIGH (ref 9–23)
BUN: 22 mg/dL (ref 6–24)
Bilirubin Total: 0.3 mg/dL (ref 0.0–1.2)
CALCIUM: 9.6 mg/dL (ref 8.7–10.2)
CO2: 26 mmol/L (ref 18–29)
CREATININE: 0.91 mg/dL (ref 0.57–1.00)
Chloride: 105 mmol/L (ref 96–106)
GFR, EST AFRICAN AMERICAN: 83 mL/min/{1.73_m2} (ref >59)
GFR, EST NON AFRICAN AMERICAN: 72 mL/min/{1.73_m2} (ref >59)
GLUCOSE: 105 mg/dL — AB (ref 65–99)
Globulin, Total: 2.7 g/dL (ref 1.5–4.5)
Potassium: 3.9 mmol/L (ref 3.5–5.2)
Sodium: 144 mmol/L (ref 134–144)
TOTAL PROTEIN: 7.1 g/dL (ref 6.0–8.5)

## 2016-08-09 LAB — HEMOGLOBIN A1C
Est. average glucose Bld gHb Est-mCnc: 114 mg/dL
Hgb A1c MFr Bld: 5.6 % (ref 4.8–5.6)

## 2016-08-17 ENCOUNTER — Telehealth: Payer: Self-pay | Admitting: Family Medicine

## 2016-08-17 NOTE — Telephone Encounter (Signed)
Pt called stated that there was suppose to obe some paper work being faxed Dr. Tamala Julian told pt that she would fax ppw after her lab results came in pt stated its been over a week now  Please advise: [phone]

## 2016-08-18 NOTE — Telephone Encounter (Signed)
I am sending this message to you Audrey Farley, hopping that you may know anything about the forms patient need it for her surgery. She had a visit with Dr. Tamala Julian for clearance on 6/2.

## 2016-08-18 NOTE — Telephone Encounter (Signed)
The form is not in Dr. Thompson Caul box, nor in mine (I am covering for her during her absence).  Last OV note reviewed. No declaration of clearance or need for additional evaluation stated. No form scanned into record. No letter in Letters section of the chart. No referrals to cardiology, pulmonology noted.  Please call Dr. Jeoffrey Massed office to inquire if they received the form, and if not, ask that they send [place] a new one. As surgery has not yet been scheduled, advise Dr. Jeoffrey Massed staff that the form will be completed upon Dr. Thompson Caul return in 2 weeks.

## 2016-08-19 DIAGNOSIS — M1712 Unilateral primary osteoarthritis, left knee: Secondary | ICD-10-CM | POA: Insufficient documentation

## 2016-08-21 NOTE — Telephone Encounter (Signed)
Left message to see if medical clearance form was received; if not, can they fax new form.  Fax number provided

## 2016-08-23 NOTE — Telephone Encounter (Signed)
Dr. Oval Linsey, You recently evaluated patient for chest pain and dyspnea; patient is s/p myoview and echo.  You cleared patient in 02/2016 to proceed with exercise.  She is currently asymptomatic with her current exercise regimen per my office visit on 08/08/16.  Do you need to see patient prior to her proceeding with total knee replacement?

## 2016-08-23 NOTE — Telephone Encounter (Signed)
Hi Dr. Tamala Julian.  No I don't need to see her.  Sounds like she is OK for surgery.  Thanks, [place]

## 2016-08-24 NOTE — Telephone Encounter (Signed)
Noted and agree.  Appreciate Dr. Blenda Mounts review and input.  PA Jeffery, please complete surgical clearance form in my absence to clear patient for surgery.  Thanks.

## 2016-08-25 ENCOUNTER — Telehealth: Payer: Self-pay | Admitting: Family Medicine

## 2016-08-25 NOTE — Telephone Encounter (Signed)
FAXED PT SURGICAL CLEARANCE PAPERWORK ON 08-25-16 AT 11:14AM

## 2016-08-25 NOTE — Telephone Encounter (Signed)
Form signed. Please fax

## 2016-08-27 ENCOUNTER — Other Ambulatory Visit: Payer: Self-pay | Admitting: Orthopedic Surgery

## 2016-08-31 ENCOUNTER — Encounter: Payer: Self-pay | Admitting: Family Medicine

## 2016-09-17 NOTE — Pre-Procedure Instructions (Signed)
Towanna Avery  09/17/2016      [id number] - [place], [place] [address] Phone: [phone] Fax: [phone]  [place], [place] [address] Phone: [phone] Fax: [phone]    Your procedure is scheduled on September 28, 2016.  Report to [facility] Admitting at 615 AM.  Call this number if you have problems the morning of surgery:  [phone]   Remember:  Do not eat food or drink liquids after midnight.  Take these medicines the morning of surgery with A SIP OF WATER (none).  7 days prior to surgery STOP taking any Aspirin, Aleve, Naproxen, Ibuprofen, Motrin, Advil, Goody's, BC's, all herbal medications, fish oil, and all vitamins   Do not wear jewelry, make-up or nail polish.  Do not wear lotions, powders, or perfumes, or deoderant.  Do not shave 48 hours prior to surgery.  Men may shave face and neck.  Do not bring valuables to the hospital.  [facility] is not responsible for any belongings or valuables.  Contacts, dentures or bridgework may not be worn into surgery.  Leave your suitcase in the car.  After surgery it may be brought to your room.  For patients admitted to the hospital, discharge time will be determined by your treatment team.  Patients discharged the day of surgery will not be allowed to drive home.   Special instructions:   [place]- Preparing For Surgery  Before surgery, you can play an important role. Because skin is not sterile, your skin needs to be as free of germs as possible. You can reduce the number of germs on your skin by washing with CHG (chlorahexidine gluconate) Soap before surgery.  CHG is an antiseptic cleaner which kills germs and bonds with the skin to continue killing germs even after washing.  Please do not use if you have an allergy to CHG or antibacterial soaps. If your skin  becomes reddened/irritated stop using the CHG.  Do not shave (including legs and underarms) for at least 48 hours prior to first CHG shower. It is OK to shave your face.  Please follow these instructions carefully.   1. Shower the NIGHT BEFORE SURGERY and the MORNING OF SURGERY with CHG.   2. If you chose to wash your hair, wash your hair first as usual with your normal shampoo.  3. After you shampoo, rinse your hair and body thoroughly to remove the shampoo.  4. Use CHG as you would any other liquid soap. You can apply CHG directly to the skin and wash gently with a scrungie or a clean washcloth.   5. Apply the CHG Soap to your body ONLY FROM THE NECK DOWN.  Do not use on open wounds or open sores. Avoid contact with your eyes, ears, mouth and genitals (private parts). Wash genitals (private parts) with your normal soap.  6. Wash thoroughly, paying special attention to the area where your surgery will be performed.  7. Thoroughly rinse your body with warm water from the neck down.  8. DO NOT shower/wash with your normal soap after using and rinsing off the CHG Soap.  9. Pat yourself dry with a CLEAN TOWEL.   10. Wear CLEAN PAJAMAS   11. Place CLEAN SHEETS on your bed the night of your first shower and DO NOT SLEEP WITH PETS.    Day of Surgery: Do not  apply any deodorants/lotions. Please wear clean clothes to the hospital/surgery center.     Please read over the following fact sheets that you were given. Pain Booklet, Coughing and Deep Breathing, MRSA Information and Surgical Site Infection Prevention

## 2016-09-18 ENCOUNTER — Encounter (HOSPITAL_COMMUNITY): Payer: Self-pay

## 2016-09-18 ENCOUNTER — Encounter (HOSPITAL_COMMUNITY)
Admission: RE | Admit: 2016-09-18 | Discharge: 2016-09-18 | Disposition: A | Discharge status: Home / Self Care | Payer: Managed Care, Other (non HMO) | Source: Ambulatory Visit | Attending: Orthopedic Surgery | Admitting: Orthopedic Surgery

## 2016-09-18 DIAGNOSIS — Z01812 Encounter for preprocedural laboratory examination: Secondary | ICD-10-CM | POA: Insufficient documentation

## 2016-09-18 DIAGNOSIS — M1712 Unilateral primary osteoarthritis, left knee: Secondary | ICD-10-CM | POA: Insufficient documentation

## 2016-09-18 HISTORY — DX: Unspecified osteoarthritis, unspecified site: M19.90

## 2016-09-18 LAB — CBC WITH DIFFERENTIAL/PLATELET
BASOS ABS: 0 10*3/uL (ref 0.0–0.1)
Basophils Relative: 1 %
EOS ABS: 0.1 10*3/uL (ref 0.0–0.7)
EOS PCT: 2 %
HCT: 37.2 % (ref 36.0–46.0)
Hemoglobin: 12.4 g/dL (ref 12.0–15.0)
LYMPHS PCT: 41 %
Lymphs Abs: 2.1 10*3/uL (ref 0.7–4.0)
MCH: 28.4 pg (ref 26.0–34.0)
MCHC: 33.3 g/dL (ref 30.0–36.0)
MCV: 85.3 fL (ref 78.0–100.0)
MONO ABS: 0.4 10*3/uL (ref 0.1–1.0)
Monocytes Relative: 8 %
Neutro Abs: 2.5 10*3/uL (ref 1.7–7.7)
Neutrophils Relative %: 48 %
PLATELETS: 237 10*3/uL (ref 150–400)
RBC: 4.36 MIL/uL (ref 3.87–5.11)
RDW: 12.3 % (ref 11.5–15.5)
WBC: 5.1 10*3/uL (ref 4.0–10.5)

## 2016-09-18 LAB — COMPREHENSIVE METABOLIC PANEL
ALT: 14 U/L (ref 14–54)
AST: 16 U/L (ref 15–41)
Albumin: 3.9 g/dL (ref 3.5–5.0)
Alkaline Phosphatase: 98 U/L (ref 38–126)
Anion gap: 8 (ref 5–15)
BUN: 13 mg/dL (ref 6–20)
CHLORIDE: 104 mmol/L (ref 101–111)
CO2: 25 mmol/L (ref 22–32)
Calcium: 9.4 mg/dL (ref 8.9–10.3)
Creatinine, Ser: 1.01 mg/dL — ABNORMAL HIGH (ref 0.44–1.00)
Glucose, Bld: 99 mg/dL (ref 65–99)
POTASSIUM: 3.7 mmol/L (ref 3.5–5.1)
Sodium: 137 mmol/L (ref 135–145)
Total Bilirubin: 0.8 mg/dL (ref 0.3–1.2)
Total Protein: 7 g/dL (ref 6.5–8.1)

## 2016-09-18 LAB — SURGICAL PCR SCREEN
MRSA, PCR: NEGATIVE
STAPHYLOCOCCUS AUREUS: POSITIVE — AB

## 2016-09-18 NOTE — Pre-Procedure Instructions (Addendum)
Audrey Farley  09/18/2016      [id number] - [place], [place] Phone: [phone] Fax: [phone]  Audrey Farley, Audrey Farley Phone: [phone] Fax: [phone]    Your procedure is scheduled on September 28, 2016.  Report to [facility] Admitting at 615 AM.  Call this number if you have problems the morning of surgery:  [phone]   Remember:  Do not eat food or drink liquids after midnight.  Take these medicines the morning of surgery with A SIP OF WATER (none).  7 days prior to surgery 09/21/16 STOP taking any Aspirin, Aleve, Naproxen, Ibuprofen, Motrin, Advil, Goody's, BC's, all herbal medications, fish oil, and all vitamins   Do not wear jewelry, make-up or nail polish.  Do not wear lotions, powders, or perfumes, or deoderant.  Do not shave 48 hours prior to surgery.  Men may shave face and neck.  Do not bring valuables to the hospital.  [facility] is not responsible for any belongings or valuables.  Contacts, dentures or bridgework may not be worn into surgery.  Leave your suitcase in the car.  After surgery it may be brought to your room.  For patients admitted to the hospital, discharge time will be determined by your treatment team.  Patients discharged the day of surgery will not be allowed to drive home.   Special instructions:   [place]- Preparing For Surgery  Before surgery, you can play an important role. Because skin is not sterile, your skin needs to be as free of germs as possible. You can reduce the number of germs on your skin by washing with CHG (chlorahexidine gluconate) Soap before surgery.  CHG is an antiseptic cleaner which kills germs and bonds with the skin to continue killing germs even after washing.  Please do not use if you have an allergy to CHG or antibacterial soaps. If  your skin becomes reddened/irritated stop using the CHG.  Do not shave (including legs and underarms) for at least 48 hours prior to first CHG shower. It is OK to shave your face.  Please follow these instructions carefully.   1. Shower the NIGHT BEFORE SURGERY and the MORNING OF SURGERY with CHG.   2. If you chose to wash your hair, wash your hair first as usual with your normal shampoo.  3. After you shampoo, rinse your hair and body thoroughly to remove the shampoo.  4. Use CHG as you would any other liquid soap. You can apply CHG directly to the skin and wash gently with a scrungie or a clean washcloth.   5. Apply the CHG Soap to your body ONLY FROM THE NECK DOWN.  Do not use on open wounds or open sores. Avoid contact with your eyes, ears, mouth and genitals (private parts). Wash genitals (private parts) with your normal soap.  6. Wash thoroughly, paying special attention to the area where your surgery will be performed.  7. Thoroughly rinse your body with warm water from the neck down.  8. DO NOT shower/wash with your normal soap after using and rinsing off the CHG Soap.  9. Pat yourself dry with a CLEAN TOWEL.   10. Wear CLEAN PAJAMAS   11. Place CLEAN SHEETS on your bed the night of your first shower and DO NOT SLEEP WITH PETS.    Day of Surgery: Do  not apply any deodorants/lotions. Please wear clean clothes to the hospital/surgery center.     Please read over the following fact sheets that you were given. Pain Booklet, Coughing and Deep Breathing, MRSA Information and Surgical Site Infection Prevention

## 2016-09-21 ENCOUNTER — Encounter (HOSPITAL_COMMUNITY): Payer: Self-pay

## 2016-09-21 NOTE — Progress Notes (Signed)
Anesthesia Chart Review:  Pt is a 56 year old female scheduled for L total knee arthroplasty on 09/28/2016 with Vickey Huger, MD  - PCP is Reginia Forts, MD who cleared pt for surgery - Cardiologist is Skeet Latch, MD who cleared pt for surgery.   PMH includes:  HTN, ascending aortic aneurysm (3.9cm by echo 02/21/16).  Never smoker. BMI 41  Medications include: telmisartan-HCTZ  BP (!) 158/80   Pulse (!) 55   Temp 36.6 C   Resp 20   Ht 5\' 3"  (1.6 m)   Wt 231 lb 12.8 oz (105.1 kg)   SpO2 99%   BMI 41.06 kg/m    Preoperative labs reviewed.  EKG 08/08/16: Marked atrial  Bradycardia  (46 bpm). 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 Consider old anterior infarct.   Echo 02/21/16:  - Left ventricle: The cavity size was normal. There was mild concentric hypertrophy. Systolic function was normal. The estimated ejection fraction was in the range of 55% to 60%. Wall motion was normal; there were no regional wall motion abnormalities. - Aortic valve: Trileaflet; normal thickness, mildly calcified   leaflets. - Aorta: Ascending aortic diameter: 39 mm (S). - Aortic root: The aortic root was normal in size. - Ascending aorta: The ascending aorta was mildly dilated. - Mitral valve: There was trivial regurgitation. - Tricuspid valve: There was trivial regurgitation.  Nuclear stress test 02/14/16:   The left ventricular ejection fraction is mildly decreased (45-54%).  Nuclear stress EF: 53%.  Blood pressure demonstrated a hypertensive response to exercise.  Baseline EKG showed NSR with diffuse ST/T wave abnormality in the inferolateral leads. During stress there was 79mm J point depression from baseline with horizontal ST segment depression in the inferolateral leads. EKG nondiagnostic due to resting EKG changes.  The study is normal.  This is a low risk study.  If no changes, I anticipate pt can proceed with surgery as scheduled.   Willeen Cass, FNP-BC [facility]  [facility]/Anesthesiology Phone: [phone] 09/21/2016 4:11 PM

## 2016-09-25 MED ORDER — ACETAMINOPHEN 500 MG PO TABS
1000.0000 mg | ORAL_TABLET | Freq: Once | ORAL | Status: AC
  Administered 2016-09-28: 1000 mg via ORAL
  Filled 2016-09-25: qty 2

## 2016-09-25 MED ORDER — DEXTROSE 5 % IV SOLN
3.0000 g | INTRAVENOUS | Status: AC
  Administered 2016-09-28: 3 g via INTRAVENOUS
  Filled 2016-09-25: qty 3000

## 2016-09-25 MED ORDER — DEXAMETHASONE SODIUM PHOSPHATE 10 MG/ML IJ SOLN
8.0000 mg | INTRAMUSCULAR | Status: AC
  Administered 2016-09-28: 8 mg via INTRAVENOUS
  Filled 2016-09-25: qty 1

## 2016-09-25 MED ORDER — SODIUM CHLORIDE 0.9 % IV SOLN
1000.0000 mg | INTRAVENOUS | Status: AC
  Administered 2016-09-28: 1000 mg via INTRAVENOUS
  Filled 2016-09-25: qty 1100

## 2016-09-25 MED ORDER — GABAPENTIN 300 MG PO CAPS
300.0000 mg | ORAL_CAPSULE | Freq: Once | ORAL | Status: AC
  Administered 2016-09-28: 300 mg via ORAL
  Filled 2016-09-25: qty 1

## 2016-09-25 MED ORDER — BUPIVACAINE LIPOSOME 1.3 % IJ SUSP
20.0000 mL | INTRAMUSCULAR | Status: AC
  Administered 2016-09-28: 20 mL
  Filled 2016-09-25: qty 20

## 2016-09-27 NOTE — Anesthesia Preprocedure Evaluation (Addendum)
Anesthesia Evaluation  Patient identified by MRN, date of birth, ID band Patient awake    Reviewed: Allergy & Precautions, NPO status , Patient's Chart, lab work & pertinent test results  History of Anesthesia Complications Negative for: history of anesthetic complications  Airway Mallampati: II  TM Distance: >3 FB Neck ROM: Full    Dental no notable dental hx.    Pulmonary neg pulmonary ROS,    breath sounds clear to auscultation       Cardiovascular hypertension, + Peripheral Vascular Disease   Rhythm:Regular Rate:Normal  Echo results noted   Neuro/Psych negative neurological ROS     GI/Hepatic GERD  ,  Endo/Other  Morbid obesity  Renal/GU      Musculoskeletal  (+) Arthritis ,   Abdominal (+) + obese,   Peds  Hematology   Anesthesia Other Findings   Reproductive/Obstetrics                           Anesthesia Physical Anesthesia Plan  ASA: II  Anesthesia Plan:    Post-op Pain Management:    Induction:   PONV Risk Score and Plan: 3 and Ondansetron, Dexamethasone, Propofol and Midazolam  Airway Management Planned:   Additional Equipment:   Intra-op Plan:   Post-operative Plan:   Informed Consent: I have reviewed the patients History and Physical, chart, labs and discussed the procedure including the risks, benefits and alternatives for the proposed anesthesia with the patient or authorized representative who has indicated his/her understanding and acceptance.     Plan Discussed with:   Anesthesia Plan Comments:        Anesthesia Quick Evaluation

## 2016-09-28 ENCOUNTER — Ambulatory Visit (HOSPITAL_COMMUNITY): Payer: Managed Care, Other (non HMO) | Admitting: Emergency Medicine

## 2016-09-28 ENCOUNTER — Ambulatory Visit (HOSPITAL_COMMUNITY): Payer: Managed Care, Other (non HMO) | Admitting: Anesthesiology

## 2016-09-28 ENCOUNTER — Encounter (HOSPITAL_COMMUNITY): Payer: Self-pay | Admitting: Certified Registered"

## 2016-09-28 ENCOUNTER — Encounter (HOSPITAL_COMMUNITY)
Admission: RE | Disposition: A | Discharge status: Home / Self Care | Payer: Self-pay | Source: Ambulatory Visit | Attending: Orthopedic Surgery

## 2016-09-28 ENCOUNTER — Observation Stay (HOSPITAL_COMMUNITY)
Admission: RE | Admit: 2016-09-28 | Discharge: 2016-09-29 | Disposition: A | Discharge status: Home / Self Care | Payer: Managed Care, Other (non HMO) | Source: Ambulatory Visit | Attending: Orthopedic Surgery | Admitting: Orthopedic Surgery

## 2016-09-28 DIAGNOSIS — Z79899 Other long term (current) drug therapy: Secondary | ICD-10-CM | POA: Insufficient documentation

## 2016-09-28 DIAGNOSIS — Z96659 Presence of unspecified artificial knee joint: Secondary | ICD-10-CM

## 2016-09-28 DIAGNOSIS — Z8249 Family history of ischemic heart disease and other diseases of the circulatory system: Secondary | ICD-10-CM | POA: Diagnosis not present

## 2016-09-28 DIAGNOSIS — I10 Essential (primary) hypertension: Secondary | ICD-10-CM | POA: Insufficient documentation

## 2016-09-28 DIAGNOSIS — Z791 Long term (current) use of non-steroidal anti-inflammatories (NSAID): Secondary | ICD-10-CM | POA: Diagnosis not present

## 2016-09-28 DIAGNOSIS — K219 Gastro-esophageal reflux disease without esophagitis: Secondary | ICD-10-CM | POA: Insufficient documentation

## 2016-09-28 DIAGNOSIS — Z833 Family history of diabetes mellitus: Secondary | ICD-10-CM | POA: Diagnosis not present

## 2016-09-28 DIAGNOSIS — Z818 Family history of other mental and behavioral disorders: Secondary | ICD-10-CM | POA: Insufficient documentation

## 2016-09-28 DIAGNOSIS — M1712 Unilateral primary osteoarthritis, left knee: Principal | ICD-10-CM | POA: Insufficient documentation

## 2016-09-28 HISTORY — PX: TOTAL KNEE ARTHROPLASTY: SHX125

## 2016-09-28 SURGERY — ARTHROPLASTY, KNEE, TOTAL
Anesthesia: Spinal | Site: Knee | Laterality: Left

## 2016-09-28 MED ORDER — PROPOFOL 500 MG/50ML IV EMUL
INTRAVENOUS | Status: DC | PRN
  Administered 2016-09-28: 75 ug/kg/min via INTRAVENOUS

## 2016-09-28 MED ORDER — FENTANYL CITRATE (PF) 100 MCG/2ML IJ SOLN
INTRAMUSCULAR | Status: AC
  Administered 2016-09-28: 100 ug
  Filled 2016-09-28: qty 2

## 2016-09-28 MED ORDER — CELECOXIB 200 MG PO CAPS
200.0000 mg | ORAL_CAPSULE | Freq: Two times a day (BID) | ORAL | Status: DC
  Administered 2016-09-28 – 2016-09-29 (×2): 200 mg via ORAL
  Filled 2016-09-28 (×3): qty 1

## 2016-09-28 MED ORDER — HYDROCODONE-ACETAMINOPHEN 7.5-325 MG PO TABS
1.0000 | ORAL_TABLET | Freq: Four times a day (QID) | ORAL | Status: DC
  Administered 2016-09-28 – 2016-09-29 (×5): 1 via ORAL
  Filled 2016-09-28 (×5): qty 1

## 2016-09-28 MED ORDER — MIDAZOLAM HCL 5 MG/5ML IJ SOLN
INTRAMUSCULAR | Status: DC | PRN
  Administered 2016-09-28: 1 mg via INTRAVENOUS

## 2016-09-28 MED ORDER — SENNOSIDES-DOCUSATE SODIUM 8.6-50 MG PO TABS: 1.0000 | ORAL_TABLET | Freq: Every evening | ORAL | Status: DC | PRN

## 2016-09-28 MED ORDER — METHOCARBAMOL 500 MG PO TABS
500.0000 mg | ORAL_TABLET | Freq: Four times a day (QID) | ORAL | Status: DC | PRN
  Administered 2016-09-28 – 2016-09-29 (×4): 500 mg via ORAL
  Filled 2016-09-28 (×4): qty 1

## 2016-09-28 MED ORDER — 0.9 % SODIUM CHLORIDE (POUR BTL) OPTIME
TOPICAL | Status: DC | PRN
  Administered 2016-09-28: 1000 mL

## 2016-09-28 MED ORDER — LACTATED RINGERS IV SOLN
INTRAVENOUS | Status: DC
  Administered 2016-09-28 (×2): via INTRAVENOUS
  Administered 2016-09-28: 50 mL/h via INTRAVENOUS

## 2016-09-28 MED ORDER — CEFAZOLIN SODIUM-DEXTROSE 1-4 GM/50ML-% IV SOLN
1.0000 g | Freq: Four times a day (QID) | INTRAVENOUS | Status: AC
  Administered 2016-09-28 (×2): 1 g via INTRAVENOUS
  Filled 2016-09-28 (×2): qty 50

## 2016-09-28 MED ORDER — MIDAZOLAM HCL 2 MG/2ML IJ SOLN
INTRAMUSCULAR | Status: AC
  Administered 2016-09-28: 1 mg
  Filled 2016-09-28: qty 2

## 2016-09-28 MED ORDER — ONDANSETRON HCL 4 MG/2ML IJ SOLN
INTRAMUSCULAR | Status: AC
  Filled 2016-09-28: qty 2

## 2016-09-28 MED ORDER — BUPIVACAINE-EPINEPHRINE (PF) 0.25% -1:200000 IJ SOLN
INTRAMUSCULAR | Status: DC | PRN
  Administered 2016-09-28: 30 mL via PERINEURAL

## 2016-09-28 MED ORDER — FENTANYL CITRATE (PF) 250 MCG/5ML IJ SOLN
INTRAMUSCULAR | Status: AC
  Filled 2016-09-28: qty 5

## 2016-09-28 MED ORDER — HYDROMORPHONE HCL 1 MG/ML IJ SOLN
INTRAMUSCULAR | Status: AC
  Filled 2016-09-28: qty 1

## 2016-09-28 MED ORDER — HYDROMORPHONE HCL 1 MG/ML IJ SOLN
1.0000 mg | INTRAMUSCULAR | Status: DC | PRN
  Administered 2016-09-28: 1 mg via INTRAVENOUS
  Filled 2016-09-28: qty 1

## 2016-09-28 MED ORDER — ZOLPIDEM TARTRATE 5 MG PO TABS: 5.0000 mg | ORAL_TABLET | Freq: Every evening | ORAL | Status: DC | PRN

## 2016-09-28 MED ORDER — PHENOL 1.4 % MT LIQD: 1.0000 | OROMUCOSAL | Status: DC | PRN

## 2016-09-28 MED ORDER — DIPHENHYDRAMINE HCL 12.5 MG/5ML PO ELIX: 12.5000 mg | ORAL_SOLUTION | ORAL | Status: DC | PRN

## 2016-09-28 MED ORDER — HYDROMORPHONE HCL 1 MG/ML IJ SOLN
INTRAMUSCULAR | Status: AC
  Filled 2016-09-28: qty 0.5

## 2016-09-28 MED ORDER — SODIUM CHLORIDE 0.9 % IJ SOLN
INTRAMUSCULAR | Status: DC | PRN
  Administered 2016-09-28: 20 mL

## 2016-09-28 MED ORDER — ASPIRIN EC 325 MG PO TBEC
325.0000 mg | DELAYED_RELEASE_TABLET | Freq: Two times a day (BID) | ORAL | Status: DC
  Administered 2016-09-28 – 2016-09-29 (×3): 325 mg via ORAL
  Filled 2016-09-28 (×3): qty 1

## 2016-09-28 MED ORDER — METHOCARBAMOL 1000 MG/10ML IJ SOLN
500.0000 mg | Freq: Four times a day (QID) | INTRAVENOUS | Status: DC | PRN
  Filled 2016-09-28: qty 5

## 2016-09-28 MED ORDER — ONDANSETRON HCL 4 MG/2ML IJ SOLN
4.0000 mg | Freq: Four times a day (QID) | INTRAMUSCULAR | Status: DC | PRN
  Administered 2016-09-28 – 2016-09-29 (×2): 4 mg via INTRAVENOUS
  Filled 2016-09-28 (×2): qty 2

## 2016-09-28 MED ORDER — BUPIVACAINE-EPINEPHRINE 0.25% -1:200000 IJ SOLN
INTRAMUSCULAR | Status: AC
  Filled 2016-09-28: qty 1

## 2016-09-28 MED ORDER — DOCUSATE SODIUM 100 MG PO CAPS
100.0000 mg | ORAL_CAPSULE | Freq: Two times a day (BID) | ORAL | Status: DC
  Administered 2016-09-28 – 2016-09-29 (×3): 100 mg via ORAL
  Filled 2016-09-28 (×3): qty 1

## 2016-09-28 MED ORDER — PROPOFOL 10 MG/ML IV BOLUS
INTRAVENOUS | Status: AC
  Filled 2016-09-28: qty 20

## 2016-09-28 MED ORDER — HYDROCHLOROTHIAZIDE 25 MG PO TABS: 25.0000 mg | ORAL_TABLET | Freq: Every day | ORAL | Status: DC

## 2016-09-28 MED ORDER — ONDANSETRON HCL 4 MG/2ML IJ SOLN
INTRAMUSCULAR | Status: DC | PRN
  Administered 2016-09-28: 4 mg via INTRAVENOUS

## 2016-09-28 MED ORDER — GABAPENTIN 300 MG PO CAPS
300.0000 mg | ORAL_CAPSULE | Freq: Three times a day (TID) | ORAL | Status: DC
  Administered 2016-09-28 – 2016-09-29 (×3): 300 mg via ORAL
  Filled 2016-09-28 (×3): qty 1

## 2016-09-28 MED ORDER — CHLORHEXIDINE GLUCONATE 4 % EX LIQD: 60.0000 mL | Freq: Once | CUTANEOUS | Status: DC

## 2016-09-28 MED ORDER — IRBESARTAN 300 MG PO TABS: 300.0000 mg | ORAL_TABLET | Freq: Every day | ORAL | Status: DC

## 2016-09-28 MED ORDER — DEXAMETHASONE SODIUM PHOSPHATE 10 MG/ML IJ SOLN
10.0000 mg | Freq: Once | INTRAMUSCULAR | Status: AC
  Administered 2016-09-29: 10 mg via INTRAVENOUS
  Filled 2016-09-28: qty 1

## 2016-09-28 MED ORDER — HYDROMORPHONE HCL 1 MG/ML IJ SOLN
0.2500 mg | INTRAMUSCULAR | Status: DC | PRN
  Administered 2016-09-28 (×4): 0.5 mg via INTRAVENOUS

## 2016-09-28 MED ORDER — FLEET ENEMA 7-19 GM/118ML RE ENEM: 1.0000 | ENEMA | Freq: Once | RECTAL | Status: DC | PRN

## 2016-09-28 MED ORDER — IRBESARTAN 300 MG PO TABS
300.0000 mg | ORAL_TABLET | Freq: Every day | ORAL | Status: DC
  Administered 2016-09-28: 300 mg via ORAL
  Filled 2016-09-28: qty 1

## 2016-09-28 MED ORDER — HYDROCHLOROTHIAZIDE 25 MG PO TABS
25.0000 mg | ORAL_TABLET | Freq: Every day | ORAL | Status: DC
  Administered 2016-09-28: 25 mg via ORAL
  Filled 2016-09-28: qty 1

## 2016-09-28 MED ORDER — TRANEXAMIC ACID 1000 MG/10ML IV SOLN
1000.0000 mg | Freq: Once | INTRAVENOUS | Status: AC
  Administered 2016-09-28: 1000 mg via INTRAVENOUS
  Filled 2016-09-28: qty 10

## 2016-09-28 MED ORDER — SODIUM CHLORIDE 0.9 % IR SOLN
Status: DC | PRN
  Administered 2016-09-28: 3000 mL

## 2016-09-28 MED ORDER — METOCLOPRAMIDE HCL 5 MG/ML IJ SOLN
5.0000 mg | Freq: Three times a day (TID) | INTRAMUSCULAR | Status: DC | PRN
  Administered 2016-09-29: 10 mg via INTRAVENOUS
  Filled 2016-09-28: qty 2

## 2016-09-28 MED ORDER — ACETAMINOPHEN 650 MG RE SUPP: 650.0000 mg | Freq: Four times a day (QID) | RECTAL | Status: DC | PRN

## 2016-09-28 MED ORDER — OXYCODONE HCL 5 MG PO TABS
5.0000 mg | ORAL_TABLET | Freq: Once | ORAL | Status: AC | PRN
  Administered 2016-09-28: 5 mg via ORAL

## 2016-09-28 MED ORDER — OXYCODONE HCL 5 MG PO TABS
ORAL_TABLET | ORAL | Status: AC
  Filled 2016-09-28: qty 1

## 2016-09-28 MED ORDER — FENTANYL CITRATE (PF) 100 MCG/2ML IJ SOLN
INTRAMUSCULAR | Status: DC | PRN
  Administered 2016-09-28 (×2): 50 ug via INTRAVENOUS

## 2016-09-28 MED ORDER — BISACODYL 5 MG PO TBEC: 5.0000 mg | DELAYED_RELEASE_TABLET | Freq: Every day | ORAL | Status: DC | PRN

## 2016-09-28 MED ORDER — METOCLOPRAMIDE HCL 5 MG PO TABS: 5.0000 mg | ORAL_TABLET | Freq: Three times a day (TID) | ORAL | Status: DC | PRN

## 2016-09-28 MED ORDER — ONDANSETRON HCL 4 MG PO TABS: 4.0000 mg | ORAL_TABLET | Freq: Four times a day (QID) | ORAL | Status: DC | PRN

## 2016-09-28 MED ORDER — MENTHOL 3 MG MT LOZG: 1.0000 | LOZENGE | OROMUCOSAL | Status: DC | PRN

## 2016-09-28 MED ORDER — ACETAMINOPHEN 325 MG PO TABS: 650.0000 mg | ORAL_TABLET | Freq: Four times a day (QID) | ORAL | Status: DC | PRN

## 2016-09-28 MED ORDER — OXYBUTYNIN CHLORIDE ER 10 MG PO TB24
10.0000 mg | ORAL_TABLET | Freq: Every day | ORAL | Status: DC
  Administered 2016-09-28: 10 mg via ORAL
  Filled 2016-09-28: qty 1

## 2016-09-28 MED ORDER — OXYCODONE HCL 5 MG/5ML PO SOLN: 5.0000 mg | Freq: Once | ORAL | Status: AC | PRN

## 2016-09-28 MED ORDER — MIDAZOLAM HCL 2 MG/2ML IJ SOLN
INTRAMUSCULAR | Status: AC
  Filled 2016-09-28: qty 2

## 2016-09-28 MED ORDER — TELMISARTAN-HCTZ 80-25 MG PO TABS: 1.0000 | ORAL_TABLET | Freq: Every evening | ORAL | Status: DC

## 2016-09-28 MED ORDER — OXYCODONE HCL 5 MG PO TABS
5.0000 mg | ORAL_TABLET | ORAL | Status: DC | PRN
  Administered 2016-09-28 – 2016-09-29 (×3): 10 mg via ORAL
  Filled 2016-09-28 (×3): qty 2

## 2016-09-28 MED ORDER — ALUM & MAG HYDROXIDE-SIMETH 200-200-20 MG/5ML PO SUSP: 30.0000 mL | ORAL | Status: DC | PRN

## 2016-09-28 MED ORDER — ROPIVACAINE HCL 5 MG/ML IJ SOLN
INTRAMUSCULAR | Status: DC | PRN
  Administered 2016-09-28: 25 mL via PERINEURAL

## 2016-09-28 SURGICAL SUPPLY — 57 items
BANDAGE ACE 6X5 VEL STRL LF (GAUZE/BANDAGES/DRESSINGS) ×2 IMPLANT
BANDAGE ELASTIC 6 VELCRO ST LF (GAUZE/BANDAGES/DRESSINGS) ×1 IMPLANT
BANDAGE ESMARK 6X9 LF (GAUZE/BANDAGES/DRESSINGS) ×1 IMPLANT
BLADE SAGITTAL 13X1.27X60 (BLADE) ×2 IMPLANT
BLADE SAW SGTL 83.5X18.5 (BLADE) ×2 IMPLANT
BLADE SURG 10 STRL SS (BLADE) ×2 IMPLANT
BNDG CMPR 9X6 STRL LF SNTH (GAUZE/BANDAGES/DRESSINGS) ×1
BNDG ESMARK 6X9 LF (GAUZE/BANDAGES/DRESSINGS) ×2
BOWL SMART MIX CTS (DISPOSABLE) ×2 IMPLANT
CAPT KNEE TOTAL 3 ×2 IMPLANT
CEMENT BONE SIMPLEX SPEEDSET (Cement) ×4 IMPLANT
COVER SURGICAL LIGHT HANDLE (MISCELLANEOUS) ×2 IMPLANT
CUFF TOURNIQUET SINGLE 34IN LL (TOURNIQUET CUFF) ×2 IMPLANT
DRAPE EXTREMITY T 121X128X90 (DRAPE) ×2 IMPLANT
DRAPE HALF SHEET 40X57 (DRAPES) ×2 IMPLANT
DRAPE INCISE IOBAN 66X45 STRL (DRAPES) ×4 IMPLANT
DRAPE U-SHAPE 47X51 STRL (DRAPES) ×2 IMPLANT
DRSG AQUACEL AG ADV 3.5X10 (GAUZE/BANDAGES/DRESSINGS) ×2 IMPLANT
DURAPREP 26ML APPLICATOR (WOUND CARE) ×4 IMPLANT
ELECT REM PT RETURN 9FT ADLT (ELECTROSURGICAL) ×2
ELECTRODE REM PT RTRN 9FT ADLT (ELECTROSURGICAL) ×1 IMPLANT
GLOVE BIOGEL M 7.0 STRL (GLOVE) ×1 IMPLANT
GLOVE BIOGEL PI IND STRL 7.5 (GLOVE) IMPLANT
GLOVE BIOGEL PI IND STRL 8.5 (GLOVE) ×1 IMPLANT
GLOVE BIOGEL PI INDICATOR 7.5 (GLOVE) ×1
GLOVE BIOGEL PI INDICATOR 8.5 (GLOVE) ×1
GLOVE SURG ORTHO 8.0 STRL STRW (GLOVE) ×4 IMPLANT
GOWN STRL REUS W/ TWL LRG LVL3 (GOWN DISPOSABLE) ×1 IMPLANT
GOWN STRL REUS W/ TWL XL LVL3 (GOWN DISPOSABLE) ×2 IMPLANT
GOWN STRL REUS W/TWL LRG LVL3 (GOWN DISPOSABLE) ×2
GOWN STRL REUS W/TWL XL LVL3 (GOWN DISPOSABLE) ×4
HANDPIECE INTERPULSE COAX TIP (DISPOSABLE) ×2
HOOD PEEL AWAY FACE SHEILD DIS (HOOD) ×6 IMPLANT
KIT BASIN OR (CUSTOM PROCEDURE TRAY) ×2 IMPLANT
KIT ROOM TURNOVER OR (KITS) ×2 IMPLANT
KNEE CAPITATED TOTAL 3 IMPLANT
MANIFOLD NEPTUNE II (INSTRUMENTS) ×2 IMPLANT
NEEDLE 22X1 1/2 (OR ONLY) (NEEDLE) ×4 IMPLANT
NS IRRIG 1000ML POUR BTL (IV SOLUTION) ×2 IMPLANT
PACK TOTAL JOINT (CUSTOM PROCEDURE TRAY) ×2 IMPLANT
PAD ARMBOARD 7.5X6 YLW CONV (MISCELLANEOUS) ×4 IMPLANT
SET HNDPC FAN SPRY TIP SCT (DISPOSABLE) ×1 IMPLANT
STRIP CLOSURE SKIN 1/2X4 (GAUZE/BANDAGES/DRESSINGS) ×2 IMPLANT
SUCTION FRAZIER HANDLE 10FR (MISCELLANEOUS) ×1
SUCTION TUBE FRAZIER 10FR DISP (MISCELLANEOUS) IMPLANT
SUT BONE WAX W31G (SUTURE) ×1 IMPLANT
SUT MNCRL AB 3-0 PS2 18 (SUTURE) ×2 IMPLANT
SUT VIC AB 0 CTB1 27 (SUTURE) ×4 IMPLANT
SUT VIC AB 1 CT1 27 (SUTURE) ×4
SUT VIC AB 1 CT1 27XBRD ANBCTR (SUTURE) ×2 IMPLANT
SUT VIC AB 2-0 CT1 27 (SUTURE) ×4
SUT VIC AB 2-0 CT1 TAPERPNT 27 (SUTURE) ×2 IMPLANT
SYR 20CC LL (SYRINGE) ×4 IMPLANT
TOWEL OR 17X24 6PK STRL BLUE (TOWEL DISPOSABLE) ×2 IMPLANT
TOWEL OR 17X26 10 PK STRL BLUE (TOWEL DISPOSABLE) ×2 IMPLANT
TRAY CATH 16FR W/PLASTIC CATH (SET/KITS/TRAYS/PACK) ×1 IMPLANT
WRAP KNEE MAXI GEL POST OP (GAUZE/BANDAGES/DRESSINGS) ×2 IMPLANT

## 2016-09-28 NOTE — Transfer of Care (Signed)
Immediate Anesthesia Transfer of Care Note  Patient: Audrey Farley  Procedure(s) Performed: Procedure(s): LEFT TOTAL KNEE ARTHROPLASTY (Left)  Patient Location: PACU  Anesthesia Type:MAC and Spinal  Level of Consciousness: awake, alert  and oriented  Airway & Oxygen Therapy: Patient Spontanous Breathing  Post-op Assessment: Report given to RN  Post vital signs: Reviewed and stable  Last Vitals:  Vitals:   09/28/16 0810 09/28/16 0815  BP: 136/74 131/74  Pulse: 61 (!) 56  Resp: 16 19  Temp:      Last Pain:  Vitals:   09/28/16 0815  TempSrc:   PainSc: 0-No pain      Patients Stated Pain Goal: 4 (39/03/00 9233)  Complications: No apparent anesthesia complications

## 2016-09-28 NOTE — Evaluation (Signed)
Physical Therapy Evaluation Patient Details Name: Audrey Farley MRN: [id number] DOB: June 15, 1960 Today's Date: 09/28/2016   History of Present Illness  Pt is a 56 y/o female s/p elective L TKA. PMH includes Aneurysm of ascending aorta, and L knee arthroscopy.   Clinical Impression  Pt is s/p surgery above with deficits below. PTA, pt was independent with functional mobility. Upon eval, pt limited by post op pain and weakness, decreased balance, and lightheadedness. Pt only able to tolerate short distance secondary to lightheadedness and increased pain. Required from min to mod A for mobility this session. Pt reports she will have necessary assist at home, and has all DME. Follow up PT per MD arrangements. Will continue to follow acutely to maximize functional mobility independence.     Follow Up Recommendations DC plan and follow up therapy as arranged by surgeon;Supervision/Assistance - 24 hour    Equipment Recommendations  None recommended by PT (HAs DME at home )    Recommendations for Other Services       Precautions / Restrictions Precautions Precautions: Knee Precaution Booklet Issued: Yes (comment) Precaution Comments: Reviewed supine ther ex. Limited tolerance secondary to pain.  Restrictions Weight Bearing Restrictions: Yes LLE Weight Bearing: Weight bearing as tolerated      Mobility  Bed Mobility Overal bed mobility: Needs Assistance Bed Mobility: Supine to Sit     Supine to sit: Mod assist     General bed mobility comments: Mod A for LLE management. Use of elevated HOB and bed rails.   Transfers Overall transfer level: Needs assistance Equipment used: Rolling walker (2 wheeled) Transfers: Sit to/from Stand Sit to Stand: Min assist         General transfer comment: Min A for lift assist and steadying upon standing.   Ambulation/Gait Ambulation/Gait assistance: Min assist Ambulation Distance (Feet): 10 Feet Assistive device: Rolling walker (2  wheeled) Gait Pattern/deviations: Step-to pattern;Decreased step length - right;Decreased step length - left;Decreased weight shift to left;Antalgic Gait velocity: Decreased Gait velocity interpretation: Below normal speed for age/gender General Gait Details: Slow, antalgic gait secondary to post op pain and weakness. Limited tolerance secondary to increased pain and lightheadedness. Verbal cues for sequencing with use of RW.   Stairs            Wheelchair Mobility    Modified Rankin (Stroke Patients Only)       Balance Overall balance assessment: Needs assistance Sitting-balance support: No upper extremity supported;Feet supported Sitting balance-Leahy Scale: Fair     Standing balance support: Bilateral upper extremity supported;During functional activity Standing balance-Leahy Scale: Poor Standing balance comment: Reliant on RW for stability                              Pertinent Vitals/Pain Pain Assessment: 0-10 Pain Score: 10-Worst pain ever Pain Location: L Knee Pain Descriptors / Indicators: Aching;Operative site guarding;Sore Pain Intervention(s): Limited activity within patient's tolerance;Monitored during session;Repositioned    Home Living Family/patient expects to be discharged to:: Private residence Living Arrangements: Children Available Help at Discharge: Family;Available 24 hours/day Type of Home: House Home Access: Stairs to enter Entrance Stairs-Rails: None Entrance Stairs-Number of Steps: 3 Home Layout: Other (Comment) (go down 2 steps to [employer] ) Home Equipment: Gilford Rile - 2 wheels;Bedside commode      Prior Function Level of Independence: Independent               Hand Dominance   Dominant Hand: Right  Extremity/Trunk Assessment   Upper Extremity Assessment Upper Extremity Assessment: Defer to OT evaluation    Lower Extremity Assessment Lower Extremity Assessment: LLE deficits/detail LLE Deficits / Details: Numbness  from foot to knee; able to feel some. Deficits consistent with post op pain and weakness. Able to perform exercises below.     Cervical / Trunk Assessment Cervical / Trunk Assessment: Kyphotic  Communication   Communication: No difficulties  Cognition Arousal/Alertness: Suspect due to medications (very sleepy ) Behavior During Therapy: WFL for tasks assessed/performed Overall Cognitive Status: Within Functional Limits for tasks assessed                                        General Comments General comments (skin integrity, edema, etc.): Pt very limited this session secondary to sleepiness, pain, and light headedness.     Exercises Total Joint Exercises Ankle Circles/Pumps: AROM;Both;10 reps;Supine Quad Sets: AROM;Left;10 reps;Supine Towel Squeeze: AROM;Both;10 reps;Supine Heel Slides: AAROM;Left;10 reps;Supine Hip ABduction/ADduction: AROM;Left;10 reps;Supine   Assessment/Plan    PT Assessment Patient needs continued PT services  PT Problem List Decreased strength;Decreased range of motion;Decreased activity tolerance;Decreased balance;Decreased mobility;Decreased knowledge of use of DME;Decreased knowledge of precautions;Pain       PT Treatment Interventions DME instruction;Gait training;Stair training;Functional mobility training;Therapeutic activities;Therapeutic exercise;Balance training;Neuromuscular re-education;Patient/family education    PT Goals (Current goals can be found in the Care Plan section)  Acute Rehab PT Goals Patient Stated Goal: to decrease pain  PT Goal Formulation: With patient Time For Goal Achievement: 10/05/16 Potential to Achieve Goals: Good    Frequency 7X/week   Barriers to discharge        Co-evaluation               AM-PAC PT "6 Clicks" Daily Activity  Outcome Measure Difficulty turning over in bed (including adjusting bedclothes, sheets and blankets)?: A Lot Difficulty moving from lying on back to sitting on  the side of the bed? : Total Difficulty sitting down on and standing up from a chair with arms (e.g., wheelchair, bedside commode, etc,.)?: Total Help needed moving to and from a bed to chair (including a wheelchair)?: A Little Help needed walking in hospital room?: A Little Help needed climbing 3-5 steps with a railing? : A Lot 6 Click Score: 12    End of Session Equipment Utilized During Treatment: Gait belt Activity Tolerance: Patient limited by lethargy;Treatment limited secondary to medical complications (Comment) (lightheadedness ) Patient left: in chair;with call bell/phone within reach;with family/visitor present Nurse Communication: Mobility status PT Visit Diagnosis: Other abnormalities of gait and mobility (R26.89);Pain Pain - Right/Left: Left Pain - part of body: Knee    Time: 2836-6294 PT Time Calculation (min) (ACUTE ONLY): 23 min   Charges:   PT Evaluation $PT Eval Low Complexity: 1 Procedure PT Treatments $Therapeutic Exercise: 8-22 mins   PT G Codes:   PT G-Codes **NOT FOR INPATIENT CLASS** Functional Assessment Tool Used: AM-PAC 6 Clicks Basic Mobility;Clinical judgement Functional Limitation: Mobility: Walking and moving around Mobility: Walking and Moving Around Current Status (T6546): At least 60 percent but less than 80 percent impaired, limited or restricted Mobility: Walking and Moving Around Goal Status [phone]): At least 1 percent but less than 20 percent impaired, limited or restricted    Leighton Ruff, PT, DPT  Acute Rehabilitation Services  Pager: [phone] 09/28/2016, 5:13 PM

## 2016-09-28 NOTE — Anesthesia Procedure Notes (Signed)
Procedure Name: MAC Date/Time: 09/28/2016 8:47 AM Performed by: Barrington Ellison Pre-anesthesia Checklist: Patient identified, Emergency Drugs available, Suction available and Patient being monitored Patient Re-evaluated:Patient Re-evaluated prior to induction Oxygen Delivery Method: Simple face mask

## 2016-09-28 NOTE — Progress Notes (Signed)
Orthopedic Tech Progress Note Patient Details:  Audrey Farley Jul 30, 1960 [id number]  CPM Left Knee CPM Left Knee: On Left Knee Flexion (Degrees): 90 Left Knee Extension (Degrees): 0 Additional Comments: foot roll   [place] Pink 09/28/2016, 11:59 AM

## 2016-09-28 NOTE — Anesthesia Postprocedure Evaluation (Signed)
Anesthesia Post Note  Patient: Audrey Farley  Procedure(s) Performed: Procedure(s) (LRB): LEFT TOTAL KNEE ARTHROPLASTY (Left)     Patient location during evaluation: PACU Anesthesia Type: Spinal Level of consciousness: oriented and awake and alert Pain management: pain level not controlled Vital Signs Assessment: post-procedure vital signs reviewed and stable Respiratory status: spontaneous breathing, respiratory function stable and patient connected to nasal cannula oxygen Cardiovascular status: blood pressure returned to baseline and stable Postop Assessment: no headache and no backache Anesthetic complications: no    Last Vitals:  Vitals:   09/28/16 1227 09/28/16 1242  BP:  (!) 142/73  Pulse:  (!) 54  Resp:  18  Temp: (!) 36.3 C (!) 36.3 C    Last Pain:  Vitals:   09/28/16 1326  TempSrc:   PainSc: 10-Worst pain ever                 Casmira Cramer,JAMES TERRILL

## 2016-09-28 NOTE — Progress Notes (Signed)
Orthopedic Tech Progress Note Patient Details:  Audrey Farley 1960/11/29 [id number]  CPM Left Knee CPM Left Knee: On Left Knee Flexion (Degrees): 60 Left Knee Extension (Degrees): 0 Additional Comments: Left knee ON   Kristopher Oppenheim 09/28/2016, 11:21 PM

## 2016-09-28 NOTE — H&P (Signed)
Audrey Farley MRN:  [id number] DOB/SEX:  December 08, 1960/female  CHIEF COMPLAINT:  Painful left Knee  HISTORY: Patient is a 56 y.o. female presented with a history of pain in the left knee. Onset of symptoms was gradual starting a few years ago with gradually worsening course since that time. Patient has been treated conservatively with over-the-counter NSAIDs and activity modification. Patient currently rates pain in the knee at 10 out of 10 with activity. There is pain at night.  PAST MEDICAL HISTORY: Patient Active Problem List   Diagnosis Date Noted  . Primary osteoarthritis of left knee 08/19/2016  . Vitamin D deficiency 08/08/2016  . Aneurysm of ascending aorta (HCC) 02/25/2016  . GERD (gastroesophageal reflux disease)   . Allergic rhinitis   . Allergy   . Urge incontinence   . HTN (hypertension) 04/15/2012   Past Medical History:  Diagnosis Date  . Allergy   . Aneurysm of ascending aorta ([place]) 02/25/2016   3.9cm by echo 02/2016  -aorta enlarged  . Arthritis   . Chest pain 01/24/2016  . Dyspnea on exertion   . Hypertension   . Urge incontinence    Past Surgical History:  Procedure Laterality Date  . ABDOMINAL HYSTERECTOMY     still has cervix; DUB; ovaries intact.  Marland Kitchen KNEE ARTHROSCOPY Left 2012     MEDICATIONS:   Prescriptions Prior to Admission  Medication Sig Dispense Refill Last Dose  . ibuprofen (ADVIL,MOTRIN) 200 MG tablet Take 400-800 mg by mouth every 6 (six) hours as needed for mild pain or moderate pain. Depends on pain if takes 2-4 tablets     . oxybutynin (DITROPAN-XL) 10 MG 24 hr tablet Take 1 tablet (10 mg total) by mouth at bedtime. 90 tablet 3 Taking  . telmisartan-hydrochlorothiazide (MICARDIS HCT) 80-25 MG tablet Take 1 tablet by mouth every evening.       ALLERGIES:   Allergies  Allergen Reactions  . No Known Allergies     REVIEW OF SYSTEMS:  A comprehensive review of systems was negative except for: Musculoskeletal: positive for arthralgias  and bone pain   FAMILY HISTORY:   Family History  Problem Relation Age of Onset  . Diabetes Mother   . Hypertension Mother   . Heart attack Mother   . Hyperlipidemia Mother   . Heart disease Mother        heart problems; no AMI  . Kidney disease Mother   . Hyperlipidemia Sister   . Hypertension Sister   . Multiple sclerosis Sister   . Mental illness Brother   . Epilepsy Brother   . Diabetes Sister   . Sudden death Neg Hx     SOCIAL HISTORY:   Social History  Substance Use Topics  . Smoking status: Never Smoker  . Smokeless tobacco: Never Used  . Alcohol use Yes     Comment: occ vodka     EXAMINATION:  Vital signs in last 24 hours:    There were no vitals taken for this visit.  General Appearance:    Alert, cooperative, no distress, appears stated age  Head:    Normocephalic, without obvious abnormality, atraumatic  Eyes:    PERRL, conjunctiva/corneas clear, EOM's intact, fundi    benign, both eyes  Ears:    Normal TM's and external ear canals, both ears  Nose:   Nares normal, septum midline, mucosa normal, no drainage    or sinus tenderness  Throat:   Lips, mucosa, and tongue normal; teeth and gums normal  Neck:  Supple, symmetrical, trachea midline, no adenopathy;    thyroid:  no enlargement/tenderness/nodules; no carotid   bruit or JVD  Back:     Symmetric, no curvature, ROM normal, no CVA tenderness  Lungs:     Clear to auscultation bilaterally, respirations unlabored  Chest Wall:    No tenderness or deformity   Heart:    Regular rate and rhythm, S1 and S2 normal, no murmur, rub   or gallop  Breast Exam:    No tenderness, masses, or nipple abnormality  Abdomen:     Soft, non-tender, bowel sounds active all four quadrants,    no masses, no organomegaly  Genitalia:    Normal female without lesion, discharge or tenderness  Rectal:    Normal tone, no masses or tenderness;   guaiac negative stool  Extremities:   Extremities normal, atraumatic, no cyanosis or  edema  Pulses:   2+ and symmetric all extremities  Skin:   Skin color, texture, turgor normal, no rashes or lesions  Lymph nodes:   Cervical, supraclavicular, and axillary nodes normal  Neurologic:   CNII-XII intact, normal strength, sensation and reflexes    throughout    Musculoskeletal:  ROM 0-120, Ligaments intact,  Imaging Review Plain radiographs demonstrate severe degenerative joint disease of the left knee. The overall alignment is neutral. The bone quality appears to be good for age and reported activity level.  Assessment/Plan: Primary osteoarthritis, left knee   The patient history, physical examination and imaging studies are consistent with advanced degenerative joint disease of the left knee. The patient has failed conservative treatment.  The clearance notes were reviewed.  After discussion with the patient it was felt that Total Knee Replacement was indicated. The procedure,  risks, and benefits of total knee arthroplasty were presented and reviewed. The risks including but not limited to aseptic loosening, infection, blood clots, vascular injury, stiffness, patella tracking problems complications among others were discussed. The patient acknowledged the explanation, agreed to proceed with the plan.  Donia Ast 09/28/2016, 6:27 AM

## 2016-09-28 NOTE — Anesthesia Procedure Notes (Addendum)
Anesthesia Regional Block: Adductor canal block   Pre-Anesthetic Checklist: ,, timeout performed, Correct Patient, Correct Site, Correct Laterality, Correct Procedure, Correct Position, site marked, Risks and benefits discussed,  Surgical consent,  Pre-op evaluation,  At surgeon's request and post-op pain management  Laterality: Left and Lower  Prep: chloraprep       Needles:   Needle Type: Echogenic Stimulator Needle     Needle Length: 9cm  Needle Gauge: 21   Needle insertion depth: 7 cm   Additional Needles:   Narrative:  Start time: 09/28/2016 7:55 AM End time: 09/28/2016 8:11 AM Injection made incrementally with aspirations every 5 mL.  Performed by: Personally  Anesthesiologist: Taci Sterling

## 2016-09-28 NOTE — Anesthesia Procedure Notes (Signed)
Spinal  Patient location during procedure: OR Start time: 09/28/2016 8:40 AM End time: 09/28/2016 8:46 AM Staffing Anesthesiologist: Rica Koyanagi Performed: anesthesiologist  Preanesthetic Checklist Completed: patient identified, site marked, surgical consent, pre-op evaluation, timeout performed, IV checked and monitors and equipment checked Spinal Block Patient position: sitting Prep: Betadine Patient monitoring: heart rate, cardiac monitor, continuous pulse ox and blood pressure Approach: midline Location: L3-4 Injection technique: single-shot Assessment Sensory level: T6 Additional Notes Tolerated well

## 2016-09-28 NOTE — Progress Notes (Signed)
Pt admitted to the unit from pacu; pt A&Ox4; MAE x4 but report some numbness and tingling to LLE due to anesthesia. Pt oriented to the unit and room; fall/safety precaution and prevention education completed; pt voices understanding and denies any questions. IV intact and transfusing; foley intact and unclamped; CPM on; call light within reach and warm blankets given to pt d/t temp a little low. VSS; and will closely monitor pt. Audrey Heady RN

## 2016-09-29 ENCOUNTER — Encounter (HOSPITAL_COMMUNITY): Payer: Self-pay | Admitting: Orthopedic Surgery

## 2016-09-29 DIAGNOSIS — M1712 Unilateral primary osteoarthritis, left knee: Secondary | ICD-10-CM | POA: Diagnosis not present

## 2016-09-29 LAB — CBC
HEMATOCRIT: 33.1 % — AB (ref 36.0–46.0)
HEMOGLOBIN: 11.3 g/dL — AB (ref 12.0–15.0)
MCH: 28.5 pg (ref 26.0–34.0)
MCHC: 34.1 g/dL (ref 30.0–36.0)
MCV: 83.6 fL (ref 78.0–100.0)
Platelets: 240 10*3/uL (ref 150–400)
RBC: 3.96 MIL/uL (ref 3.87–5.11)
RDW: 12.4 % (ref 11.5–15.5)
WBC: 11.7 10*3/uL — AB (ref 4.0–10.5)

## 2016-09-29 LAB — BASIC METABOLIC PANEL
ANION GAP: 11 (ref 5–15)
BUN: 17 mg/dL (ref 6–20)
CO2: 22 mmol/L (ref 22–32)
Calcium: 8.7 mg/dL — ABNORMAL LOW (ref 8.9–10.3)
Chloride: 100 mmol/L — ABNORMAL LOW (ref 101–111)
Creatinine, Ser: 1.05 mg/dL — ABNORMAL HIGH (ref 0.44–1.00)
GFR calc non Af Amer: 59 mL/min — ABNORMAL LOW (ref >60)
GLUCOSE: 155 mg/dL — AB (ref 65–99)
POTASSIUM: 3.5 mmol/L (ref 3.5–5.1)
Sodium: 133 mmol/L — ABNORMAL LOW (ref 135–145)

## 2016-09-29 MED ORDER — ONDANSETRON HCL 4 MG PO TABS: 4.0000 mg | ORAL_TABLET | Freq: Four times a day (QID) | ORAL | 0 refills | Status: DC | PRN

## 2016-09-29 MED ORDER — METHOCARBAMOL 500 MG PO TABS: 500.0000 mg | ORAL_TABLET | Freq: Four times a day (QID) | ORAL | 0 refills | Status: DC | PRN

## 2016-09-29 MED ORDER — OXYCODONE HCL 10 MG PO TABS: 10.0000 mg | ORAL_TABLET | ORAL | 0 refills | Status: DC | PRN

## 2016-09-29 MED ORDER — ASPIRIN 325 MG PO TBEC: 325.0000 mg | DELAYED_RELEASE_TABLET | Freq: Two times a day (BID) | ORAL | 0 refills | Status: DC

## 2016-09-29 NOTE — Care Management Note (Signed)
Case Management Note  Patient Details  Name: Audrey Farley MRN: [id number] Date of Birth: [birth year]  Subjective/Objective:   56 yr old female s/p left total knee arthroplasty.                 Action/Plan: Patient was preopratively setup with [facility], no changes. DME has beendelivered to her home.    Expected Discharge Date:   09/30/16               Expected Discharge Plan:  [place]  In-House Referral:  NA  Discharge planning Services  CM Consult  Post Acute Care Choice:  Home Health, Durable Medical Equipment Choice offered to:  Patient  DME Arranged:  CPM (has RW 3in1) DME Agency:  [employer]  HH Arranged:  PT [place] Agency:  [facility] (formerly [employer])  Status of Service:  Completed, signed off  If discussed at [employer], dates discussed:    Additional Comments:  Ninfa Meeker, RN 09/29/2016, 3:14 PM

## 2016-09-29 NOTE — Progress Notes (Signed)
Pt ready for discharge. Education/instructions reviewed with pt and daughters, and all questions/concerns addressed. IV removed and belongings gathered. Pt will be transported out via wheelchair to daughter's vehicle. Will continue to monitor

## 2016-09-29 NOTE — Progress Notes (Signed)
SPORTS MEDICINE AND JOINT REPLACEMENT  Lara Mulch, MD    Carlyon Shadow, PA-C [address], [place], [place]  [address]                             [phone]   PROGRESS NOTE  Subjective:  negative for Chest Pain  negative for Shortness of Breath  positive for Nausea/Vomiting   negative for Calf Pain  negative for Bowel Movement   Tolerating Diet: yes         Patient reports pain as 4 on 0-10 scale.    Objective: Vital signs in last 24 hours:   Patient Vitals for the past 24 hrs:  BP Temp Temp src Pulse Resp SpO2  09/29/16 0550 129/75 (!) 97.5 F (36.4 C) Oral (!) 57 16 99 %  09/29/16 0101 140/80 (!) 97.5 F (36.4 C) Oral 61 18 94 %  09/28/16 2101 (!) 156/76 97.8 F (36.6 C) Oral 61 18 96 %  09/28/16 1540 (!) 155/72 (!) 97.3 F (36.3 C) Tympanic 60 18 100 %  09/28/16 1242 (!) 142/73 (!) 97.3 F (36.3 C) Oral (!) 54 18 100 %  09/28/16 1227 - (!) 97.3 F (36.3 C) - - - -  09/28/16 1215 (!) 141/92 - - (!) 53 16 95 %  09/28/16 1200 (!) 128/95 - - (!) 56 19 100 %  09/28/16 1145 (!) 147/92 - - (!) 54 18 97 %  09/28/16 1130 (!) 156/87 - - (!) 56 18 94 %  09/28/16 1115 (!) 154/82 - - (!) 56 19 95 %  09/28/16 1100 (!) 152/89 97.6 F (36.4 C) - 62 20 100 %  09/28/16 0815 131/74 - - (!) 56 19 96 %  09/28/16 0810 136/74 - - 61 16 98 %    @flow {1959:LAST@   Intake/Output from previous day:   07/23 0701 - 07/24 0700 In: 2150 [P.O.:600; I.V.:1500] Out: 1250 [Urine:1200]   Intake/Output this shift:   No intake/output data recorded.   Intake/Output      07/23 0701 - 07/24 0700 07/24 0701 - 07/25 0700   P.O. 600    I.V. 1500    IV Piggyback 50    Total Intake 2150     Urine 1200    Blood 50    Total Output 1250     Net +900             LABORATORY DATA:  Recent Labs  09/29/16 0311  WBC 11.7*  HGB 11.3*  HCT 33.1*  PLT 240    Recent Labs  09/29/16 0311  NA 133*  K 3.5  CL 100*  CO2 22  BUN 17  CREATININE 1.05*  GLUCOSE 155*  CALCIUM 8.7*    Lab Results  Component Value Date   INR 0.98 01/17/2013    Examination:  General appearance: alert, cooperative and no distress Extremities: extremities normal, atraumatic, no cyanosis or edema  Wound Exam: clean, dry, intact   Drainage:  None: wound tissue dry  Motor Exam: Quadriceps and Hamstrings Intact  Sensory Exam: Superficial Peroneal, Deep Peroneal and Tibial normal   Assessment:    1 Day Post-Op  Procedure(s) (LRB): LEFT TOTAL KNEE ARTHROPLASTY (Left)  ADDITIONAL DIAGNOSIS:  Active Problems:   S/P total knee replacement     Plan: Physical Therapy as ordered Weight Bearing as Tolerated (WBAT)  DVT Prophylaxis:  Aspirin  DISCHARGE PLAN: Home  DISCHARGE NEEDS: HHPT   Patient  experiencing post op n/v. She is progressing from a knee standpoint. Will continue to follow throughout the day tomorrow to see progression and decide if patient will D/C today vs tomorrow         Donia Ast 09/29/2016, 7:01 AM

## 2016-09-29 NOTE — Progress Notes (Signed)
Physical Therapy Treatment Patient Details Name: Audrey Farley MRN: [id number] DOB: Dec 23, 1960 Today's Date: 09/29/2016    History of Present Illness Pt is a 56 y/o female s/p elective L TKA. PMH includes Aneurysm of ascending aorta, and L knee arthroscopy.     PT Comments    Pt mobilizing well this PM. Increased ambulation distance and negotiated stairs with min guard. Pt continues to report lightheadedness with ambulation, however no sway or LOB noted. Expected to d/c this PM or tomorrow AM. Will continue to follow acutely if still here.     Follow Up Recommendations  DC plan and follow up therapy as arranged by surgeon;Supervision/Assistance - 24 hour     Equipment Recommendations  None recommended by PT ([facility] DME at home )    Recommendations for Other Services       Precautions / Restrictions Precautions Precautions: Knee Precaution Booklet Issued: Yes (comment) (issued by PT) Restrictions Weight Bearing Restrictions: Yes LLE Weight Bearing: Weight bearing as tolerated    Mobility  Bed Mobility Overal bed mobility: Needs Assistance Bed Mobility: Supine to Sit     Supine to sit: Min guard;HOB elevated     General bed mobility comments: Min guard for safety, no physical assist needed  Transfers Overall transfer level: Needs assistance Equipment used: Rolling walker (2 wheeled) Transfers: Sit to/from [facility] Sit to Stand: Min guard Stand pivot transfers: Min guard       General transfer comment: Cueing for hand placement when rising from EOB. Pt initially able to control descent into chair, however flops the last few inches. Explained improtance of slowly lowering into chair.  Ambulation/Gait Ambulation/Gait assistance: Min guard Ambulation Distance (Feet): 150 Feet Assistive device: Rolling walker (2 wheeled) Gait Pattern/deviations: Step-to pattern;Decreased step length - right;Decreased step length - left;Decreased weight shift to  left;Antalgic Gait velocity: Decreased Gait velocity interpretation: Below normal speed for age/gender General Gait Details: Slow, antalgic gait secondary to post op pain and weakness. Pt reported dizziness with ambulation however was able to safely continue with out LOB for 150 ft.   Stairs Stairs: Yes   Stair Management: No rails;Step to pattern;Backwards;With walker Number of Stairs: 4 General stair comments: Daughter present for stair demonstration and instructed on bracing walker while pt negotiated steps.   Wheelchair Mobility    Modified Rankin (Stroke Patients Only)       Balance Overall balance assessment: Needs assistance Sitting-balance support: No upper extremity supported;Feet supported Sitting balance-Leahy Scale: Fair     Standing balance support: Bilateral upper extremity supported;During functional activity Standing balance-Leahy Scale: Poor Standing balance comment: Reliant on RW for stability                            Cognition Arousal/Alertness: Awake/alert Behavior During Therapy: WFL for tasks assessed/performed Overall Cognitive Status: Within Functional Limits for tasks assessed                                        Exercises      General Comments        Pertinent Vitals/Pain Pain Assessment: No/denies pain Pain Location: L Knee Pain Descriptors / Indicators: Aching;Operative site guarding;Sore Pain Intervention(s): Monitored during session    Home Living                      Prior Function  PT Goals (current goals can now be found in the care plan section) Acute Rehab PT Goals Patient Stated Goal: to decrease pain  PT Goal Formulation: With patient Time For Goal Achievement: 10/05/16 Potential to Achieve Goals: Good Progress towards PT goals: Progressing toward goals    Frequency    7X/week      PT Plan Current plan remains appropriate    Co-evaluation               AM-PAC PT "6 Clicks" Daily Activity  Outcome Measure  Difficulty turning over in bed (including adjusting bedclothes, sheets and blankets)?: A Lot Difficulty moving from lying on back to sitting on the side of the bed? : Total Difficulty sitting down on and standing up from a chair with arms (e.g., wheelchair, bedside commode, etc,.)?: Total Help needed moving to and from a bed to chair (including a wheelchair)?: A Little Help needed walking in hospital room?: A Little Help needed climbing 3-5 steps with a railing? : A Little 6 Click Score: 13    End of Session Equipment Utilized During Treatment: Gait belt Activity Tolerance: Treatment limited secondary to medical complications (Comment);Patient limited by pain (lightheadedness ) Patient left: with call bell/phone within reach;with family/visitor present;in bed Nurse Communication: Mobility status;Patient requests pain meds (dizziness, nausea, and low pulse rate) PT Visit Diagnosis: Other abnormalities of gait and mobility (R26.89);Pain Pain - Right/Left: Left Pain - part of body: Knee     Time: 4503-8882 PT Time Calculation (min) (ACUTE ONLY): 19 min  Charges:  $Gait Training: 8-22 mins                    G Codes:       Benjiman Core, [place] Pager [phone] Acute Rehab   Allena Katz 09/29/2016, 3:27 PM

## 2016-09-29 NOTE — Discharge Summary (Signed)
SPORTS MEDICINE & JOINT REPLACEMENT   Audrey Mulch, MD   Audrey Shadow, PA-C [address], [place], [place]  [address]                             [phone]  PATIENT ID: Audrey Farley        MRN:  [id number]          DOB/AGE: 1960-08-22 / 56 y.o.    DISCHARGE SUMMARY  ADMISSION DATE:    09/28/2016 DISCHARGE DATE:   09/29/2016   ADMISSION DIAGNOSIS: primary osteoarthritis left knee    DISCHARGE DIAGNOSIS:  primary osteoarthritis left knee    ADDITIONAL DIAGNOSIS: Active Problems:   S/P total knee replacement  Past Medical History:  Diagnosis Date  . Allergy   . Aneurysm of ascending aorta ([place]) 02/25/2016   3.9cm by echo 02/2016  -aorta enlarged  . Arthritis   . Chest pain 01/24/2016  . Dyspnea on exertion   . Hypertension   . Urge incontinence     PROCEDURE: Procedure(s): LEFT TOTAL KNEE ARTHROPLASTY on 09/28/2016  CONSULTS:    HISTORY:  See H&P in chart  HOSPITAL COURSE:  Audrey Farley is a 56 y.o. admitted on 09/28/2016 and found to have a diagnosis of primary osteoarthritis left knee.  After appropriate laboratory studies were obtained  they were taken to the operating room on 09/28/2016 and underwent Procedure(s): LEFT TOTAL KNEE ARTHROPLASTY.   They were given perioperative antibiotics:  Anti-infectives    Start     Dose/Rate Route Frequency Ordered Stop   09/28/16 1400  ceFAZolin (ANCEF) IVPB 1 g/50 mL premix     1 g 100 mL/hr over 30 Minutes Intravenous Every 6 hours 09/28/16 1252 09/28/16 1948   09/28/16 0800  ceFAZolin (ANCEF) 3 g in dextrose 5 % 50 mL IVPB     3 g 130 mL/hr over 30 Minutes Intravenous To ShortStay Surgical 09/25/16 1118 09/28/16 0837    .  Patient given tranexamic acid IV or topical and exparel intra-operatively.  Tolerated the procedure well.    POD# 1: Vital signs were stable.  Patient denied Chest pain, shortness of breath, or calf pain.  Patient was started on Lovenox 30 mg subcutaneously twice daily at 8am.   Consults to PT, OT, and care management were made.  The patient was weight bearing as tolerated.  CPM was placed on the operative leg 0-90 degrees for 6-8 hours a day. When out of the CPM, patient was placed in the foam block to achieve full extension. Incentive spirometry was taught.  Dressing was changed.       POD #2, Continued  PT for ambulation and exercise program.  IV saline locked.  O2 discontinued.    The remainder of the hospital course was dedicated to ambulation and strengthening.   The patient was discharged on 1 Day Post-Op in  Good condition.  Blood products given:none  DIAGNOSTIC STUDIES: Recent vital signs: Patient Vitals for the past 24 hrs:  BP Temp Temp src Pulse Resp SpO2  09/29/16 1421 120/70 97.9 F (36.6 C) Tympanic 63 17 95 %  09/29/16 1100 - - - (!) 47 - -  09/29/16 0550 129/75 (!) 97.5 F (36.4 C) Oral (!) 57 16 99 %  09/29/16 0101 140/80 (!) 97.5 F (36.4 C) Oral 61 18 94 %  09/28/16 2101 (!) 156/76 97.8 F (36.6 C) Oral 61 18 96 %  Recent laboratory studies:  Recent Labs  09/29/16 0311  WBC 11.7*  HGB 11.3*  HCT 33.1*  PLT 240    Recent Labs  09/29/16 0311  NA 133*  K 3.5  CL 100*  CO2 22  BUN 17  CREATININE 1.05*  GLUCOSE 155*  CALCIUM 8.7*   Lab Results  Component Value Date   INR 0.98 01/17/2013     Recent Radiographic Studies :  No results found.  DISCHARGE INSTRUCTIONS: Discharge Instructions    CPM    Complete by:  As directed    Continuous passive motion machine (CPM):      Use the CPM from 0 to 90 for 4-6 hours per day.      You may increase by 10 per day.  You may break it up into 2 or 3 sessions per day.      Use CPM for 2 weeks or until you are told to stop.   Call MD / Call 911    Complete by:  As directed    If you experience chest pain or shortness of breath, CALL 911 and be transported to the hospital emergency room.  If you develope a fever above 101 F, pus (white drainage) or increased drainage or  redness at the wound, or calf pain, call your surgeon's office.   Constipation Prevention    Complete by:  As directed    Drink plenty of fluids.  Prune juice may be helpful.  You may use a stool softener, such as Colace (over the counter) 100 mg twice a day.  Use MiraLax (over the counter) for constipation as needed.   Diet - low sodium heart healthy    Complete by:  As directed    Discharge instructions    Complete by:  As directed    INSTRUCTIONS AFTER JOINT REPLACEMENT   Remove items at home which could result in a fall. This includes throw rugs or furniture in walking pathways ICE to the affected joint every three hours while awake for 30 minutes at a time, for at least the first 3-5 days, and then as needed for pain and swelling.  Continue to use ice for pain and swelling. You may notice swelling that will progress down to the foot and ankle.  This is normal after surgery.  Elevate your leg when you are not up walking on it.   Continue to use the breathing machine you got in the hospital (incentive spirometer) which will help keep your temperature down.  It is common for your temperature to cycle up and down following surgery, especially at night when you are not up moving around and exerting yourself.  The breathing machine keeps your lungs expanded and your temperature down.   DIET:  As you were doing prior to hospitalization, we recommend a well-balanced diet.  DRESSING / WOUND CARE / SHOWERING  Keep the surgical dressing until follow up.  The dressing is water proof, so you can shower without any extra covering.  IF THE DRESSING FALLS OFF or the wound gets wet inside, change the dressing with sterile gauze.  Please use good hand washing techniques before changing the dressing.  Do not use any lotions or creams on the incision until instructed by your surgeon.    ACTIVITY  Increase activity slowly as tolerated, but follow the weight bearing instructions below.   No driving for 6  weeks or until further direction given by your physician.  You cannot drive while taking narcotics.  No lifting or carrying greater than 10 lbs. until further directed by your surgeon. Avoid periods of inactivity such as sitting longer than an hour when not asleep. This helps prevent blood clots.  You may return to work once you are authorized by your doctor.     WEIGHT BEARING   Weight bearing as tolerated with assist device (walker, cane, etc) as directed, use it as long as suggested by your surgeon or therapist, typically at least 4-6 weeks.   EXERCISES  Results after joint replacement surgery are often greatly improved when you follow the exercise, range of motion and muscle strengthening exercises prescribed by your doctor. Safety measures are also important to protect the joint from further injury. Any time any of these exercises cause you to have increased pain or swelling, decrease what you are doing until you are comfortable again and then slowly increase them. If you have problems or questions, call your caregiver or physical therapist for advice.   Rehabilitation is important following a joint replacement. After just a few days of immobilization, the muscles of the leg can become weakened and shrink (atrophy).  These exercises are designed to build up the tone and strength of the thigh and leg muscles and to improve motion. Often times heat used for twenty to thirty minutes before working out will loosen up your tissues and help with improving the range of motion but do not use heat for the first two weeks following surgery (sometimes heat can increase post-operative swelling).   These exercises can be done on a training (exercise) mat, on the floor, on a table or on a bed. Use whatever works the best and is most comfortable for you.    Use music or television while you are exercising so that the exercises are a pleasant break in your day. This will make your life better with the  exercises acting as a break in your routine that you can look forward to.   Perform all exercises about fifteen times, three times per day or as directed.  You should exercise both the operative leg and the other leg as well.   Exercises include:   Quad Sets - Tighten up the muscle on the front of the thigh (Quad) and hold for 5-10 seconds.   Straight Leg Raises - With your knee straight (if you were given a brace, keep it on), lift the leg to 60 degrees, hold for 3 seconds, and slowly lower the leg.  Perform this exercise against resistance later as your leg gets stronger.  Leg Slides: Lying on your back, slowly slide your foot toward your buttocks, bending your knee up off the floor (only go as far as is comfortable). Then slowly slide your foot back down until your leg is flat on the floor again.  Angel Wings: Lying on your back spread your legs to the side as far apart as you can without causing discomfort.  Hamstring Strength:  Lying on your back, push your heel against the floor with your leg straight by tightening up the muscles of your buttocks.  Repeat, but this time bend your knee to a comfortable angle, and push your heel against the floor.  You may put a pillow under the heel to make it more comfortable if necessary.   A rehabilitation program following joint replacement surgery can speed recovery and prevent re-injury in the future due to weakened muscles. Contact your doctor or a physical therapist for more information on knee rehabilitation.  CONSTIPATION  Constipation is defined medically as fewer than three stools per week and severe constipation as less than one stool per week.  Even if you have a regular bowel pattern at home, your normal regimen is likely to be disrupted due to multiple reasons following surgery.  Combination of anesthesia, postoperative narcotics, change in appetite and fluid intake all can affect your bowels.   YOU MUST use at least one of the following  options; they are listed in order of increasing strength to get the job done.  They are all available over the counter, and you may need to use some, POSSIBLY even all of these options:    Drink plenty of fluids (prune juice may be helpful) and high fiber foods Colace 100 mg by mouth twice a day  Senokot for constipation as directed and as needed Dulcolax (bisacodyl), take with full glass of water  Miralax (polyethylene glycol) once or twice a day as needed.  If you have tried all these things and are unable to have a bowel movement in the first 3-4 days after surgery call either your surgeon or your primary doctor.    If you experience loose stools or diarrhea, hold the medications until you stool forms back up.  If your symptoms do not get better within 1 week or if they get worse, check with your doctor.  If you experience "the worst abdominal pain ever" or develop nausea or vomiting, please contact the office immediately for further recommendations for treatment.   ITCHING:  If you experience itching with your medications, try taking only a single pain pill, or even half a pain pill at a time.  You can also use Benadryl over the counter for itching or also to help with sleep.   TED HOSE STOCKINGS:  Use stockings on both legs until for at least 2 weeks or as directed by physician office. They may be removed at night for sleeping.  MEDICATIONS:  See your medication summary on the "After Visit Summary" that nursing will review with you.  You may have some home medications which will be placed on hold until you complete the course of blood thinner medication.  It is important for you to complete the blood thinner medication as prescribed.  PRECAUTIONS:  If you experience chest pain or shortness of breath - call 911 immediately for transfer to the hospital emergency department.   If you develop a fever greater that 101 F, purulent drainage from wound, increased redness or drainage from wound, foul  odor from the wound/dressing, or calf pain - CONTACT YOUR SURGEON.                                                   FOLLOW-UP APPOINTMENTS:  If you do not already have a post-op appointment, please call the office for an appointment to be seen by your surgeon.  Guidelines for how soon to be seen are listed in your "After Visit Summary", but are typically between 1-4 weeks after surgery.  OTHER INSTRUCTIONS:   Knee Replacement:  Do not place pillow under knee, focus on keeping the knee straight while resting. CPM instructions: 0-90 degrees, 2 hours in the morning, 2 hours in the afternoon, and 2 hours in the evening. Place foam block, curve side up under heel at all times except when in CPM or  when walking.  DO NOT modify, tear, cut, or change the foam block in any way.  MAKE SURE YOU:  Understand these instructions.  Get help right away if you are not doing well or get worse.    Thank you for letting us be a part of your medical care team.  It is a privilege we respect greatly.  We hope these instructions will help you stay on track for a fast and full recovery!   Increase activity slowly as tolerated    Complete by:  As directed       DISCHARGE MEDICATIONS:   Allergies as of 09/29/2016      Reactions   No Known Allergies       Medication List    STOP taking these medications   ibuprofen 200 MG tablet Commonly known as:  ADVIL,MOTRIN     TAKE these medications   aspirin 325 MG EC tablet Take 1 tablet (325 mg total) by mouth 2 (two) times daily.   methocarbamol 500 MG tablet Commonly known as:  ROBAXIN Take 1-2 tablets (500-1,000 mg total) by mouth every 6 (six) hours as needed for muscle spasms.   ondansetron 4 MG tablet Commonly known as:  ZOFRAN Take 1 tablet (4 mg total) by mouth every 6 (six) hours as needed for nausea.   oxybutynin 10 MG 24 hr tablet Commonly known as:  DITROPAN-XL Take 1 tablet (10 mg total) by mouth at bedtime.   Oxycodone HCl 10 MG Tabs Take 1  tablet (10 mg total) by mouth every 4 (four) hours as needed for breakthrough pain.   telmisartan-hydrochlorothiazide 80-25 MG tablet Commonly known as:  MICARDIS HCT Take 1 tablet by mouth every evening.            Durable Medical Equipment        Start     Ordered   09/28/16 1253  DME Walker rolling  Once    Question:  Patient needs a walker to treat with the following condition  Answer:  S/P total knee replacement   09/28/16 1252   09/28/16 1253  DME 3 n 1  Once     09/28/16 1252   09/28/16 1253  DME Bedside commode  Once    Question:  Patient needs a bedside commode to treat with the following condition  Answer:  S/P total knee replacement   09/28/16 1252      FOLLOW UP VISIT:   Follow-up Information    [facility] Follow up.   Specialty:  [place] Why:  A representative from [facility] will contact you to arrange start date and time for your therapy. Contact information: [address] [place] [address] [phone]           DISPOSITION: HOME VS. SNF  CONDITION:  Good   Donia Ast 09/29/2016, 4:20 PM

## 2016-09-29 NOTE — Progress Notes (Signed)
Physical Therapy Treatment Patient Details Name: Audrey Farley MRN: [id number] DOB: Jul 06, 1960 Today's Date: 09/29/2016    History of Present Illness Pt is a 56 y/o female s/p elective L TKA. PMH includes Aneurysm of ascending aorta, and L knee arthroscopy.     PT Comments    Pt is very willing to participate with therapy, however is limited by lightheadedness and nausea. During ambulation, pt became very dizzy requiring seated rest break. Pt SOB however SpO2 remained above 97; however pulse rate dropped to 47 bpm while seated, raised to 58 bpm after returning to ambulation. RN notified of pts vitals. Patient would benefit from continued skilled PT to increase activity tolerance. Will continue to follow acutely.      Follow Up Recommendations  DC plan and follow up therapy as arranged by surgeon;Supervision/Assistance - 24 hour     Equipment Recommendations  None recommended by PT ([facility] DME at home )    Recommendations for Other Services       Precautions / Restrictions Precautions Precautions: Knee Precaution Booklet Issued: Yes (comment) (issued by PT) Precaution Comments: Reviewed seated HEP Restrictions Weight Bearing Restrictions: Yes LLE Weight Bearing: Weight bearing as tolerated    Mobility  Bed Mobility Overal bed mobility: Needs Assistance Bed Mobility: Supine to Sit     Supine to sit: Min assist;HOB elevated     General bed mobility comments: Min a to enter L side of bed and progress LLE onto bed. Pt reports no assist needed to enter R side of bed.  Transfers Overall transfer level: Needs assistance Equipment used: Rolling walker (2 wheeled) Transfers: Sit to/from Stand Sit to Stand: Min guard         General transfer comment: Good technique to rise from recliner into standing. No assist needed. Min guard as pt reports dizziness. One instance of uncontrolled descent into recliner secondary to fatigue and dizziness.  Ambulation/Gait Ambulation/Gait  assistance: Min guard Ambulation Distance (Feet): 20 Feet (x2) Assistive device: Rolling walker (2 wheeled) Gait Pattern/deviations: Step-to pattern;Decreased step length - right;Decreased step length - left;Decreased weight shift to left;Antalgic Gait velocity: Decreased Gait velocity interpretation: Below normal speed for age/gender General Gait Details: Slow, antalgic gait secondary to post op pain and weakness. Limited tolerance secondary to increased pain and lightheadedness.   Stairs            Wheelchair Mobility    Modified Rankin (Stroke Patients Only)       Balance Overall balance assessment: Needs assistance Sitting-balance support: No upper extremity supported;Feet supported Sitting balance-Leahy Scale: Fair     Standing balance support: Bilateral upper extremity supported;During functional activity Standing balance-Leahy Scale: Poor Standing balance comment: Reliant on RW for stability                            Cognition Arousal/Alertness: Awake/alert Behavior During Therapy: WFL for tasks assessed/performed Overall Cognitive Status: Within Functional Limits for tasks assessed                                        Exercises Total Joint Exercises Heel Slides: AROM;Left;10 reps;Seated (towel under foot) Long Arc Quad: AROM;Left;10 reps;Seated Knee Flexion: AROM;Left;5 reps;Seated (10 second holds)    General Comments General comments (skin integrity, edema, etc.): Pt reporting lightheadedness at start of session but wishing to participate; onset of n/v after room level functional  mobility, RN made aware       Pertinent Vitals/Pain Pain Assessment: 0-10 Pain Score: 9  (after ambulation) Faces Pain Scale: Hurts little more Pain Location: L Knee Pain Descriptors / Indicators: Aching;Operative site guarding;Sore Pain Intervention(s): Monitored during session;Limited activity within patient's tolerance;Repositioned;Patient  requesting pain meds-RN notified;Ice applied    Home Living Family/patient expects to be discharged to:: Private residence Living Arrangements: Children Available Help at Discharge: Family;Available 24 hours/day Type of Home: House Home Access: Stairs to enter Entrance Stairs-Rails: None Home Layout: Two level;Other (Comment);Able to live on main level with bedroom/bathroom (2 steps down to enter den) Home Equipment: Gilford Rile - 2 wheels;Bedside commode      Prior Function Level of Independence: Independent          PT Goals (current goals can now be found in the care plan section) Acute Rehab PT Goals Patient Stated Goal: to decrease pain  PT Goal Formulation: With patient Time For Goal Achievement: 10/05/16 Potential to Achieve Goals: Good Progress towards PT goals: Progressing toward goals (slowly)    Frequency    7X/week      PT Plan Current plan remains appropriate    Co-evaluation              AM-PAC PT "6 Clicks" Daily Activity  Outcome Measure  Difficulty turning over in bed (including adjusting bedclothes, sheets and blankets)?: A Lot Difficulty moving from lying on back to sitting on the side of the bed? : Total Difficulty sitting down on and standing up from a chair with arms (e.g., wheelchair, bedside commode, etc,.)?: Total Help needed moving to and from a bed to chair (including a wheelchair)?: A Little Help needed walking in hospital room?: A Little Help needed climbing 3-5 steps with a railing? : A Lot 6 Click Score: 12    End of Session Equipment Utilized During Treatment: Gait belt Activity Tolerance: Treatment limited secondary to medical complications (Comment);Patient limited by pain (lightheadedness ) Patient left: with call bell/phone within reach;with family/visitor present;in bed Nurse Communication: Mobility status;Patient requests pain meds (dizziness, nausea, and low pulse rate) PT Visit Diagnosis: Other abnormalities of gait and  mobility (R26.89);Pain Pain - Right/Left: Left Pain - part of body: Knee     Time: 0539-7673 PT Time Calculation (min) (ACUTE ONLY): 27 min  Charges:  $Gait Training: 8-22 mins $Therapeutic Exercise: 8-22 mins                    G Codes:       Benjiman Core, [place] Pager [phone] Acute Rehab    Allena Katz 09/29/2016, 11:19 AM

## 2016-09-29 NOTE — Op Note (Signed)
TOTAL KNEE REPLACEMENT OPERATIVE NOTE:  09/28/2016  11:32 AM  PATIENT:  Audrey Farley  56 y.o. female  PRE-OPERATIVE DIAGNOSIS:  primary osteoarthritis left knee  POST-OPERATIVE DIAGNOSIS:  primary osteoarthritis left knee  PROCEDURE:  Procedure(s): LEFT TOTAL KNEE ARTHROPLASTY  SURGEON:  Surgeon(s): Vickey Huger, MD  PHYSICIAN ASSISTANT:  Nehemiah Massed, [facility]  ANESTHESIA:   spinal  DRAINS: Hemovac  SPECIMEN: None  COUNTS:  Correct  TOURNIQUET:   Total Tourniquet Time Documented: Thigh (Left) - 48 minutes Total: Thigh (Left) - 48 minutes   DICTATION:  Indication for procedure:    The patient is a 56 y.o. female who has failed conservative treatment for primary osteoarthritis left knee.  Informed consent was obtained prior to anesthesia. The risks versus benefits of the operation were explain and in a way the patient can, and did, understand.   On the implant demand matching protocol, this patient scored 10.  Therefore, this patient was not receive a polyethylene insert with vitamin E which is a high demand implant.  Description of procedure:     The patient was taken to the operating room and placed under anesthesia.  The patient was positioned in the usual fashion taking care that all body parts were adequately padded and/or protected.  I foley catheter was placed.  A tourniquet was applied and the leg prepped and draped in the usual sterile fashion.  The extremity was exsanguinated with the esmarch and tourniquet inflated to 350 mmHg.  Pre-operative range of motion was normal.  The knee was in 6 degree of mild varus.  A midline incision approximately 6-7 inches long was made with a #10 blade.  A new blade was used to make a parapatellar arthrotomy going 2-3 cm into the quadriceps tendon, over the patella, and alongside the medial aspect of the patellar tendon.  A synovectomy was then performed with the #10 blade and forceps. I then elevated the deep MCL off the medial  tibial metaphysis subperiosteally around to the semimembranosus attachment.    I everted the patella and used calipers to measure patellar thickness.  I used the reamer to ream down to appropriate thickness to recreate the native thickness.  I then removed excess bone with the rongeur and sagittal saw.  I used the appropriately sized template and drilled the three lug holes.  I then put the trial in place and measured the thickness with the calipers to ensure recreation of the native thickness.  The trial was then removed and the patella subluxed and the knee brought into flexion.  A homan retractor was place to retract and protect the patella and lateral structures.  A Z-retractor was place medially to protect the medial structures.  The extra-medullary alignment system was used to make cut the tibial articular surface perpendicular to the anamotic axis of the tibia and in 3 degrees of posterior slope.  The cut surface and alignment jig was removed.  I then used the intramedullary alignment guide to make a 6 valgus cut on the distal femur.  I then marked out the epicondylar axis on the distal femur.  The posterior condylar axis measured 3 degrees.  I then used the anterior referencing sizer and measured the femur to be a size 8.  The 4-In-1 cutting block was screwed into place in external rotation matching the posterior condylar angle, making our cuts perpendicular to the epicondylar axis.  Anterior, posterior and chamfer cuts were made with the sagittal saw.  The cutting block and cut pieces were  removed.  A lamina spreader was placed in 90 degrees of flexion.  The ACL, PCL, menisci, and posterior condylar osteophytes were removed.  A 12 mm spacer blocked was found to offer good flexion and extension gap balance after mild in degree releasing.   The scoop retractor was then placed and the femoral finishing block was pinned in place.  The small sagittal saw was used as well as the lug drill to finish the  femur.  The block and cut surfaces were removed and the medullary canal hole filled with autograft bone from the cut pieces.  The tibia was delivered forward in deep flexion and external rotation.  A size D tray was selected and pinned into place centered on the medial 1/3 of the tibial tubercle.  The reamer and keel was used to prepare the tibia through the tray.    I then trialed with the size 8 femur, size D tibia, a 12 mm insert and the 32 patella.  I had excellent flexion/extension gap balance, excellent patella tracking.  Flexion was full and beyond 120 degrees; extension was zero.  These components were chosen and the staff opened them to me on the back table while the knee was lavaged copiously and the cement mixed.  The soft tissue was infiltrated with 60cc of exparel 1.3% through a 21 gauge needle.  I cemented in the components and removed all excess cement.  The polyethylene tibial component was snapped into place and the knee placed in extension while cement was hardening.  The capsule was infilltrated with 30cc of .25% Marcaine with epinephrine.  A hemovac was place in the joint exiting superolaterally.  A pain pump was place superomedially superficial to the arthrotomy.  Once the cement was hard, the tourniquet was let down.  Hemostasis was obtained.  The arthrotomy was closed with figure-8 #1 vicryl sutures.  The deep soft tissues were closed with #0 vicryls and the subcuticular layer closed with a running #2-0 vicryl.  The skin was reapproximated and closed with skin staples.  The wound was dressed with xeroform, 4 x4's, 2 ABD sponges, a single layer of webril and a TED stocking.   The patient was then awakened, extubated, and taken to the recovery room in stable condition.  BLOOD LOSS:  300cc DRAINS: 1 hemovac, 1 pain catheter COMPLICATIONS:  None.  PLAN OF CARE: Admit to inpatient   PATIENT DISPOSITION:  PACU - hemodynamically stable.   Delay start of Pharmacological VTE agent  (>24hrs) due to surgical blood loss or risk of bleeding:  not applicable  Please fax a copy of this op note to my office at [phone] (please only include page 1 and 2 of the Case Information op note)

## 2016-09-29 NOTE — Evaluation (Signed)
Occupational Therapy Evaluation Patient Details Name: Audrey Farley MRN: [id number] DOB: [birth year] Today's Date: 09/29/2016    History of Present Illness Pt is a 56 y/o female s/p elective L TKA. PMH includes Aneurysm of ascending aorta, and L knee arthroscopy.    Clinical Impression   This 56 y/o F presents with the above. At baseline Pt is independent with ADLs and functional mobility. Pt currently requires MinA with functional mobility, ModA for sit<>stand transfers, and MaxA for LB ADLs. Pt limited this session due to onset of n/v after room level functional mobility. Pt will have family assist at home after discharge. Will continue to follow while in acute setting to maximize Pt's safety and independence with ADLs and functional mobility prior to discharge home.     Follow Up Recommendations  DC plan and follow up therapy as arranged by surgeon;Supervision/Assistance - 24 hour    Equipment Recommendations  None recommended by OT;Other (comment) (has necessary DME at home )           Precautions / Restrictions Precautions Precautions: Knee Precaution Booklet Issued: Yes (comment) (issued by PT) Restrictions Weight Bearing Restrictions: Yes LLE Weight Bearing: Weight bearing as tolerated      Mobility Bed Mobility Overal bed mobility: Needs Assistance Bed Mobility: Supine to Sit     Supine to sit: Min guard;HOB elevated     General bed mobility comments: Minguard for safety with HOB elevated and handrails   Transfers Overall transfer level: Needs assistance Equipment used: Rolling walker (2 wheeled) Transfers: Sit to/from Stand Sit to Stand: Min guard;Mod assist         General transfer comment: MinA to rise and steady; ModA when rising from toilet; verbal cues for hand/LE placement     Balance Overall balance assessment: Needs assistance Sitting-balance support: No upper extremity supported;Feet supported Sitting balance-Leahy Scale: Fair      Standing balance support: Bilateral upper extremity supported;During functional activity Standing balance-Leahy Scale: Poor Standing balance comment: Reliant on RW for stability; wt bearing on forearms when washing hands while standing at sink                            ADL either performed or assessed with clinical judgement   ADL Overall ADL's : Needs assistance/impaired Eating/Feeding: Independent;Sitting   Grooming: Wash/dry hands;Min guard;Standing   Upper Body Bathing: Min guard;Sitting   Lower Body Bathing: Moderate assistance;Sit to/from stand   Upper Body Dressing : Min guard;Sitting   Lower Body Dressing: Maximal assistance;Sit to/from stand   Toilet Transfer: Ambulation;Comfort height toilet;Grab bars;RW;Moderate assistance Toilet Transfer Details (indicate cue type and reason): increased assist to rise from toilet  Toileting- Clothing Manipulation and Hygiene: Min guard;Sit to/from Nurse, children's Details (indicate cue type and reason): Pt plans to sponge bathe at home initially due to only having 1/2 bath downstairs Functional mobility during ADLs: Minimal assistance;Rolling walker General ADL Comments: Pt completed bed mobility room level functional mobility using RW to complete toileting and standing grooming ADLs, Pt with onset of n/v after toileting and grooming completion with RN aware.                          Pertinent Vitals/Pain Pain Assessment: Faces Faces Pain Scale: Hurts little more Pain Location: L Knee Pain Descriptors / Indicators: Aching;Operative site guarding;Sore Pain Intervention(s): Limited activity within patient's tolerance;Monitored during session;Premedicated before session;Ice applied  Hand Dominance Right   Extremity/Trunk Assessment Upper Extremity Assessment Upper Extremity Assessment: Overall WFL for tasks assessed   Lower Extremity Assessment Lower Extremity Assessment: Defer to PT  evaluation   Cervical / Trunk Assessment Cervical / Trunk Assessment: Kyphotic   Communication Communication Communication: No difficulties   Cognition Arousal/Alertness: Awake/alert Behavior During Therapy: WFL for tasks assessed/performed Overall Cognitive Status: Within Functional Limits for tasks assessed                                     General Comments  Pt reporting lightheadedness at start of session but wishing to participate; onset of n/v after room level functional mobility, RN made aware                Home Living Family/patient expects to be discharged to:: Private residence Living Arrangements: Children Available Help at Discharge: Family;Available 24 hours/day Type of Home: House Home Access: Stairs to enter [employer] of Steps: 3 Entrance Stairs-Rails: None Home Layout: Two level;Other (Comment);Able to live on main level with bedroom/bathroom (2 steps down to enter den)     Bathroom Shower/Tub: Other (comment) (1/2 bath downstairs )   Bathroom Toilet: Standard     Home Equipment: Environmental consultant - 2 wheels;Bedside commode          Prior Functioning/Environment Level of Independence: Independent                 OT Problem List: Decreased strength;Decreased range of motion;Decreased activity tolerance;Impaired balance (sitting and/or standing);Decreased knowledge of use of DME or AE      OT Treatment/Interventions: Self-care/ADL training;DME and/or AE instruction;Therapeutic activities;Balance training;Therapeutic exercise;Patient/family education    OT Goals(Current goals can be found in the care plan section) Acute Rehab OT Goals Patient Stated Goal: to decrease pain  OT Goal Formulation: With patient Time For Goal Achievement: 10/06/16 Potential to Achieve Goals: Good ADL Goals Pt Will Perform Grooming: with modified independence;standing Pt Will Perform Upper Body Bathing: with modified independence;sitting Pt  Will Perform Upper Body Dressing: with modified independence;sitting Pt Will Perform Lower Body Dressing: with min assist;sit to/from stand Pt Will Transfer to Toilet: with modified independence;ambulating;bedside commode (BSC over toilet ) Pt Will Perform Toileting - Clothing Manipulation and hygiene: with modified independence;sit to/from stand  OT Frequency: Min 2X/week                             AM-PAC PT "6 Clicks" Daily Activity     Outcome Measure Help from another person eating meals?: None Help from another person taking care of personal grooming?: A Little Help from another person toileting, which includes using toliet, bedpan, or urinal?: A Little Help from another person bathing (including washing, rinsing, drying)?: A Lot Help from another person to put on and taking off regular upper body clothing?: None Help from another person to put on and taking off regular lower body clothing?: A Lot 6 Click Score: 18   End of Session Equipment Utilized During Treatment: Gait belt;Rolling walker Nurse Communication: Mobility status  Activity Tolerance: Patient tolerated treatment well;Other (comment) (limited by n/v) Patient left: in chair;with call bell/phone within reach;with family/visitor present  OT Visit Diagnosis: Muscle weakness (generalized) (M62.81);Unsteadiness on feet (R26.81)                Time: 7026-3785 OT Time Calculation (min): 28 min Charges:  OT  General Charges $OT Visit: 1 Procedure OT Evaluation $OT Eval Low Complexity: 1 Procedure OT Treatments $Self Care/Home Management : 8-22 mins G-Codes: OT G-codes **NOT FOR INPATIENT CLASS** Functional Assessment Tool Used: AM-PAC 6 Clicks Daily Activity;Clinical judgement Functional Limitation: Self care Self Care Current Status (B8485): At least 40 percent but less than 60 percent impaired, limited or restricted Self Care Goal Status (T2763): At least 1 percent but less than 20 percent impaired, limited  or restricted   Audrey Farley, OT Pager [phone] 09/29/2016   Raymondo Band 09/29/2016, 9:55 AM

## 2016-12-22 ENCOUNTER — Other Ambulatory Visit: Payer: Self-pay | Admitting: Family Medicine

## 2016-12-24 NOTE — Telephone Encounter (Signed)
Please advise 

## 2016-12-28 NOTE — Telephone Encounter (Signed)
Please call --- patient will need six month follow-up with me in December 2018.  I approved medication for blood pressure for three months only.

## 2016-12-30 ENCOUNTER — Telehealth: Payer: Self-pay | Admitting: Family Medicine

## 2016-12-30 NOTE — Telephone Encounter (Signed)
lmom for pt to call back an schedule an OV with Audrey Farley for f/u on BP medicine and bloodwork

## 2016-12-30 NOTE — Telephone Encounter (Signed)
lmom for pt to call back an schedule an OV with Tamala Julian for f/u on BP medicine and bloodwork

## 2017-01-25 ENCOUNTER — Other Ambulatory Visit: Payer: Self-pay | Admitting: Family Medicine

## 2017-01-25 DIAGNOSIS — N3941 Urge incontinence: Secondary | ICD-10-CM

## 2017-04-20 ENCOUNTER — Other Ambulatory Visit: Payer: Self-pay

## 2017-04-20 ENCOUNTER — Telehealth: Payer: Self-pay | Admitting: Family Medicine

## 2017-04-20 MED ORDER — TELMISARTAN-HCTZ 80-25 MG PO TABS: 1.0000 | ORAL_TABLET | Freq: Every day | ORAL | 0 refills | Status: DC

## 2017-04-20 NOTE — Telephone Encounter (Signed)
Copied from [place] [id number]. Topic: Quick Communication - Rx Refill/Question >> Apr 20, 2017 11:28 AM Bennye Alm wrote: Medication: telmisartan-hydrochlorothiazide (MICARDIS HCT) 80-25 MG tablet   Has the patient contacted their pharmacy?  Yes, but was told she had to call the office because she had no refills   (Agent: If no, request that the patient contact the pharmacy for the refill.)   Preferred Pharmacy (with phone number or street name): [facility] on [facility] [address] [facility].  Agent: Please be advised that RX refills may take up to 3 business days. We ask that you follow-up with your pharmacy.  Pt has CPE scheduled for 05/24/17 with Dr. Tamala Julian.

## 2017-05-24 ENCOUNTER — Ambulatory Visit (INDEPENDENT_AMBULATORY_CARE_PROVIDER_SITE_OTHER): Payer: Managed Care, Other (non HMO) | Admitting: Family Medicine

## 2017-05-24 ENCOUNTER — Other Ambulatory Visit: Payer: Self-pay

## 2017-05-24 ENCOUNTER — Encounter: Payer: Self-pay | Admitting: Family Medicine

## 2017-05-24 VITALS — BP 138/82 | HR 74 | Temp 98.0°F | Resp 16 | Ht 63.19 in | Wt 229.0 lb

## 2017-05-24 DIAGNOSIS — K219 Gastro-esophageal reflux disease without esophagitis: Secondary | ICD-10-CM

## 2017-05-24 DIAGNOSIS — Z131 Encounter for screening for diabetes mellitus: Secondary | ICD-10-CM

## 2017-05-24 DIAGNOSIS — Z0000 Encounter for general adult medical examination without abnormal findings: Secondary | ICD-10-CM | POA: Diagnosis not present

## 2017-05-24 DIAGNOSIS — I10 Essential (primary) hypertension: Secondary | ICD-10-CM

## 2017-05-24 DIAGNOSIS — I712 Thoracic aortic aneurysm, without rupture: Secondary | ICD-10-CM

## 2017-05-24 DIAGNOSIS — E559 Vitamin D deficiency, unspecified: Secondary | ICD-10-CM

## 2017-05-24 DIAGNOSIS — N3941 Urge incontinence: Secondary | ICD-10-CM | POA: Diagnosis not present

## 2017-05-24 DIAGNOSIS — J301 Allergic rhinitis due to pollen: Secondary | ICD-10-CM | POA: Diagnosis not present

## 2017-05-24 DIAGNOSIS — Z1211 Encounter for screening for malignant neoplasm of colon: Secondary | ICD-10-CM

## 2017-05-24 DIAGNOSIS — Z6841 Body Mass Index (BMI) 40.0 and over, adult: Secondary | ICD-10-CM | POA: Diagnosis not present

## 2017-05-24 DIAGNOSIS — I7121 Aneurysm of the ascending aorta, without rupture: Secondary | ICD-10-CM

## 2017-05-24 LAB — POCT URINALYSIS DIP (MANUAL ENTRY)
BILIRUBIN UA: NEGATIVE
Glucose, UA: NEGATIVE mg/dL
Ketones, POC UA: NEGATIVE mg/dL
LEUKOCYTES UA: NEGATIVE
NITRITE UA: NEGATIVE
PH UA: 5 (ref 5.0–8.0)
PROTEIN UA: NEGATIVE mg/dL
RBC UA: NEGATIVE
Spec Grav, UA: 1.025 (ref 1.010–1.025)
UROBILINOGEN UA: 0.2 U/dL

## 2017-05-24 MED ORDER — PANTOPRAZOLE SODIUM 40 MG PO TBEC: 40.0000 mg | DELAYED_RELEASE_TABLET | Freq: Every day | ORAL | 0 refills | Status: DC

## 2017-05-24 MED ORDER — TELMISARTAN-HCTZ 80-12.5 MG PO TABS: 1.0000 | ORAL_TABLET | Freq: Every day | ORAL | 1 refills | Status: DC

## 2017-05-24 MED ORDER — OXYBUTYNIN CHLORIDE ER 10 MG PO TB24: 10.0000 mg | ORAL_TABLET | Freq: Every day | ORAL | 3 refills | Status: DC

## 2017-05-24 NOTE — Patient Instructions (Addendum)
IF you received an x-ray today, you will receive an invoice from [facility]. Please contact [facility] at [phone] with questions or concerns regarding your invoice.   IF you received labwork today, you will receive an invoice from [facility]. Please contact [facility] at [phone] with questions or concerns regarding your invoice.   Our billing staff will not be able to assist you with questions regarding bills from these companies.  You will be contacted with the lab results as soon as they are available. The fastest way to get your results is to activate your My Chart account. Instructions are located on the last page of this paperwork. If you have not heard from [place] regarding the results in 2 weeks, please contact this office.      Preventive Care 40-64 Years, Female Preventive care refers to lifestyle choices and visits with your health care provider that can promote health and wellness. What does preventive care include?  A yearly physical exam. This is also called an annual well check.  Dental exams once or twice a year.  Routine eye exams. Ask your health care provider how often you should have your eyes checked.  Personal lifestyle choices, including: ? Daily care of your teeth and gums. ? Regular physical activity. ? Eating a healthy diet. ? Avoiding tobacco and drug use. ? Limiting alcohol use. ? Practicing safe sex. ? Taking low-dose aspirin daily starting at age 34. ? Taking vitamin and mineral supplements as recommended by your health care provider. What happens during an annual well check? The services and screenings done by your health care provider during your annual well check will depend on your age, overall health, lifestyle risk factors, and family history of disease. Counseling Your health care provider may ask you questions about your:  Alcohol use.  Tobacco use.  Drug use.  Emotional well-being.  Home and relationship  well-being.  Sexual activity.  Eating habits.  Work and work Statistician.  Method of birth control.  Menstrual cycle.  Pregnancy history.  Screening You may have the following tests or measurements:  Height, weight, and BMI.  Blood pressure.  Lipid and cholesterol levels. These may be checked every 5 years, or more frequently if you are over 65 years old.  Skin check.  Lung cancer screening. You may have this screening every year starting at age 102 if you have a 30-pack-year history of smoking and currently smoke or have quit within the past 15 years.  Fecal occult blood test (FOBT) of the stool. You may have this test every year starting at age 33.  Flexible sigmoidoscopy or colonoscopy. You may have a sigmoidoscopy every 5 years or a colonoscopy every 10 years starting at age 23.  Hepatitis C blood test.  Hepatitis B blood test.  Sexually transmitted disease (STD) testing.  Diabetes screening. This is done by checking your blood sugar (glucose) after you have not eaten for a while (fasting). You may have this done every 1-3 years.  Mammogram. This may be done every 1-2 years. Talk to your health care provider about when you should start having regular mammograms. This may depend on whether you have a family history of breast cancer.  BRCA-related cancer screening. This may be done if you have a family history of breast, ovarian, tubal, or peritoneal cancers.  Pelvic exam and Pap test. This may be done every 3 years starting at age 3. Starting at age 55, this may be done every 5 years if  you have a Pap test in combination with an HPV test.  Bone density scan. This is done to screen for osteoporosis. You may have this scan if you are at high risk for osteoporosis.  Discuss your test results, treatment options, and if necessary, the need for more tests with your health care provider. Vaccines Your health care provider may recommend certain vaccines, such  as:  Influenza vaccine. This is recommended every year.  Tetanus, diphtheria, and acellular pertussis (Tdap, Td) vaccine. You may need a Td booster every 10 years.  Varicella vaccine. You may need this if you have not been vaccinated.  Zoster vaccine. You may need this after age 77.  Measles, mumps, and rubella (MMR) vaccine. You may need at least one dose of MMR if you were born in 1957 or later. You may also need a second dose.  Pneumococcal 13-valent conjugate (PCV13) vaccine. You may need this if you have certain conditions and were not previously vaccinated.  Pneumococcal polysaccharide (PPSV23) vaccine. You may need one or two doses if you smoke cigarettes or if you have certain conditions.  Meningococcal vaccine. You may need this if you have certain conditions.  Hepatitis A vaccine. You may need this if you have certain conditions or if you travel or work in places where you may be exposed to hepatitis A.  Hepatitis B vaccine. You may need this if you have certain conditions or if you travel or work in places where you may be exposed to hepatitis B.  Haemophilus influenzae type b (Hib) vaccine. You may need this if you have certain conditions.  Talk to your health care provider about which screenings and vaccines you need and how often you need them. This information is not intended to replace advice given to you by your health care provider. Make sure you discuss any questions you have with your health care provider. Document Released: 03/22/2015 Document Revised: 11/13/2015 Document Reviewed: 12/25/2014 Elsevier Interactive Patient Education  [employer].

## 2017-05-24 NOTE — Progress Notes (Signed)
Subjective:    Patient ID: Audrey Farley, female    DOB: November 14, 1960, 57 y.o.   MRN: [id number]  05/24/2017  Annual Exam    HPI This 57 y.o. female presents for Complete Physical Examination.  Last physical:  12-20-2015 Pap smear:  2015; hysterectomy; 03/2017 [place] Mammogram:  2014; 03/2017 Colonoscopy:  Referred by [facility] exam:  Every year; upcoming next month Dental exam:  Due    Nausea at night, heartburn; excessive gas. Taking tums.  Onset five months ago.was taking excessive ibuprofen.  Urge incontinence; accidents every day.      Visual Acuity Screening   Right eye Left eye Both eyes  Without correction:     With correction: 20/20 20/20 20/20     BP Readings from Last 3 Encounters:  05/24/17 138/82  09/29/16 120/70  09/18/16 (!) 158/80   Wt Readings from Last 3 Encounters:  05/24/17 229 lb (103.9 kg)  09/18/16 231 lb 12.8 oz (105.1 kg)  08/08/16 226 lb 2 oz (102.6 kg)   Immunization History  Administered Date(s) Administered  . Influenza,inj,Quad PF,6+ Mos 12/20/2015  . Tdap 08/31/2014    Review of Systems  Constitutional: Negative for activity change, appetite change, chills, diaphoresis, fatigue, fever and unexpected weight change.  HENT: Negative for congestion, dental problem, drooling, ear discharge, ear pain, facial swelling, hearing loss, mouth sores, nosebleeds, postnasal drip, rhinorrhea, sinus pressure, sneezing, sore throat, tinnitus, trouble swallowing and voice change.   Eyes: Negative for photophobia, pain, discharge, redness, itching and visual disturbance.  Respiratory: Negative for apnea, cough, choking, chest tightness, shortness of breath, wheezing and stridor.   Cardiovascular: Negative for chest pain, palpitations and leg swelling.  Gastrointestinal: Negative for abdominal distention, abdominal pain, anal bleeding, blood in stool, constipation, diarrhea, nausea, rectal pain and vomiting.  Endocrine: Negative for cold intolerance,  heat intolerance, polydipsia, polyphagia and polyuria.  Genitourinary: Negative for decreased urine volume, difficulty urinating, dyspareunia, dysuria, enuresis, flank pain, frequency, genital sores, hematuria, menstrual problem, pelvic pain, urgency, vaginal bleeding, vaginal discharge and vaginal pain.  Musculoskeletal: Negative for arthralgias, back pain, gait problem, joint swelling, myalgias, neck pain and neck stiffness.  Skin: Negative for color change, pallor, rash and wound.  Allergic/Immunologic: Negative for environmental allergies, food allergies and immunocompromised state.  Neurological: Negative for dizziness, tremors, seizures, syncope, facial asymmetry, speech difficulty, weakness, light-headedness, numbness and headaches.  Hematological: Negative for adenopathy. Does not bruise/bleed easily.  Psychiatric/Behavioral: Negative for agitation, behavioral problems, confusion, decreased concentration, dysphoric mood, hallucinations, self-injury, sleep disturbance and suicidal ideas. The patient is not nervous/anxious and is not hyperactive.     Past Medical History:  Diagnosis Date  . Allergy   . Aneurysm of ascending aorta ([facility]) 02/25/2016   3.9cm by echo 02/2016  -aorta enlarged  . Arthritis   . Chest pain 01/24/2016  . Dyspnea on exertion   . Hypertension   . Urge incontinence    Past Surgical History:  Procedure Laterality Date  . ABDOMINAL HYSTERECTOMY     still has cervix; DUB; ovaries intact.  [facility] KNEE ARTHROSCOPY Left 2012  . 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 TOTAL KNEE ARTHROPLASTY Left 09/28/2016   Procedure: LEFT TOTAL KNEE ARTHROPLASTY;  Surgeon: Vickey Huger, MD;  Location: [place];  Service: Orthopedics;  Laterality: Left;   Allergies  Allergen Reactions  . No Known Allergies    No current outpatient medications on file prior to visit.   No current facility-administered medications on file prior to visit.    Social History  Socioeconomic History    . Marital status: Single    Spouse name: Not on file  . Number of children: Not on file  . Years of education: Not on file  . Highest education level: Not on file  Occupational History  . Not on file  Social Needs  . Financial resource strain: Not on file  . Food insecurity:    Worry: Not on file    Inability: Not on file  . Transportation needs:    Medical: Not on file    Non-medical: Not on file  Tobacco Use  . Smoking status: Never Smoker  . Smokeless tobacco: Never Used  Substance and Sexual Activity  . Alcohol use: Yes    Comment: occ vodka  . Drug use: No  . Sexual activity: Yes    Birth control/protection: Surgical, Post-menopausal  Lifestyle  . Physical activity:    Days per week: Not on file    Minutes per session: Not on file  . Stress: Not on file  Relationships  . Social connections:    Talks on phone: Not on file    Gets together: Not on file    Attends religious service: Not on file    Active member of club or organization: Not on file    Attends meetings of clubs or organizations: Not on file    Relationship status: Not on file  . Intimate partner violence:    Fear of current or ex partner: Not on file    Emotionally abused: Not on file    Physically abused: Not on file    Forced sexual activity: Not on file  Other Topics Concern  . 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        Tobacco: none      Alcohol; weekends/socially      Drugs: none      Exercise:  Two days per week; cardio      Seatbelt: 100%; no texting      Sexual activity: not sexually active since 2014; dates males; no STDs; total partners = 7.   Family History  Problem Relation Age of Onset  . Diabetes Mother   .  Hypertension Mother   . Heart attack Mother   . Hyperlipidemia Mother   . Heart disease Mother        heart problems; no AMI  . Kidney disease Mother   . Hyperlipidemia Sister   . Hypertension Sister   . Multiple sclerosis Sister   . Mental illness Brother   . Epilepsy Brother   . Diabetes Sister   . Sudden death Neg Hx        Objective:    BP 138/82   Pulse 74   Temp 98 F (36.7 C) (Oral)   Resp 16   Ht 5' 3.19" (1.605 m)   Wt 229 lb (103.9 kg)   SpO2 95%   BMI 40.32 kg/m  Physical Exam  Constitutional: She is oriented to person, place, and time. She appears well-developed and well-nourished. No distress.  HENT:  Head: Normocephalic and atraumatic.  Right Ear: Hearing, tympanic membrane, external ear and ear canal normal.  Left Ear:  Hearing, tympanic membrane, external ear and ear canal normal.  Nose: Nose normal.  Mouth/Throat: Oropharynx is clear and moist.  Eyes: Conjunctivae and EOM are normal. Pupils are equal, round, and reactive to light.  Neck: Normal range of motion and full passive range of motion without pain. Neck supple. No JVD present. Carotid bruit is not present. No thyromegaly present.  Cardiovascular: Normal rate, regular rhythm, normal heart sounds and intact distal pulses. Exam reveals no gallop and no friction rub.  No murmur heard. Pulmonary/Chest: Effort normal and breath sounds normal. No respiratory distress. She has no wheezes. She has no rales. Right breast exhibits no inverted nipple, no mass, no nipple discharge, no skin change and no tenderness. Left breast exhibits no inverted nipple, no mass, no nipple discharge, no skin change and no tenderness. Breasts are symmetrical.  Abdominal: Soft. Bowel sounds are normal. She exhibits no distension and no mass. There is no tenderness. There is no rebound and no guarding.  Musculoskeletal:       Right shoulder: Normal.       Left shoulder: Normal.       Cervical back: Normal.  Lymphadenopathy:     She has no cervical adenopathy.  Neurological: She is alert and oriented to person, place, and time. She has normal reflexes. No cranial nerve deficit. She exhibits normal muscle tone. Coordination normal.  Skin: Skin is warm and dry. No rash noted. She is not diaphoretic. No erythema. No pallor.  Psychiatric: She has a normal mood and affect. Her behavior is normal. Judgment and thought content normal.  Nursing note and vitals reviewed.  No results found. Depression screen [place] 2/9 05/24/2017 08/08/2016 12/20/2015 08/31/2014 08/04/2013  Decreased Interest 0 0 0 0 1  Down, Depressed, Hopeless 0 0 0 0 0  PHQ - 2 Score 0 0 0 0 1   Fall Risk  05/24/2017 08/08/2016 08/31/2014 08/04/2013  Falls in the past year? No Yes Yes Yes  Number falls in past yr: - 2 or more 1 2 or more  Injury with Fall? - No No -  Risk Factor Category  - High Fall Risk - -        Assessment & Plan:   1. Routine physical examination   2. Aneurysm of ascending aorta (HCC)   3. Essential hypertension   4. Seasonal allergic rhinitis due to pollen   5. Gastroesophageal reflux disease without esophagitis   6. Vitamin D deficiency   7. Screening for diabetes mellitus   8. Colon cancer screening   9. Urge incontinence   10. Class 3 severe obesity due to excess calories with serious comorbidity and body mass index (BMI) of 40.0 to 44.9 in adult [facility])     -anticipatory guidance provided --- exercise, weight loss, safe driving practices, aspirin 81mg  daily. -obtain age appropriate screening labs and labs for chronic disease management. -Urge incontinence: uncontrolled; refer to urology. -refills provided on chronic diseases. -refer for colonoscopy.  -no repeat imaging of aortic aneurysm since 02/2016.  Was to undergo repeat echo in one year per Dr. Oval Linsey.  No upcoming appointments with cardiology; will place order for echo repeat. Recommend weight loss, exercise for 30-60 minutes five days per week; recommend 1200 kcal  restriction per day with a minimum of 60 grams of protein per day.  Eat 3 meals per day. Do not skip meals. Consider having a protein shake as a meal replacement to aid with eliminating meal skipping. Look for products with <220 calories, <7 gm  sugar, and 20-30 gm protein.  Eat breakfast within 2 hours of getting up.   Make  your plate non-starchy vegetables,  protein, and  carbohydrates at lunch and dinner.   Aim for at least 64 oz. of calorie-free beverages daily (water, Crystal Light, diet green tea, etc.). Eliminate any sugary beverages such as regular soda, sweet tea, or fruit juice.   Pay attention to hunger and fullness cues.  Stop eating once you feel satisfied; don't wait until you feel full, stuffed, or sick from eating.  Choose lean meats and low fat/fat free dairy products.  Choose foods high in fiber such as fruits, vegetables, and whole grains (brown rice, whole wheat pasta, whole wheat bread, etc.).  Limit foods with added sugar to <7 gm per serving.  Always eat in the kitchen/dining room.  Never eat in the bedroom or in front of the TV.    Orders Placed This Encounter  Procedures  . CBC with Differential/Platelet  . Comprehensive metabolic panel    Order Specific Question:   Has the patient fasted?    Answer:   No  . Hemoglobin A1c  . Lipid panel    Order Specific Question:   Has the patient fasted?    Answer:   No  . TSH  . Vitamin D, 25-hydroxy  . Ambulatory referral to Gastroenterology    Referral Priority:   Routine    Referral Type:   Consultation    Referral Reason:   Specialty Services Required    Number of Visits Requested:   1  . Ambulatory referral to Urology    Referral Priority:   Routine    Referral Type:   Consultation    Referral Reason:   Specialty Services Required    Requested Specialty:   Urology    Number of Visits Requested:   1  . POCT urinalysis dipstick  . ECHOCARDIOGRAM COMPLETE    Standing Status:   Future    Standing  Expiration Date:   09/25/2018    Order Specific Question:   Where should this test be performed    Answer:   [facility]    Order Specific Question:   Perflutren DEFINITY (image enhancing agent) should be administered unless hypersensitivity or allergy exist    Answer:   Administer Perflutren    Order Specific Question:   Expected Date:    Answer:   1 month   Meds ordered this encounter  Medications  . telmisartan-hydrochlorothiazide (MICARDIS HCT) 80-12.5 MG tablet    Sig: Take 1 tablet by mouth daily.    Dispense:  90 tablet    Refill:  1  . pantoprazole (PROTONIX) 40 MG tablet    Sig: Take 1 tablet (40 mg total) by mouth daily.    Dispense:  90 tablet    Refill:  0  . oxybutynin (DITROPAN-XL) 10 MG 24 hr tablet    Sig: Take 1 tablet (10 mg total) by mouth at bedtime.    Dispense:  90 tablet    Refill:  3    Return in about 6 months (around 11/24/2017) for follow-up chronic medical conditions.   Goldye Tourangeau Elayne Guerin, M.D. Primary Care at [facility] [place] [address] [place], [place]  [address] [phone] phone [phone] fax

## 2017-05-25 LAB — COMPREHENSIVE METABOLIC PANEL
ALT: 13 IU/L (ref 0–32)
AST: 13 IU/L (ref 0–40)
Albumin/Globulin Ratio: 2 (ref 1.2–2.2)
Albumin: 4.5 g/dL (ref 3.5–5.5)
Alkaline Phosphatase: 104 IU/L (ref 39–117)
BUN/Creatinine Ratio: 23 (ref 9–23)
BUN: 22 mg/dL (ref 6–24)
Bilirubin Total: 0.2 mg/dL (ref 0.0–1.2)
CO2: 25 mmol/L (ref 20–29)
Calcium: 9.7 mg/dL (ref 8.7–10.2)
Chloride: 101 mmol/L (ref 96–106)
Creatinine, Ser: 0.96 mg/dL (ref 0.57–1.00)
GFR calc Af Amer: 78 mL/min/{1.73_m2} (ref >59)
GFR calc non Af Amer: 67 mL/min/{1.73_m2} (ref >59)
Globulin, Total: 2.3 g/dL (ref 1.5–4.5)
Glucose: 82 mg/dL (ref 65–99)
Potassium: 3.8 mmol/L (ref 3.5–5.2)
Sodium: 141 mmol/L (ref 134–144)
Total Protein: 6.8 g/dL (ref 6.0–8.5)

## 2017-05-25 LAB — CBC WITH DIFFERENTIAL/PLATELET
Basophils Absolute: 0 10*3/uL (ref 0.0–0.2)
Basos: 1 %
EOS (ABSOLUTE): 0.1 10*3/uL (ref 0.0–0.4)
Eos: 2 %
Hematocrit: 37.4 % (ref 34.0–46.6)
Hemoglobin: 12.7 g/dL (ref 11.1–15.9)
Immature Grans (Abs): 0 10*3/uL (ref 0.0–0.1)
Immature Granulocytes: 0 %
Lymphocytes Absolute: 3 10*3/uL (ref 0.7–3.1)
Lymphs: 46 %
MCH: 28.9 pg (ref 26.6–33.0)
MCHC: 34 g/dL (ref 31.5–35.7)
MCV: 85 fL (ref 79–97)
Monocytes Absolute: 0.4 10*3/uL (ref 0.1–0.9)
Monocytes: 6 %
Neutrophils Absolute: 2.9 10*3/uL (ref 1.4–7.0)
Neutrophils: 45 %
Platelets: 216 10*3/uL (ref 150–379)
RBC: 4.4 x10E6/uL (ref 3.77–5.28)
RDW: 13.5 % (ref 12.3–15.4)
WBC: 6.5 10*3/uL (ref 3.4–10.8)

## 2017-05-25 LAB — VITAMIN D 25 HYDROXY (VIT D DEFICIENCY, FRACTURES): Vit D, 25-Hydroxy: 22.2 ng/mL — ABNORMAL LOW (ref 30.0–100.0)

## 2017-05-25 LAB — LIPID PANEL
CHOLESTEROL TOTAL: 215 mg/dL — AB (ref 100–199)
Chol/HDL Ratio: 2.5 ratio (ref 0.0–4.4)
HDL: 85 mg/dL (ref >39)
LDL Calculated: 115 mg/dL — ABNORMAL HIGH (ref 0–99)
TRIGLYCERIDES: 76 mg/dL (ref 0–149)
VLDL Cholesterol Cal: 15 mg/dL (ref 5–40)

## 2017-05-25 LAB — HEMOGLOBIN A1C
ESTIMATED AVERAGE GLUCOSE: 123 mg/dL
HEMOGLOBIN A1C: 5.9 % — AB (ref 4.8–5.6)

## 2017-05-25 LAB — TSH: TSH: 1.24 u[IU]/mL (ref 0.450–4.500)

## 2017-05-28 ENCOUNTER — Telehealth: Payer: Self-pay

## 2017-05-28 NOTE — Telephone Encounter (Signed)
Received fax from pharmacy. NDC for Protonix prescribed not covered by the insurance. Please advise

## 2017-06-01 MED ORDER — OMEPRAZOLE 20 MG PO CPDR: 20.0000 mg | DELAYED_RELEASE_CAPSULE | Freq: Every day | ORAL | 3 refills | Status: DC

## 2017-06-01 NOTE — Telephone Encounter (Signed)
Sent in Omeprazole to replace Protonix.

## 2017-06-03 NOTE — Telephone Encounter (Signed)
Left detailed message with new prescription

## 2017-06-08 ENCOUNTER — Telehealth: Payer: Self-pay | Admitting: Family Medicine

## 2017-06-08 NOTE — Telephone Encounter (Signed)
Copied from [place] [id number]. Topic: Quick Communication - Rx Refill/Question >> Jun 08, 2017  5:10 PM Cecelia Byars, NT wrote: Medication: pantoprazole (PROTONIX) 40 MG tablet Has the patient contacted their pharmacy? {yes  (Agent: If no, request that the patient contact the pharmacy for the refill.) Preferred Pharmacy (with phone number or street name): [facility] [place] [phone] (Phone) [phone] (Fax Agent: Please be advised that RX refills may take up to 3 business days. We ask that you follow-up with your pharmacy. The above medication is not covered by the patients insurance,and she would like the generic brand

## 2017-06-08 NOTE — Telephone Encounter (Signed)
Left detailed VM advising pt that medication was ordered as generic and that if insurance is still not covering she can get the medication over the counter.

## 2017-07-15 ENCOUNTER — Encounter: Payer: Self-pay | Admitting: Family Medicine

## 2017-08-02 ENCOUNTER — Encounter: Payer: Self-pay | Admitting: Family Medicine

## 2017-08-03 ENCOUNTER — Encounter: Payer: Self-pay | Admitting: Family Medicine

## 2017-08-05 ENCOUNTER — Telehealth: Payer: Self-pay | Admitting: Family Medicine

## 2017-08-05 NOTE — Telephone Encounter (Signed)
I left a voicemail in regards to cancelling/rescheduling her appt with Dr. Tamala Julian on 11/15/2017 due to provider leaving.

## 2017-10-18 ENCOUNTER — Encounter: Payer: Self-pay | Admitting: Family Medicine

## 2017-11-15 ENCOUNTER — Telehealth: Payer: Self-pay | Admitting: Cardiology

## 2017-11-15 ENCOUNTER — Ambulatory Visit: Payer: Managed Care, Other (non HMO) | Admitting: Family Medicine

## 2017-11-15 NOTE — Telephone Encounter (Signed)
   [facility] Pre-operative Risk Assessment    Request for surgical clearance:  1. What type of surgery is being performed? Total Knee Replacement   2. When is this surgery scheduled? TBD   3. What type of clearance is required (medical clearance vs. Pharmacy clearance to hold med vs. Both)? Medical  4. Are there any medications that need to be held prior to surgery and how long?None    5. Practice name and name of physician performing surgery? [facility]  Dr. Remo Lipps Lucey  6. What is your office phone number([phone]) [phone]    7.   What is your office fax number([phone]) [phone]  8.   Anesthesia type (None, local, MAC, general) ? Not specified   Therisa Doyne 11/15/2017, 4:25 PM  _________________________________________________________________   (provider comments below)

## 2017-11-16 NOTE — Telephone Encounter (Signed)
Left detailed message for patient to contact office to schedule follow up appoint,ent for clearance.

## 2017-11-16 NOTE — Telephone Encounter (Signed)
   Primary Cardiologist:Dr Oval Linsey  Chart reviewed as part of pre-operative protocol coverage. Because of Audrey Farley's past medical history and time since last visit, he/she will require a follow-up visit in order to better assess preoperative cardiovascular risk.  Pre-op covering staff: - Please schedule appointment and call patient to inform them. - Please contact requesting surgeon's office via preferred method (i.e, phone, fax) to inform them of need for appointment prior to surgery.  Kerin Ransom, PA-C  11/16/2017, 4:28 PM

## 2017-11-17 NOTE — Telephone Encounter (Signed)
Left message on patients cell phone call work number but was unable to speak with anyone.

## 2017-11-18 NOTE — Telephone Encounter (Signed)
Left detailed message (ok per DPR) on cell to call back to schedule appointment for clearance

## 2017-11-19 ENCOUNTER — Ambulatory Visit (INDEPENDENT_AMBULATORY_CARE_PROVIDER_SITE_OTHER): Payer: Managed Care, Other (non HMO) | Admitting: Urgent Care

## 2017-11-19 ENCOUNTER — Encounter: Payer: Self-pay | Admitting: Urgent Care

## 2017-11-19 VITALS — BP 138/90 | HR 79 | Temp 98.1°F | Resp 16 | Ht 63.0 in | Wt 230.0 lb

## 2017-11-19 DIAGNOSIS — R030 Elevated blood-pressure reading, without diagnosis of hypertension: Secondary | ICD-10-CM | POA: Diagnosis not present

## 2017-11-19 DIAGNOSIS — M1711 Unilateral primary osteoarthritis, right knee: Secondary | ICD-10-CM

## 2017-11-19 DIAGNOSIS — Z01818 Encounter for other preprocedural examination: Secondary | ICD-10-CM | POA: Diagnosis not present

## 2017-11-19 DIAGNOSIS — I712 Thoracic aortic aneurysm, without rupture: Secondary | ICD-10-CM

## 2017-11-19 DIAGNOSIS — I10 Essential (primary) hypertension: Secondary | ICD-10-CM | POA: Diagnosis not present

## 2017-11-19 DIAGNOSIS — I7121 Aneurysm of the ascending aorta, without rupture: Secondary | ICD-10-CM

## 2017-11-19 MED ORDER — AMLODIPINE BESYLATE 2.5 MG PO TABS: 2.5000 mg | ORAL_TABLET | Freq: Every day | ORAL | 3 refills | Status: DC

## 2017-11-19 NOTE — Telephone Encounter (Signed)
Called patient, advised her to call back to schedule appointment for clearance. Left call back number.

## 2017-11-19 NOTE — Patient Instructions (Addendum)
Please check your blood pressure once weekly. Try to check the blood pressure around the same time each day that you check it after a period of resting in a seated position for 5-10 minutes. The readings should be between 371-062 systolic (top number), between 69-48 diastolic (bottom number). Avoid salt in your foods; do not eat any restaurant foods. Frozen meals, pre-seasoned meats and foods are not good options in terms of salt and your blood pressure. Use salt free seasonings such as Mrs. Dash.     Amlodipine tablets What is this medicine? AMLODIPINE (am LOE di peen) is a calcium-channel blocker. It affects the amount of calcium found in your heart and muscle cells. This relaxes your blood vessels, which can reduce the amount of work the heart has to do. This medicine is used to lower high blood pressure. It is also used to prevent chest pain. This medicine may be used for other purposes; ask your health care provider or pharmacist if you have questions. COMMON BRAND NAME(S): Norvasc What should I tell my health care provider before I take this medicine? They need to know if you have any of these conditions: -heart problems like heart failure or aortic stenosis -liver disease -an unusual or allergic reaction to amlodipine, other medicines, foods, dyes, or preservatives -pregnant or trying to get pregnant -breast-feeding How should I use this medicine? Take this medicine by mouth with a glass of water. Follow the directions on the prescription label. Take your medicine at regular intervals. Do not take more medicine than directed. Talk to your pediatrician regarding the use of this medicine in children. Special care may be needed. This medicine has been used in children as young as 6. Persons over 28 years old may have a stronger reaction to this medicine and need smaller doses. Overdosage: If you think you have taken too much of this medicine contact a poison control center or emergency room  at once. NOTE: This medicine is only for you. Do not share this medicine with others. What if I miss a dose? If you miss a dose, take it as soon as you can. If it is almost time for your next dose, take only that dose. Do not take double or extra doses. What may interact with this medicine? -herbal or dietary supplements -local or general anesthetics -medicines for high blood pressure -medicines for prostate problems -rifampin This list may not describe all possible interactions. Give your health care provider a list of all the medicines, herbs, non-prescription drugs, or dietary supplements you use. Also tell them if you smoke, drink alcohol, or use illegal drugs. Some items may interact with your medicine. What should I watch for while using this medicine? Visit your doctor or health care professional for regular check ups. Check your blood pressure and pulse rate regularly. Ask your health care professional what your blood pressure and pulse rate should be, and when you should contact him or her. This medicine may make you feel confused, dizzy or lightheaded. Do not drive, use machinery, or do anything that needs mental alertness until you know how this medicine affects you. To reduce the risk of dizzy or fainting spells, do not sit or stand up quickly, especially if you are an older patient. Avoid alcoholic drinks; they can make you more dizzy. Do not suddenly stop taking amlodipine. Ask your doctor or health care professional how you can gradually reduce the dose. What side effects may I notice from receiving this medicine? Side effects that  you should report to your doctor or health care professional as soon as possible: -allergic reactions like skin rash, itching or hives, swelling of the face, lips, or tongue -breathing problems -changes in vision or hearing -chest pain -fast, irregular heartbeat -swelling of legs or ankles Side effects that usually do not require medical attention  (report to your doctor or health care professional if they continue or are bothersome): -dry mouth -facial flushing -nausea, vomiting -stomach gas, pain -tired, weak -trouble sleeping This list may not describe all possible side effects. Call your doctor for medical advice about side effects. You may report side effects to FDA at 1-800-FDA-1088. Where should I keep my medicine? Keep out of the reach of children. Store at room temperature between 59 and 86 degrees F (15 and 30 degrees C). Protect from light. Keep container tightly closed. Throw away any unused medicine after the expiration date. NOTE: This sheet is a summary. It may not cover all possible information. If you have questions about this medicine, talk to your doctor, pharmacist, or health care provider.  2018 Elsevier/Gold Standard (2012-01-22 11:40:58)     Thoracic Aortic Aneurysm An aneurysm is a bulge in an artery. It happens when blood pushes up against a weakened or damaged artery wall. A thoracic aortic aneurysm is an aneurysm that occurs in the first part of the aorta, between the heart and the diaphragm. The aorta is the main artery of the body. It supplies blood from the heart to the rest of the body. Some aneurysms may not cause symptoms or problems. However, the major concern with a thoracic aortic aneurysm is that it can enlarge and burst (rupture), or blood can flow between the layers of the wall of the aorta through a tear (aorticdissection). Both of these conditions can cause bleeding inside the body and can be life-threatening if they are not diagnosed and treated right away. What are the causes? The exact cause of this condition is not known. What increases the risk? The following factors may make you more likely to develop this condition:  Being age 55 or older.  Having a hardening of the arteries caused by the buildup of fat and other substances in the lining of a blood vessel (arteriosclerosis).  Having  inflammation of the walls of an artery (arteritis).  Having a genetic disease that weakens the body's connective tissue, such as Marfan syndrome.  Having an injury or trauma to the aorta.  Having an infection that is caused by bacteria, such as syphilis or staphylococcus, in the wall of the aorta (infectious aortitis).  Having high blood pressure (hypertension).  Being female.  Being white (Caucasian).  Having high cholesterol.  Having a family history of aneurysms.  Using tobacco.  Having chronic obstructive pulmonary disease (COPD).  What are the signs or symptoms? Symptoms of this condition vary depending on the size and rate of growth of the aneurysm. Most grow slowly and do not cause any symptoms. When symptoms do occur, they may include:  Pain in the chest, back, sides, or abdomen. The pain may vary in intensity. A sudden onset of severe pain may indicate that the aneurysm has ruptured.  Hoarseness.  Cough.  Shortness of breath.  Swallowing problems.  Swelling in the face, arms, or legs.  Fever.  Unexplained weight loss.  How is this diagnosed? This condition may be diagnosed with:  An ultrasound.  X-rays.  A CT scan.  An MRI.  Tests to check the arteries for damage or blockages (  angiogram).  Most unruptured thoracic aortic aneurysms cause no symptoms, so they are often found during exams for other conditions. How is this treated? Treatment for this condition depends on:  The size of the aneurysm.  How fast the aneurysm is growing.  Your age.  Risk factors for rupture.  Aneurysms that are smaller than 2.2 inches (5.5 cm) may be managed by using medicines to control blood pressure, manage pain, or fight infection. You may need regular monitoring to see if the aneurysm is getting bigger. Your health care provider may recommend that you have an ultrasound every year or every 6 months. How often you need to have an ultrasound depends on the size of  the aneurysm, how fast it is growing, and whether you have a family history of aneurysms. Surgical repair may be needed if your aneurysm is larger than 2.2 inches or if it is growing quickly. Follow these instructions at home: Eating and drinking  Eat a healthy diet. Your health care provider may recommend that you: ? Lower your salt (sodium) intake. In some people, too much salt can raise blood pressure and increase the risk of thoracic aortic aneurysm. ? Avoid foods that are high in saturated fat and cholesterol, such as red meat and dairy. ? Eat a diet that is low in sugar. ? Increase your fiber intake by including whole grains, vegetables, and fruits in your diet. Eating these foods may help to lower blood pressure.  Limit or avoid alcohol as recommended by your health care provider. Lifestyle  Follow instructions from your health care provider about healthy lifestyle habits. Your health care provider may recommend that you: ? Do not use any products that contain nicotine or tobacco, such as cigarettes and e-cigarettes. If you need help quitting, ask your health care provider. ? Keep your blood pressure within normal limits. The target limit for most people is below 120/80. Check your blood pressure regularly. If it is high, ask your health care provider about ways that you can control it. ? Keep your blood sugar (glucose) level and cholesterol levels within normal limits. Target limits for most people are:  Blood glucose level: Less than 100 mg/dL.  Total cholesterol level: Less than 200 mg/dL. ? Maintain a healthy weight. Activity  Stay physically active and exercise regularly. Talk with your health care provider about how often you should exercise and ask which types of exercise are safe for you.  Avoid heavy lifting and activities that take a lot of effort (are strenuous). Ask your health care provider what activities are safe for you. General instructions  Keep all follow-up  visits as told by your health care provider. This is important. ? Talk with your health care provider about regular screenings to see if the aneurysm is getting bigger.  Take over-the-counter and prescription medicines only as told by your health care provider. Contact a health care provider if:  You have discomfort in your upper back, neck, or abdomen.  You have trouble swallowing.  You have a cough or hoarseness.  You have a family history of aneurysms.  You have unexplained weight loss. Get help right away if:  You have sudden, severe pain in your upper back and abdomen. This pain may move into your chest and arms.  You have shortness of breath.  You have a fever. This information is not intended to replace advice given to you by your health care provider. Make sure you discuss any questions you have with your health care  provider. Document Released: 02/23/2005 Document Revised: 12/06/2015 Document Reviewed: 12/06/2015 Elsevier Interactive Patient Education  [employer].     If you have lab work done today you will be contacted with your lab results within the next 2 weeks.  If you have not heard from [place] then please contact us. The fastest way to get your results is to register for My Chart.   IF you received an x-ray today, you will receive an invoice from [facility]. Please contact [facility] at [phone] with questions or concerns regarding your invoice.   IF you received labwork today, you will receive an invoice from [place]. Please contact [facility] at [phone] with questions or concerns regarding your invoice.   Our billing staff will not be able to assist you with questions regarding bills from these companies.  You will be contacted with the lab results as soon as they are available. The fastest way to get your results is to activate your My Chart account. Instructions are located on the last page of this paperwork. If you have not  heard from [place] regarding the results in 2 weeks, please contact this office.

## 2017-11-19 NOTE — Progress Notes (Signed)
    MRN: [id number] DOB: [birth year]  Subjective:   Audrey Farley is a 57 y.o. female presenting for surgical clearance for right knee replacement. Patient has HTN, takes Micardis for this. She also has a cardiologist, Dr. Peter Martinique. They have requested an office visit for surgical clearance from a cardiac standpoint.  Patient states that she only takes Micardis and has had a very difficult time tolerating any other blood pressure medications.  States that she tries to eat a low-salt diet. Denies headaches, confusion, dizziness, chest pain, heart racing, palpitations. Is exposed to second hand smoke through her job.   Audrey Farley has a current medication list which includes the following prescription(s): telmisartan-hydrochlorothiazide. Also is allergic to no known allergies.  Audrey Farley  has a past medical history of Allergy, Aneurysm of ascending aorta ([place]) (02/25/2016), Arthritis, Chest pain (01/24/2016), Dyspnea on exertion, Hypertension, and Urge incontinence. 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  Objective:   Vitals: BP 138/90   Pulse 79   Temp 98.1 F (36.7 C) (Oral)   Resp 16   Ht 5\' 3"  (1.6 m)   Wt 230 lb (104.3 kg)   SpO2 100%   BMI 40.74 kg/m   BP Readings from Last 3 Encounters:  11/19/17 138/90  05/24/17 138/82  09/29/16 120/70    Physical Exam  Constitutional: She is oriented to person, place, and time. She appears well-developed and well-nourished.  Cardiovascular: Normal rate, regular rhythm, normal heart sounds and intact distal pulses. Exam reveals no gallop and no friction rub.  No murmur heard. Pulmonary/Chest: Effort normal and breath sounds normal. No stridor. No respiratory distress. She has no wheezes. She has no rales.  Neurological: She is alert and oriented to person, place, and time.   Assessment  and Plan :   Pre-operative clearance  Osteoarthritis of right knee, unspecified osteoarthritis type  Essential hypertension  Elevated blood pressure reading  Aneurysm of ascending aorta (HCC)  Patient cleared medically for her knee replacement.  Counseled that she needs strict hypertension management and control due to her concomitant aneurysm.  I advised that she maintain her appointment with her cardiologist to get cardiac clearance for her surgery.  In the meantime patient is going to start amlodipine in addition to my Micardis at 2.5 mg once daily for an effort at strict hypertensive control.  Jaynee Eagles, PA-C [facility] [place] [phone] 11/19/2017  8:22 AM

## 2017-11-22 NOTE — Telephone Encounter (Signed)
Pt scheduled to see Dr. Oval Linsey 11/26/17.

## 2017-11-26 ENCOUNTER — Ambulatory Visit (INDEPENDENT_AMBULATORY_CARE_PROVIDER_SITE_OTHER): Payer: Managed Care, Other (non HMO) | Admitting: Cardiovascular Disease

## 2017-11-26 ENCOUNTER — Encounter: Payer: Self-pay | Admitting: Cardiovascular Disease

## 2017-11-26 ENCOUNTER — Encounter: Payer: Self-pay | Admitting: *Deleted

## 2017-11-26 VITALS — BP 118/72 | HR 55 | Ht 63.5 in | Wt 225.8 lb

## 2017-11-26 DIAGNOSIS — Z01818 Encounter for other preprocedural examination: Secondary | ICD-10-CM

## 2017-11-26 DIAGNOSIS — I712 Thoracic aortic aneurysm, without rupture: Secondary | ICD-10-CM

## 2017-11-26 DIAGNOSIS — I10 Essential (primary) hypertension: Secondary | ICD-10-CM | POA: Diagnosis not present

## 2017-11-26 DIAGNOSIS — I7121 Aneurysm of the ascending aorta, without rupture: Secondary | ICD-10-CM

## 2017-11-26 NOTE — Progress Notes (Signed)
Cardiology Office Note   Date:  11/26/2017   ID:  Audrey Farley, DOB Sep 30, 1960, MRN [id number]  PCP:  Jaynee Eagles, PA-C  Cardiologist:   Skeet Latch, MD   No chief complaint on file.    History of Present Illness: Audrey Farley is a 57 y.o. female with hypertension who presents for follow up.  She saw Dr. Reginia Forts on 12/2015 and reported chest pain and shortness of breath.  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  She was referred for an exercise Myoview that revealed LVEF 53% with no ischemia.  She achieved 8 METS on the Bruce protocol.  She also had an echo 02/2016 that showed LVEF 55-60% and mild dilation of the ascending aorta (3.9 cm).  At her last appointment she was doing well.  Since then she underwent successful L knee replacement and now needs her R knee replaced.  Other than her knee she has been feeling well.  She hasn't been exercising due to knee pain.  She gets a little short of breath with exertion but attributes that to not exercising and weight.  She has no exertional chest pain.  She had one episode of chest pain 6 months ago that occurred when lying in bed.  It was sharp pain and not associated with shortness of breath, nausea or diaphoresis.  It has improved with Tums.  She denies any recurrent chest pain.  She is able to walk up the flight of her stairs but is limited by knee pain.  She can also walk >4 blocks.  She has swelling in her R leg at times that she associates with her knee.  Her PCP started amlodipine last week b/c her BP has been running high.   Past Medical History:  Diagnosis Date  . Allergy   . Aneurysm of ascending aorta ([place]) 02/25/2016   3.9cm by echo 02/2016  -aorta enlarged  . Arthritis   . Chest pain 01/24/2016  . Dyspnea on exertion   . Hypertension   . Urge incontinence     Past Surgical History:  Procedure Laterality Date  . ABDOMINAL HYSTERECTOMY     still has cervix; DUB; ovaries intact.  [facility] KNEE  ARTHROSCOPY Left 2012  . 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 TOTAL KNEE ARTHROPLASTY Left 09/28/2016   Procedure: LEFT TOTAL KNEE ARTHROPLASTY;  Surgeon: Vickey Huger, MD;  Location: [facility];  Service: Orthopedics;  Laterality: Left;     Current Outpatient Medications  Medication Sig Dispense Refill  . amLODipine (NORVASC) 2.5 MG tablet Take 1 tablet (2.5 mg total) by mouth daily. 90 tablet 3  . telmisartan-hydrochlorothiazide (MICARDIS HCT) 80-12.5 MG tablet Take 1 tablet by mouth daily. 90 tablet 1   No current facility-administered medications for this visit.     Allergies:   No known allergies    Social History:  The patient  reports that she has never smoked. She has never used smokeless tobacco. She reports that she drinks alcohol. She reports that she does not use drugs.   Family History:  The patient's family history includes Diabetes in her mother and sister; Epilepsy in her brother; Heart attack in her mother; Heart disease in her mother; Hyperlipidemia in her mother and sister; Hypertension in her mother and sister; Kidney disease in her mother; Mental illness in her brother; Multiple sclerosis in her sister.    ROS:  Please see the history of present illness.   Otherwise, review of systems are positive for  none.   All other systems are reviewed and negative.    PHYSICAL EXAM: VS:  BP 118/72   Pulse (!) 55   Ht 5' 3.5" (1.613 m)   Wt 225 lb 12.8 oz (102.4 kg)   BMI 39.37 kg/m  , BMI Body mass index is 39.37 kg/m. GENERAL:  Well appearing HEENT: Pupils equal round and reactive, fundi not visualized, oral mucosa unremarkable NECK:  No jugular venous distention, waveform within normal limits, carotid upstroke brisk and symmetric, no bruits LUNGS:  Clear to auscultation bilaterally HEART:  RRR.  PMI not displaced or sustained,S1 and S2 within normal limits, no S3, no S4, no clicks, no rubs, no murmurs ABD:  Flat, positive bowel sounds normal in frequency in pitch, no  bruits, no rebound, no guarding, no midline pulsatile mass, no hepatomegaly, no splenomegaly EXT:  2 plus pulses throughout, no edema, no cyanosis no clubbing SKIN:  No rashes no nodules NEURO:  Cranial nerves II through XII grossly intact, motor grossly intact throughout PSYCH:  Cognitively intact, oriented to person place and time   EKG:  EKG is ordered today. The ekg ordered 01/24/16 demonstrates sinus bradycardia.  Non-specific T wave abnormalities.  11/26/17: Sinus bradycardia.  Rate 56 bpm.  Prior inferior infarct  Exercise Myoview 02/14/16: The left ventricular ejection fraction is mildly decreased (45-54%).  Nuclear stress EF: 53%.  Blood pressure demonstrated a hypertensive response to exercise.  Baseline EKG showed NSR with diffuse ST/T wave abnormality in the inferolateral leads. During stress there was 65mm J point depression from baseline with horizontal ST segment depression in the inferolateral leads. EKG nondiagnostic due to resting EKG changes.  The study is normal.  This is a low risk study.  Echo 02/21/16: Study Conclusions  - Left ventricle: The cavity size was normal. There was mild   concentric hypertrophy. Systolic function was normal. The   estimated ejection fraction was in the range of 55% to 60%. Wall   motion was normal; there were no regional wall motion   abnormalities. - Aortic valve: Trileaflet; normal thickness, mildly calcified   leaflets. - Aorta: Ascending aortic diameter: 39 mm (S). - Aortic root: The aortic root was normal in size. - Ascending aorta: The ascending aorta was mildly dilated. - Mitral valve: There was trivial regurgitation. - Tricuspid valve: There was trivial regurgitation.   Recent Labs: 05/24/2017: ALT 13; BUN 22; Creatinine, Ser 0.96; Hemoglobin 12.7; Platelets 216; Potassium 3.8; Sodium 141; TSH 1.240    Lipid Panel    Component Value Date/Time   CHOL 215 (H) 05/24/2017 1731   TRIG 76 05/24/2017 1731   HDL 85  05/24/2017 1731   CHOLHDL 2.5 05/24/2017 1731   CHOLHDL 2.1 12/20/2015 1123   VLDL 9 12/20/2015 1123   LDLCALC 115 (H) 05/24/2017 1731      Wt Readings from Last 3 Encounters:  11/26/17 225 lb 12.8 oz (102.4 kg)  11/19/17 230 lb (104.3 kg)  05/24/17 229 lb (103.9 kg)      ASSESSMENT AND PLAN:  # Pre-surgical risk assessment: The patient does not have any unstable cardiac conditions.  Upon evaluation today, she can achieve 4 METs or greater without anginal symptoms.  According to [facility] and AHA guidelines, she requires no further cardiac workup prior to her noncardiac surgery and should be at acceptable risk.  her NSQIP risk of peri-procedural MI or cardiac arrest is 0.1%.  Our service is available as necessary in the perioperative period.  # Atypical chest pain: # GERD:  One episode of chest pain that was alleviated with Tums.  She has no exertional symptoms.  Exercise myoview was negative 02/2016.  No ischemia evaluation needed.   # Ascending aorta aneurysm: Echo showed that her ascending aorta is mildly enlarged (3.9cm).   Repeat echo.  # Hypertension: Blood pressure was initially elevated but better on repeat.  Continue telmisartan/HCTZ and amlodipine.    # CV Disease Risk: ASCVD 10 year risk is 2.7%. Therefore we will not start aspirin or statin at this time.    Current medicines are reviewed at length with the patient today.  The patient does not have concerns regarding medicines.  The following changes have been made:  no change  Labs/ tests ordered today include:   Orders Placed This Encounter  Procedures  . EKG 12-Lead  . ECHOCARDIOGRAM COMPLETE     Disposition:   FU with Crystallee Werden C. Oval Linsey, MD, [facility] in 1 year.     Signed, Winfred Redel C. Oval Linsey, MD, [facility]  11/26/2017 7:30 PM    [place]

## 2017-11-26 NOTE — Patient Instructions (Signed)
Medication Instructions:  Your physician recommends that you continue on your current medications as directed. Please refer to the Current Medication list given to you today.  Labwork: NONE  Testing/Procedures: Your physician has requested that you have an echocardiogram. Echocardiography is a painless test that uses sound waves to create images of your heart. It provides your doctor with information about the size and shape of your heart and how well your heart's chambers and valves are working. This procedure takes approximately one hour. There are no restrictions for this procedure. [place]  Follow-Up: Your physician wants you to follow-up in: [place] will receive a reminder letter in the mail two months in advance. If you don't receive a letter, please call our office to schedule the follow-up appointment.  Any Other Special Instructions Will Be Listed Below (If Applicable).  YOU ARE LOW RISK FOR SURGERY   If you need a refill on your cardiac medications before your next appointment, please call your pharmacy.

## 2017-12-03 ENCOUNTER — Encounter: Payer: Self-pay | Admitting: Family Medicine

## 2017-12-03 ENCOUNTER — Other Ambulatory Visit: Payer: Self-pay

## 2017-12-03 ENCOUNTER — Ambulatory Visit (INDEPENDENT_AMBULATORY_CARE_PROVIDER_SITE_OTHER): Payer: Managed Care, Other (non HMO) | Admitting: Family Medicine

## 2017-12-03 VITALS — BP 128/68 | HR 63 | Temp 98.0°F | Ht 63.5 in | Wt 226.2 lb

## 2017-12-03 DIAGNOSIS — I10 Essential (primary) hypertension: Secondary | ICD-10-CM | POA: Diagnosis not present

## 2017-12-03 DIAGNOSIS — Z130 Encounter for screening for diseases of the blood and blood-forming organs and certain disorders involving the immune mechanism: Secondary | ICD-10-CM

## 2017-12-03 DIAGNOSIS — R7303 Prediabetes: Secondary | ICD-10-CM

## 2017-12-03 DIAGNOSIS — Z131 Encounter for screening for diabetes mellitus: Secondary | ICD-10-CM

## 2017-12-03 NOTE — Patient Instructions (Addendum)
  I will check some screening blood work as well as prediabetes test. Once we have those labs back, can send to Dr. Ronnie Derby.   Blood pressure looks better today new regimen.  Continue to watch diet for prediabetes but I will recheck that level today.  Thank you for coming in today.   If you have lab work done today you will be contacted with your lab results within the next 2 weeks.  If you have not heard from [place] then please contact us. The fastest way to get your results is to register for My Chart.   IF you received an x-ray today, you will receive an invoice from [facility]. Please contact [facility] at [phone] with questions or concerns regarding your invoice.   IF you received labwork today, you will receive an invoice from [place]. Please contact [facility] at [phone] with questions or concerns regarding your invoice.   Our billing staff will not be able to assist you with questions regarding bills from these companies.  You will be contacted with the lab results as soon as they are available. The fastest way to get your results is to activate your My Chart account. Instructions are located on the last page of this paperwork. If you have not heard from [place] regarding the results in 2 weeks, please contact this office.

## 2017-12-03 NOTE — Progress Notes (Signed)
Subjective:  By signing my name below, I, Moises Blood, attest that this documentation has been prepared under the direction and in the presence of Merri Ray, MD. Electronically Signed: Moises Blood, [place]. 12/03/2017 , 5:46 PM .  Patient was seen in Room 14 .   Patient ID: Audrey Farley, female    DOB: [birth year], 57 y.o.   MRN: [id number] Chief Complaint  Patient presents with  . Right knee replacement    Pre-Opt Sugery Clearance Oct 14 sugery    HPI Audrey Farley is a 57 y.o. female  Here for blood work for pre-opt surgical clearance. She was seen on Sept 13th by Jaynee Eagles for right knee placement pre-opt clearance. She ultimately did get cardiac clearance due to history of ascending aorta. Amlodipine 2.5 mg was added to Micardis for improved BP control. She does not requires any further cardiac work up, detected to be at acceptable risk. She requests for CMP, A1C and CBC testing today. She denies history of anemia, or dark tarry stools. She denies any complications with amlodipine.   She had left knee total replacement done by Dr. Ronnie Derby in July 2018; denies any complications at that time. Her right knee total replacement is scheduled for Oct 14th, also will be done by Dr. Ronnie Derby.   She has a 55 year old, 55 year old and a 60 year old daughter who attends [employer]. She will become a grandmother soon.   Pre-diabetes Lab Results  Component Value Date   GLUCOSE 82 05/24/2017   GLUCOSE 155 (H) 09/29/2016   Her glucose was elevated at 155 in July 2018 with elevated kidney tests. Her glucose was normal at 82 on March 2019.   Lab Results  Component Value Date   HGBA1C 5.9 (H) 05/24/2017   Wt Readings from Last 3 Encounters:  12/03/17 226 lb 3.2 oz (102.6 kg)  11/26/17 225 lb 12.8 oz (102.4 kg)  11/19/17 230 lb (104.3 kg)   Her weight was 229 lbs in March 2019. She mentions having some urinary symptoms, but is followed by urology.   Patient Active Problem  List   Diagnosis Date Noted  . S/P total knee replacement 09/28/2016  . Primary osteoarthritis of left knee 08/19/2016  . Vitamin D deficiency 08/08/2016  . Aneurysm of ascending aorta (HCC) 02/25/2016  . GERD (gastroesophageal reflux disease)   . Allergic rhinitis   . Allergy   . Urge incontinence   . HTN (hypertension) 04/15/2012   Past Medical History:  Diagnosis Date  . Allergy   . Aneurysm of ascending aorta ([place]) 02/25/2016   3.9cm by echo 02/2016  -aorta enlarged  . Arthritis   . Chest pain 01/24/2016  . Dyspnea on exertion   . Hypertension   . Urge incontinence    Past Surgical History:  Procedure Laterality Date  . ABDOMINAL HYSTERECTOMY     still has cervix; DUB; ovaries intact.  Marland Kitchen KNEE ARTHROSCOPY Left 2012  . 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 TOTAL KNEE ARTHROPLASTY Left 09/28/2016   Procedure: LEFT TOTAL KNEE ARTHROPLASTY;  Surgeon: Vickey Huger, MD;  Location: [facility];  Service: Orthopedics;  Laterality: Left;   Allergies  Allergen Reactions  . No Known Allergies    Prior to Admission medications   Medication Sig Start Date End Date Taking? Authorizing Provider  amLODipine (NORVASC) 2.5 MG tablet Take 1 tablet (2.5 mg total) by mouth daily. 11/19/17   Jaynee Eagles, PA-C  telmisartan-hydrochlorothiazide (MICARDIS HCT) 80-12.5 MG tablet Take 1  tablet by mouth daily. 05/24/17   Wardell Honour, MD   Social History   Socioeconomic History  . Marital status: Single    Spouse name: Not on file  . Number of children: Not on file  . Years of education: Not on file  . Highest education level: Not on file  Occupational History  . Not on file  Social Needs  . Financial resource strain: Not on file  . Food insecurity:    Worry: Not on file    Inability: Not on file  . Transportation needs:    Medical: Not on file    Non-medical: Not on file  Tobacco Use  . Smoking status: Never Smoker  . Smokeless tobacco: Never Used  Substance and Sexual Activity    . Alcohol use: Yes    Comment: occ vodka  . Drug use: No  . Sexual activity: Yes    Birth control/protection: Surgical, Post-menopausal  Lifestyle  . Physical activity:    Days per week: Not on file    Minutes per session: Not on file  . Stress: Not on file  Relationships  . Social connections:    Talks on phone: Not on file    Gets together: Not on file    Attends religious service: Not on file    Active member of club or organization: Not on file    Attends meetings of clubs or organizations: Not on file    Relationship status: Not on file  . Intimate partner violence:    Fear of current or ex partner: Not on file    Emotionally abused: Not on file    Physically abused: Not on file    Forced sexual activity: Not on file  Other Topics Concern  . Not on file  Social History Narrative   Marital status: single; not dating since 2014      Children: 2 daughters (16, 50); no grandchildren      Lives: with 2 daughters       Employment: tobacco company; Stage manager x 16 years; loves job!  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        Tobacco: none      Alcohol; weekends/socially      Drugs: none      Exercise:  Two days per week; cardio      Seatbelt: 100%; no texting      Sexual activity: not sexually active since 2014; dates males; no STDs; total partners = 7.   Review of Systems  Constitutional: Negative for fatigue and unexpected weight change.  Respiratory: Negative for chest tightness and shortness of breath.   Cardiovascular: Negative for chest pain, palpitations and leg swelling.  Gastrointestinal: Negative for abdominal pain and blood in stool.  Neurological: Negative for dizziness, syncope, light-headedness and headaches.       Objective:   Physical Exam  Constitutional: She is oriented to person, place, and time. She appears well-developed and well-nourished.  HENT:  Head: Normocephalic and atraumatic.  Eyes: Pupils are equal,  round, and reactive to light. Conjunctivae and EOM are normal.  Neck: Carotid bruit is not present.  Cardiovascular: Normal rate, regular rhythm, normal heart sounds and intact distal pulses.  Pulmonary/Chest: Effort normal and breath sounds normal.  Abdominal: Soft. She exhibits no pulsatile midline mass. There is no tenderness.  Neurological: She is alert and oriented to person, place, and time.  Skin: Skin is warm and dry.  Psychiatric: She has a normal  mood and affect. Her behavior is normal.  Vitals reviewed.   Vitals:   12/03/17 1700 12/03/17 1714  BP: (!) 144/78 128/68  Pulse: 63   Temp: 98 F (36.7 C)   TempSrc: Oral   SpO2: 98%   Weight: 226 lb 3.2 oz (102.6 kg)   Height: 5' 3.5" (1.613 m)        Assessment & Plan:  Audrey Farley is a 57 y.o. female Prediabetes - Plan: Hemoglobin A1c Screening for diabetes mellitus - Plan: Hemoglobin A1c, Comprehensive metabolic panel  -Previous prediabetes, exercise limited by mood issues with upcoming surgery anticipate that should improve ability to exercise.  Commended on diet changes and has had some weight loss  -Repeat A1c, CMP and recheck in the next 6 months  Screening, anemia, deficiency, iron - Plan: CBC  -Screen for upcoming surgery, labs to Dr. Ronnie Derby.  Essential hypertension - Plan: Comprehensive metabolic panel  -Improved with addition of amlodipine, tolerating current regimen.  Check CMP, and labs to surgeon for preop requirements  No orders of the defined types were placed in this encounter.  Patient Instructions    I will check some screening blood work as well as prediabetes test. Once we have those labs back, can send to Dr. Ronnie Derby.   Blood pressure looks better today new regimen.  Continue to watch diet for prediabetes but I will recheck that level today.  Thank you for coming in today.   If you have lab work done today you will be contacted with your lab results within the next 2 weeks.  If you have  not heard from [place] then please contact us. The fastest way to get your results is to register for My Chart.   IF you received an x-ray today, you will receive an invoice from [facility]. Please contact [facility] at [phone] with questions or concerns regarding your invoice.   IF you received labwork today, you will receive an invoice from [place]. Please contact [facility] at [phone] with questions or concerns regarding your invoice.   Our billing staff will not be able to assist you with questions regarding bills from these companies.  You will be contacted with the lab results as soon as they are available. The fastest way to get your results is to activate your My Chart account. Instructions are located on the last page of this paperwork. If you have not heard from [place] regarding the results in 2 weeks, please contact this office.       I personally performed the services described in this documentation, which was scribed in my presence. The recorded information has been reviewed and considered for accuracy and completeness, addended by me as needed, and agree with information above.  Signed,   Merri Ray, MD [facility] [place].  12/04/17 8:27 AM

## 2017-12-04 ENCOUNTER — Encounter: Payer: Self-pay | Admitting: Family Medicine

## 2017-12-04 LAB — HEMOGLOBIN A1C
Est. average glucose Bld gHb Est-mCnc: 120 mg/dL
Hgb A1c MFr Bld: 5.8 % — ABNORMAL HIGH (ref 4.8–5.6)

## 2017-12-04 LAB — CBC
Hematocrit: 36.7 % (ref 34.0–46.6)
Hemoglobin: 12.2 g/dL (ref 11.1–15.9)
MCH: 28.2 pg (ref 26.6–33.0)
MCHC: 33.2 g/dL (ref 31.5–35.7)
MCV: 85 fL (ref 79–97)
Platelets: 213 10*3/uL (ref 150–450)
RBC: 4.33 x10E6/uL (ref 3.77–5.28)
RDW: 12.6 % (ref 12.3–15.4)
WBC: 6.9 10*3/uL (ref 3.4–10.8)

## 2017-12-04 LAB — COMPREHENSIVE METABOLIC PANEL
A/G RATIO: 1.8 (ref 1.2–2.2)
ALBUMIN: 4.6 g/dL (ref 3.5–5.5)
ALT: 16 IU/L (ref 0–32)
AST: 15 IU/L (ref 0–40)
Alkaline Phosphatase: 99 IU/L (ref 39–117)
BUN/Creatinine Ratio: 27 — ABNORMAL HIGH (ref 9–23)
BUN: 26 mg/dL — ABNORMAL HIGH (ref 6–24)
Bilirubin Total: 0.3 mg/dL (ref 0.0–1.2)
CALCIUM: 9.8 mg/dL (ref 8.7–10.2)
CO2: 23 mmol/L (ref 20–29)
CREATININE: 0.98 mg/dL (ref 0.57–1.00)
Chloride: 98 mmol/L (ref 96–106)
GFR, EST AFRICAN AMERICAN: 75 mL/min/{1.73_m2} (ref >59)
GFR, EST NON AFRICAN AMERICAN: 65 mL/min/{1.73_m2} (ref >59)
GLOBULIN, TOTAL: 2.5 g/dL (ref 1.5–4.5)
Glucose: 83 mg/dL (ref 65–99)
POTASSIUM: 3.3 mmol/L — AB (ref 3.5–5.2)
SODIUM: 139 mmol/L (ref 134–144)
TOTAL PROTEIN: 7.1 g/dL (ref 6.0–8.5)

## 2017-12-07 ENCOUNTER — Telehealth: Payer: Self-pay | Admitting: *Deleted

## 2017-12-07 DIAGNOSIS — E876 Hypokalemia: Secondary | ICD-10-CM

## 2017-12-07 NOTE — Telephone Encounter (Signed)
Patient called to say that if there is nothing of concern with her blood work please forward results to [facility], Doctor Lerry Paterson office. Any questions call patient  back Ph# [phone]

## 2017-12-07 NOTE — Telephone Encounter (Signed)
Error

## 2017-12-07 NOTE — Telephone Encounter (Signed)
Pt called the office requesting lab results.

## 2017-12-08 NOTE — Telephone Encounter (Signed)
Labs were routed to Dr. Jeoffrey Massed office - They are on Epic.   Message sent to Dr. Carlota Raspberry to advise if pt needs a call on results- labs have not been released yet.

## 2017-12-09 NOTE — Telephone Encounter (Signed)
See lab notes. Lab only order placed for repeat potassium in 1 week.

## 2018-02-05 ENCOUNTER — Other Ambulatory Visit: Payer: Self-pay

## 2018-02-05 MED ORDER — TELMISARTAN-HCTZ 80-12.5 MG PO TABS: 1.0000 | ORAL_TABLET | Freq: Every day | ORAL | 1 refills | Status: DC

## 2018-03-11 ENCOUNTER — Other Ambulatory Visit (HOSPITAL_COMMUNITY): Payer: Managed Care, Other (non HMO)

## 2018-03-11 DIAGNOSIS — R0989 Other specified symptoms and signs involving the circulatory and respiratory systems: Secondary | ICD-10-CM

## 2018-03-16 ENCOUNTER — Encounter: Payer: Self-pay | Admitting: Cardiovascular Disease

## 2018-03-21 ENCOUNTER — Telehealth: Payer: Self-pay

## 2018-03-21 NOTE — Telephone Encounter (Signed)
New messages  Call the patient to set up an echocardiogram, the patient advises that she does not know when she will be able to set up her echo.  Just an FYI. We have made several attempts to contact this patient including sending a letter to schedule or reschedule their echocardiogram. We will be removing the patient from the echo WQ.   Thank you

## 2018-08-11 ENCOUNTER — Other Ambulatory Visit: Payer: Self-pay | Admitting: Family Medicine

## 2018-08-11 ENCOUNTER — Other Ambulatory Visit: Payer: Self-pay

## 2018-08-11 DIAGNOSIS — I10 Essential (primary) hypertension: Secondary | ICD-10-CM

## 2018-08-11 MED ORDER — TELMISARTAN-HCTZ 80-12.5 MG PO TABS: 1.0000 | ORAL_TABLET | Freq: Every day | ORAL | 0 refills | Status: DC

## 2018-08-11 NOTE — Telephone Encounter (Signed)
Spoke with pt, sent in 30 day supply. Pt is awware she needs ov for 90 day and labs. She will be making telemed appt soon

## 2018-08-11 NOTE — Telephone Encounter (Signed)
Requested medication (s) are due for refill today:yes  Requested medication (s) are on the active medication list: yes  Last refill:  02/05/18  Future visit scheduled: No  Notes to clinic:  Needs labs     Requested Prescriptions  Pending Prescriptions Disp Refills   telmisartan-hydrochlorothiazide (MICARDIS HCT) 80-12.5 MG tablet [Pharmacy Med Name: Telmisartan-HCTZ 80-12.5 MG Oral Tablet] 90 tablet 0    Sig: Take 1 tablet by mouth once daily     Cardiovascular: ARB + Diuretic Combos Failed - 08/11/2018 11:01 AM      Failed - K in normal range and within 180 days    Potassium  Date Value Ref Range Status  12/03/2017 3.3 (L) 3.5 - 5.2 mmol/L Final         Failed - Na in normal range and within 180 days    Sodium  Date Value Ref Range Status  12/03/2017 139 134 - 144 mmol/L Final         Failed - Cr in normal range and within 180 days    Creat  Date Value Ref Range Status  12/20/2015 0.73 0.50 - 1.05 mg/dL Final    Comment:      For patients > or = 56 years of age: The upper reference limit for Creatinine is approximately 13% higher for people identified as African-American.      Creatinine, Ser  Date Value Ref Range Status  12/03/2017 0.98 0.57 - 1.00 mg/dL Final         Failed - Ca in normal range and within 180 days    Calcium  Date Value Ref Range Status  12/03/2017 9.8 8.7 - 10.2 mg/dL Final         Failed - Valid encounter within last 6 months    Recent Outpatient Visits          8 months ago Prediabetes   [facility], Ranell Patrick, MD   8 months ago Pre-operative clearance   [facility] at [place], [place]   1 year ago Routine physical examination   [facility] at [facility], Renette Butters, MD   2 years ago Essential hypertension   [facility] at [facility], Renette Butters, MD   2 years ago Routine physical examination   [facility] at [facility], Renette Butters, MD             Passed - Patient is not pregnant      Passed -  Last BP in normal range    BP Readings from Last 1 Encounters:  12/03/17 128/68

## 2018-10-26 ENCOUNTER — Telehealth: Payer: Self-pay | Admitting: Registered Nurse

## 2018-10-26 NOTE — Telephone Encounter (Signed)
Pt would like a curtesy refill for 14 days on her telmisartan-hydrochlorothiazide (MICARDIS HCT) 80-12.5 MG tablet [[id number]] sent to [facility] on [address]. She does have an app with Orland Mustard on 11/08/18. Please advise at [phone]

## 2018-10-31 ENCOUNTER — Other Ambulatory Visit: Payer: Self-pay

## 2018-10-31 DIAGNOSIS — I10 Essential (primary) hypertension: Secondary | ICD-10-CM

## 2018-10-31 MED ORDER — TELMISARTAN-HCTZ 80-12.5 MG PO TABS: 1.0000 | ORAL_TABLET | Freq: Every day | ORAL | 0 refills | Status: DC

## 2018-10-31 NOTE — Telephone Encounter (Signed)
Spoke with pt advised telmisartan-hctz #30 with no refills sent to [facility].  Pt reminded of appt on 9/1/020 at 12:50 pm.  Pt agreeable. Dgaddy, CMA

## 2018-11-08 ENCOUNTER — Encounter: Payer: Managed Care, Other (non HMO) | Admitting: Registered Nurse

## 2018-11-25 ENCOUNTER — Encounter: Payer: Managed Care, Other (non HMO) | Admitting: Registered Nurse

## 2018-11-25 ENCOUNTER — Encounter: Payer: Self-pay | Admitting: Registered Nurse

## 2018-11-25 ENCOUNTER — Ambulatory Visit (INDEPENDENT_AMBULATORY_CARE_PROVIDER_SITE_OTHER): Payer: Managed Care, Other (non HMO) | Admitting: Registered Nurse

## 2018-11-25 ENCOUNTER — Other Ambulatory Visit: Payer: Self-pay

## 2018-11-25 VITALS — BP 142/82 | HR 77 | Temp 98.7°F | Resp 16 | Wt 232.0 lb

## 2018-11-25 DIAGNOSIS — M62838 Other muscle spasm: Secondary | ICD-10-CM

## 2018-11-25 DIAGNOSIS — I10 Essential (primary) hypertension: Secondary | ICD-10-CM | POA: Diagnosis not present

## 2018-11-25 MED ORDER — CYCLOBENZAPRINE HCL 5 MG PO TABS: 5.0000 mg | ORAL_TABLET | Freq: Three times a day (TID) | ORAL | 1 refills | Status: DC | PRN

## 2018-11-25 MED ORDER — MELOXICAM 7.5 MG PO TABS: 7.5000 mg | ORAL_TABLET | Freq: Every day | ORAL | 0 refills | Status: DC

## 2018-11-25 MED ORDER — TELMISARTAN-HCTZ 80-12.5 MG PO TABS: 1.0000 | ORAL_TABLET | Freq: Every day | ORAL | 0 refills | Status: DC

## 2018-11-25 NOTE — Patient Instructions (Addendum)
   If you have lab work done today you will be contacted with your lab results within the next 2 weeks.  If you have not heard from us then please contact us. The fastest way to get your results is to register for My Chart.   IF you received an x-ray today, you will receive an invoice from [facility]. Please contact [facility] at [phone] with questions or concerns regarding your invoice.   IF you received labwork today, you will receive an invoice from [facility]. Please contact [facility] at [phone] with questions or concerns regarding your invoice.   Our billing staff will not be able to assist you with questions regarding bills from these companies.  You will be contacted with the lab results as soon as they are available. The fastest way to get your results is to activate your My Chart account. Instructions are located on the last page of this paperwork. If you have not heard from us regarding the results in 2 weeks, please contact this office.       Hypertension, Adult High blood pressure (hypertension) is when the force of blood pumping through the arteries is too strong. The arteries are the blood vessels that carry blood from the heart throughout the body. Hypertension forces the heart to work harder to pump blood and may cause arteries to become narrow or stiff. Untreated or uncontrolled hypertension can cause a heart attack, heart failure, a stroke, kidney disease, and other problems. A blood pressure reading consists of a higher number over a lower number. Ideally, your blood pressure should be below 120/80. The first ("top") number is called the systolic pressure. It is a measure of the pressure in your arteries as your heart beats. The second ("bottom") number is called the diastolic pressure. It is a measure of the pressure in your arteries as the heart relaxes. What are the causes? The exact cause of this condition is not known. There are some  conditions that result in or are related to high blood pressure. What increases the risk? Some risk factors for high blood pressure are under your control. The following factors may make you more likely to develop this condition:  Smoking.  Having type 2 diabetes mellitus, high cholesterol, or both.  Not getting enough exercise or physical activity.  Being overweight.  Having too much fat, sugar, calories, or salt (sodium) in your diet.  Drinking too much alcohol. Some risk factors for high blood pressure may be difficult or impossible to change. Some of these factors include:  Having chronic kidney disease.  Having a family history of high blood pressure.  Age. Risk increases with age.  Race. You may be at higher risk if you are African American.  Gender. Men are at higher risk than women before age 45. After age 65, women are at higher risk than men.  Having obstructive sleep apnea.  Stress. What are the signs or symptoms? High blood pressure may not cause symptoms. Very high blood pressure (hypertensive crisis) may cause:  Headache.  Anxiety.  Shortness of breath.  Nosebleed.  Nausea and vomiting.  Vision changes.  Severe chest pain.  Seizures. How is this diagnosed? This condition is diagnosed by measuring your blood pressure while you are seated, with your arm resting on a flat surface, your legs uncrossed, and your feet flat on the floor. The cuff of the blood pressure monitor will be placed directly against the skin of your upper arm at the level of   your heart. It should be measured at least twice using the same arm. Certain conditions can cause a difference in blood pressure between your right and left arms. Certain factors can cause blood pressure readings to be lower or higher than normal for a short period of time:  When your blood pressure is higher when you are in a health care provider's office than when you are at home, this is called white coat  hypertension. Most people with this condition do not need medicines.  When your blood pressure is higher at home than when you are in a health care provider's office, this is called masked hypertension. Most people with this condition may need medicines to control blood pressure. If you have a high blood pressure reading during one visit or you have normal blood pressure with other risk factors, you may be asked to:  Return on a different day to have your blood pressure checked again.  Monitor your blood pressure at home for 1 week or longer. If you are diagnosed with hypertension, you may have other blood or imaging tests to help your health care provider understand your overall risk for other conditions. How is this treated? This condition is treated by making healthy lifestyle changes, such as eating healthy foods, exercising more, and reducing your alcohol intake. Your health care provider may prescribe medicine if lifestyle changes are not enough to get your blood pressure under control, and if:  Your systolic blood pressure is above 130.  Your diastolic blood pressure is above 80. Your personal target blood pressure may vary depending on your medical conditions, your age, and other factors. Follow these instructions at home: Eating and drinking   Eat a diet that is high in fiber and potassium, and low in sodium, added sugar, and fat. An example eating plan is called the DASH (Dietary Approaches to Stop Hypertension) diet. To eat this way: ? Eat plenty of fresh fruits and vegetables. Try to fill one half of your plate at each meal with fruits and vegetables. ? Eat whole grains, such as whole-wheat pasta, brown rice, or whole-grain bread. Fill about one fourth of your plate with whole grains. ? Eat or drink low-fat dairy products, such as skim milk or low-fat yogurt. ? Avoid fatty cuts of meat, processed or cured meats, and poultry with skin. Fill about one fourth of your plate with lean  proteins, such as fish, chicken without skin, beans, eggs, or tofu. ? Avoid pre-made and processed foods. These tend to be higher in sodium, added sugar, and fat.  Reduce your daily sodium intake. Most people with hypertension should eat less than 1,500 mg of sodium a day.  Do not drink alcohol if: ? Your health care provider tells you not to drink. ? You are pregnant, may be pregnant, or are planning to become pregnant.  If you drink alcohol: ? Limit how much you use to:  0-1 drink a day for women.  0-2 drinks a day for men. ? Be aware of how much alcohol is in your drink. In the [place] one drink equals one 12 oz bottle of beer (355 mL), one 5 oz glass of wine (148 mL), or one 1 oz glass of hard liquor (44 mL). Lifestyle   Work with your health care provider to maintain a healthy body weight or to lose weight. Ask what an ideal weight is for you.  Get at least 30 minutes of exercise most days of the week. Activities may include walking,   swimming, or biking.  Include exercise to strengthen your muscles (resistance exercise), such as Pilates or lifting weights, as part of your weekly exercise routine. Try to do these types of exercises for 30 minutes at least 3 days a week.  Do not use any products that contain nicotine or tobacco, such as cigarettes, e-cigarettes, and chewing tobacco. If you need help quitting, ask your health care provider.  Monitor your blood pressure at home as told by your health care provider.  Keep all follow-up visits as told by your health care provider. This is important. Medicines  Take over-the-counter and prescription medicines only as told by your health care provider. Follow directions carefully. Blood pressure medicines must be taken as prescribed.  Do not skip doses of blood pressure medicine. Doing this puts you at risk for problems and can make the medicine less effective.  Ask your health care provider about side effects or reactions to  medicines that you should watch for. Contact a health care provider if you:  Think you are having a reaction to a medicine you are taking.  Have headaches that keep coming back (recurring).  Feel dizzy.  Have swelling in your ankles.  Have trouble with your vision. Get help right away if you:  Develop a severe headache or confusion.  Have unusual weakness or numbness.  Feel faint.  Have severe pain in your chest or abdomen.  Vomit repeatedly.  Have trouble breathing. Summary  Hypertension is when the force of blood pumping through your arteries is too strong. If this condition is not controlled, it may put you at risk for serious complications.  Your personal target blood pressure may vary depending on your medical conditions, your age, and other factors. For most people, a normal blood pressure is less than 120/80.  Hypertension is treated with lifestyle changes, medicines, or a combination of both. Lifestyle changes include losing weight, eating a healthy, low-sodium diet, exercising more, and limiting alcohol. This information is not intended to replace advice given to you by your health care provider. Make sure you discuss any questions you have with your health care provider. Document Released: 02/23/2005 Document Revised: 11/03/2017 Document Reviewed: 11/03/2017 Elsevier Patient Education  2020 [employer]   Managing Your Hypertension Hypertension is commonly called high blood pressure. This is when the force of your blood pressing against the walls of your arteries is too strong. Arteries are blood vessels that carry blood from your heart throughout your body. Hypertension forces the heart to work harder to pump blood, and may cause the arteries to become narrow or stiff. Having untreated or uncontrolled hypertension can cause heart attack, stroke, kidney disease, and other problems. What are blood pressure readings? A blood pressure reading consists of a higher  number over a lower number. Ideally, your blood pressure should be below 120/80. The first ("top") number is called the systolic pressure. It is a measure of the pressure in your arteries as your heart beats. The second ("bottom") number is called the diastolic pressure. It is a measure of the pressure in your arteries as the heart relaxes. What does my blood pressure reading mean? Blood pressure is classified into four stages. Based on your blood pressure reading, your health care provider may use the following stages to determine what type of treatment you need, if any. Systolic pressure and diastolic pressure are measured in a unit called mm Hg. Normal  Systolic pressure: below 120.  Diastolic pressure: below 80. Elevated  Systolic pressure: 120-129.    Diastolic pressure: below 80. Hypertension stage 1  Systolic pressure: 130-139.  Diastolic pressure: 80-89. Hypertension stage 2  Systolic pressure: 140 or above.  Diastolic pressure: 90 or above. What health risks are associated with hypertension? Managing your hypertension is an important responsibility. Uncontrolled hypertension can lead to:  A heart attack.  A stroke.  A weakened blood vessel (aneurysm).  Heart failure.  Kidney damage.  Eye damage.  Metabolic syndrome.  Memory and concentration problems. What changes can I make to manage my hypertension? Hypertension can be managed by making lifestyle changes and possibly by taking medicines. Your health care provider will help you make a plan to bring your blood pressure within a normal range. Eating and drinking   Eat a diet that is high in fiber and potassium, and low in salt (sodium), added sugar, and fat. An example eating plan is called the DASH (Dietary Approaches to Stop Hypertension) diet. To eat this way: ? Eat plenty of fresh fruits and vegetables. Try to fill half of your plate at each meal with fruits and vegetables. ? Eat whole grains, such as whole  wheat pasta, brown rice, or whole grain bread. Fill about one quarter of your plate with whole grains. ? Eat low-fat diary products. ? Avoid fatty cuts of meat, processed or cured meats, and poultry with skin. Fill about one quarter of your plate with lean proteins such as fish, chicken without skin, beans, eggs, and tofu. ? Avoid premade and processed foods. These tend to be higher in sodium, added sugar, and fat.  Reduce your daily sodium intake. Most people with hypertension should eat less than 1,500 mg of sodium a day.  Limit alcohol intake to no more than 1 drink a day for nonpregnant women and 2 drinks a day for men. One drink equals 12 oz of beer, 5 oz of wine, or 1 oz of hard liquor. Lifestyle  Work with your health care provider to maintain a healthy body weight, or to lose weight. Ask what an ideal weight is for you.  Get at least 30 minutes of exercise that causes your heart to beat faster (aerobic exercise) most days of the week. Activities may include walking, swimming, or biking.  Include exercise to strengthen your muscles (resistance exercise), such as weight lifting, as part of your weekly exercise routine. Try to do these types of exercises for 30 minutes at least 3 days a week.  Do not use any products that contain nicotine or tobacco, such as cigarettes and e-cigarettes. If you need help quitting, ask your health care provider.  Control any long-term (chronic) conditions you have, such as high cholesterol or diabetes. Monitoring  Monitor your blood pressure at home as told by your health care provider. Your personal target blood pressure may vary depending on your medical conditions, your age, and other factors.  Have your blood pressure checked regularly, as often as told by your health care provider. Working with your health care provider  Review all the medicines you take with your health care provider because there may be side effects or interactions.  Talk with  your health care provider about your diet, exercise habits, and other lifestyle factors that may be contributing to hypertension.  Visit your health care provider regularly. Your health care provider can help you create and adjust your plan for managing hypertension. Will I need medicine to control my blood pressure? Your health care provider may prescribe medicine if lifestyle changes are not enough to get your   blood pressure under control, and if:  Your systolic blood pressure is 130 or higher.  Your diastolic blood pressure is 80 or higher. Take medicines only as told by your health care provider. Follow the directions carefully. Blood pressure medicines must be taken as prescribed. The medicine does not work as well when you skip doses. Skipping doses also puts you at risk for problems. Contact a health care provider if:  You think you are having a reaction to medicines you have taken.  You have repeated (recurrent) headaches.  You feel dizzy.  You have swelling in your ankles.  You have trouble with your vision. Get help right away if:  You develop a severe headache or confusion.  You have unusual weakness or numbness, or you feel faint.  You have severe pain in your chest or abdomen.  You vomit repeatedly.  You have trouble breathing. Summary  Hypertension is when the force of blood pumping through your arteries is too strong. If this condition is not controlled, it may put you at risk for serious complications.  Your personal target blood pressure may vary depending on your medical conditions, your age, and other factors. For most people, a normal blood pressure is less than 120/80.  Hypertension is managed by lifestyle changes, medicines, or both. Lifestyle changes include weight loss, eating a healthy, low-sodium diet, exercising more, and limiting alcohol. This information is not intended to replace advice given to you by your health care provider. Make sure you  discuss any questions you have with your health care provider. Document Released: 11/18/2011 Document Revised: 06/17/2018 Document Reviewed: 01/22/2016 Elsevier Patient Education  2020 [employer] Inc[employer]   DASH Eating Plan DASH stands for "Dietary Approaches to Stop Hypertension." The DASH eating plan is a healthy eating plan that has been shown to reduce high blood pressure (hypertension). It may also reduce your risk for type 2 diabetes, heart disease, and stroke. The DASH eating plan may also help with weight loss. What are tips for following this plan?  General guidelines  Avoid eating more than 2,300 mg (milligrams) of salt (sodium) a day. If you have hypertension, you may need to reduce your sodium intake to 1,500 mg a day.  Limit alcohol intake to no more than 1 drink a day for nonpregnant women and 2 drinks a day for men. One drink equals 12 oz of beer, 5 oz of wine, or 1 oz of hard liquor.  Work with your health care provider to maintain a healthy body weight or to lose weight. Ask what an ideal weight is for you.  Get at least 30 minutes of exercise that causes your heart to beat faster (aerobic exercise) most days of the week. Activities may include walking, swimming, or biking.  Work with your health care provider or diet and nutrition specialist (dietitian) to adjust your eating plan to your individual calorie needs. Reading food labels   Check food labels for the amount of sodium per serving. Choose foods with less than 5 percent of the Daily Value of sodium. Generally, foods with less than 300 mg of sodium per serving fit into this eating plan.  To find whole grains, look for the word "whole" as the first word in the ingredient list. Shopping  Buy products labeled as "low-sodium" or "no salt added."  Buy fresh foods. Avoid canned foods and premade or frozen meals. Cooking  Avoid adding salt when cooking. Use salt-free seasonings or herbs instead of table salt or sea salt.    Check with your health care provider or pharmacist before using salt substitutes.  Do not fry foods. Cook foods using healthy methods such as baking, boiling, grilling, and broiling instead.  Cook with heart-healthy oils, such as olive, canola, soybean, or sunflower oil. Meal planning  Eat a balanced diet that includes: ? 5 or more servings of fruits and vegetables each day. At each meal, try to fill half of your plate with fruits and vegetables. ? Up to 6-8 servings of whole grains each day. ? Less than 6 oz of lean meat, poultry, or fish each day. A 3-oz serving of meat is about the same size as a deck of cards. One egg equals 1 oz. ? 2 servings of low-fat dairy each day. ? A serving of nuts, seeds, or beans 5 times each week. ? Heart-healthy fats. Healthy fats called Omega-3 fatty acids are found in foods such as flaxseeds and coldwater fish, like sardines, salmon, and mackerel.  Limit how much you eat of the following: ? Canned or prepackaged foods. ? Food that is high in trans fat, such as fried foods. ? Food that is high in saturated fat, such as fatty meat. ? Sweets, desserts, sugary drinks, and other foods with added sugar. ? Full-fat dairy products.  Do not salt foods before eating.  Try to eat at least 2 vegetarian meals each week.  Eat more home-cooked food and less restaurant, buffet, and fast food.  When eating at a restaurant, ask that your food be prepared with less salt or no salt, if possible. What foods are recommended? The items listed may not be a complete list. Talk with your dietitian about what dietary choices are best for you. Grains Whole-grain or whole-wheat bread. Whole-grain or whole-wheat pasta. Brown rice. Oatmeal. Quinoa. Bulgur. Whole-grain and low-sodium cereals. Pita bread. Low-fat, low-sodium crackers. Whole-wheat flour tortillas. Vegetables Fresh or frozen vegetables (raw, steamed, roasted, or grilled). Low-sodium or reduced-sodium tomato and  vegetable juice. Low-sodium or reduced-sodium tomato sauce and tomato paste. Low-sodium or reduced-sodium canned vegetables. Fruits All fresh, dried, or frozen fruit. Canned fruit in natural juice (without added sugar). Meat and other protein foods Skinless chicken or turkey. Ground chicken or turkey. Pork with fat trimmed off. Fish and seafood. Egg whites. Dried beans, peas, or lentils. Unsalted nuts, nut butters, and seeds. Unsalted canned beans. Lean cuts of beef with fat trimmed off. Low-sodium, lean deli meat. Dairy Low-fat (1%) or fat-free (skim) milk. Fat-free, low-fat, or reduced-fat cheeses. Nonfat, low-sodium ricotta or cottage cheese. Low-fat or nonfat yogurt. Low-fat, low-sodium cheese. Fats and oils Soft margarine without trans fats. Vegetable oil. Low-fat, reduced-fat, or light mayonnaise and salad dressings (reduced-sodium). Canola, safflower, olive, soybean, and sunflower oils. Avocado. Seasoning and other foods Herbs. Spices. Seasoning mixes without salt. Unsalted popcorn and pretzels. Fat-free sweets. What foods are not recommended? The items listed may not be a complete list. Talk with your dietitian about what dietary choices are best for you. Grains Baked goods made with fat, such as croissants, muffins, or some breads. Dry pasta or rice meal packs. Vegetables Creamed or fried vegetables. Vegetables in a cheese sauce. Regular canned vegetables (not low-sodium or reduced-sodium). Regular canned tomato sauce and paste (not low-sodium or reduced-sodium). Regular tomato and vegetable juice (not low-sodium or reduced-sodium). Pickles. Olives. Fruits Canned fruit in a light or heavy syrup. Fried fruit. Fruit in cream or butter sauce. Meat and other protein foods Fatty cuts of meat. Ribs. Fried meat. Bacon. Sausage. Bologna and other processed lunch   meats. Salami. Fatback. Hotdogs. Bratwurst. Salted nuts and seeds. Canned beans with added salt. Canned or smoked fish. Whole eggs or  egg yolks. Chicken or turkey with skin. Dairy Whole or 2% milk, cream, and half-and-half. Whole or full-fat cream cheese. Whole-fat or sweetened yogurt. Full-fat cheese. Nondairy creamers. Whipped toppings. Processed cheese and cheese spreads. Fats and oils Butter. Stick margarine. Lard. Shortening. Ghee. Bacon fat. Tropical oils, such as coconut, palm kernel, or palm oil. Seasoning and other foods Salted popcorn and pretzels. Onion salt, garlic salt, seasoned salt, table salt, and sea salt. Worcestershire sauce. Tartar sauce. Barbecue sauce. Teriyaki sauce. Soy sauce, including reduced-sodium. Steak sauce. Canned and packaged gravies. Fish sauce. Oyster sauce. Cocktail sauce. Horseradish that you find on the shelf. Ketchup. Mustard. Meat flavorings and tenderizers. Bouillon cubes. Hot sauce and Tabasco sauce. Premade or packaged marinades. Premade or packaged taco seasonings. Relishes. Regular salad dressings. Where to find more information:  National Heart, Lung, and Blood Institute: www.nhlbi.nih.gov  American Heart Association: www.heart.org Summary  The DASH eating plan is a healthy eating plan that has been shown to reduce high blood pressure (hypertension). It may also reduce your risk for type 2 diabetes, heart disease, and stroke.  With the DASH eating plan, you should limit salt (sodium) intake to 2,300 mg a day. If you have hypertension, you may need to reduce your sodium intake to 1,500 mg a day.  When on the DASH eating plan, aim to eat more fresh fruits and vegetables, whole grains, lean proteins, low-fat dairy, and heart-healthy fats.  Work with your health care provider or diet and nutrition specialist (dietitian) to adjust your eating plan to your individual calorie needs. This information is not intended to replace advice given to you by your health care provider. Make sure you discuss any questions you have with your health care provider. Document Released: 02/12/2011  Document Revised: 02/05/2017 Document Reviewed: 02/17/2016 Elsevier Patient Education  2020 Elsevier Inc.   Low-Sodium Eating Plan Sodium, which is an element that makes up salt, helps you maintain a healthy balance of fluids in your body. Too much sodium can increase your blood pressure and cause fluid and waste to be held in your body. Your health care provider or dietitian may recommend following this plan if you have high blood pressure (hypertension), kidney disease, liver disease, or heart failure. Eating less sodium can help lower your blood pressure, reduce swelling, and protect your heart, liver, and kidneys. What are tips for following this plan? General guidelines  Most people on this plan should limit their sodium intake to 1,500-2,000 mg (milligrams) of sodium each day. Reading food labels   The Nutrition Facts label lists the amount of sodium in one serving of the food. If you eat more than one serving, you must multiply the listed amount of sodium by the number of servings.  Choose foods with less than 140 mg of sodium per serving.  Avoid foods with 300 mg of sodium or more per serving. Shopping  Look for lower-sodium products, often labeled as "low-sodium" or "no salt added."  Always check the sodium content even if foods are labeled as "unsalted" or "no salt added".  Buy fresh foods. ? Avoid canned foods and premade or frozen meals. ? Avoid canned, cured, or processed meats  Buy breads that have less than 80 mg of sodium per slice. Cooking  Eat more home-cooked food and less restaurant, buffet, and fast food.  Avoid adding salt when cooking. Use salt-free seasonings or   herbs instead of table salt or sea salt. Check with your health care provider or pharmacist before using salt substitutes.  Cook with plant-based oils, such as canola, sunflower, or olive oil. Meal planning  When eating at a restaurant, ask that your food be prepared with less salt or no salt,  if possible.  Avoid foods that contain MSG (monosodium glutamate). MSG is sometimes added to Chinese food, bouillon, and some canned foods. What foods are recommended? The items listed may not be a complete list. Talk with your dietitian about what dietary choices are best for you. Grains Low-sodium cereals, including oats, puffed wheat and rice, and shredded wheat. Low-sodium crackers. Unsalted rice. Unsalted pasta. Low-sodium bread. Whole-grain breads and whole-grain pasta. Vegetables Fresh or frozen vegetables. "No salt added" canned vegetables. "No salt added" tomato sauce and paste. Low-sodium or reduced-sodium tomato and vegetable juice. Fruits Fresh, frozen, or canned fruit. Fruit juice. Meats and other protein foods Fresh or frozen (no salt added) meat, poultry, seafood, and fish. Low-sodium canned tuna and salmon. Unsalted nuts. Dried peas, beans, and lentils without added salt. Unsalted canned beans. Eggs. Unsalted nut butters. Dairy Milk. Soy milk. Cheese that is naturally low in sodium, such as ricotta cheese, fresh mozzarella, or Swiss cheese Low-sodium or reduced-sodium cheese. Cream cheese. Yogurt. Fats and oils Unsalted butter. Unsalted margarine with no trans fat. Vegetable oils such as canola or olive oils. Seasonings and other foods Fresh and dried herbs and spices. Salt-free seasonings. Low-sodium mustard and ketchup. Sodium-free salad dressing. Sodium-free light mayonnaise. Fresh or refrigerated horseradish. Lemon juice. Vinegar. Homemade, reduced-sodium, or low-sodium soups. Unsalted popcorn and pretzels. Low-salt or salt-free chips. What foods are not recommended? The items listed may not be a complete list. Talk with your dietitian about what dietary choices are best for you. Grains Instant hot cereals. Bread stuffing, pancake, and biscuit mixes. Croutons. Seasoned rice or pasta mixes. Noodle soup cups. Boxed or frozen macaroni and cheese. Regular salted crackers.  Self-rising flour. Vegetables Sauerkraut, pickled vegetables, and relishes. Olives. French fries. Onion rings. Regular canned vegetables (not low-sodium or reduced-sodium). Regular canned tomato sauce and paste (not low-sodium or reduced-sodium). Regular tomato and vegetable juice (not low-sodium or reduced-sodium). Frozen vegetables in sauces. Meats and other protein foods Meat or fish that is salted, canned, smoked, spiced, or pickled. Bacon, ham, sausage, hotdogs, corned beef, chipped beef, packaged lunch meats, salt pork, jerky, pickled herring, anchovies, regular canned tuna, sardines, salted nuts. Dairy Processed cheese and cheese spreads. Cheese curds. Blue cheese. Feta cheese. String cheese. Regular cottage cheese. Buttermilk. Canned milk. Fats and oils Salted butter. Regular margarine. Ghee. Bacon fat. Seasonings and other foods Onion salt, garlic salt, seasoned salt, table salt, and sea salt. Canned and packaged gravies. Worcestershire sauce. Tartar sauce. Barbecue sauce. Teriyaki sauce. Soy sauce, including reduced-sodium. Steak sauce. Fish sauce. Oyster sauce. Cocktail sauce. Horseradish that you find on the shelf. Regular ketchup and mustard. Meat flavorings and tenderizers. Bouillon cubes. Hot sauce and Tabasco sauce. Premade or packaged marinades. Premade or packaged taco seasonings. Relishes. Regular salad dressings. Salsa. Potato and tortilla chips. Corn chips and puffs. Salted popcorn and pretzels. Canned or dried soups. Pizza. Frozen entrees and pot pies. Summary  Eating less sodium can help lower your blood pressure, reduce swelling, and protect your heart, liver, and kidneys.  Most people on this plan should limit their sodium intake to 1,500-2,000 mg (milligrams) of sodium each day.  Canned, boxed, and frozen foods are high in sodium. Restaurant foods, fast foods,   and pizza are also very high in sodium. You also get sodium by adding salt to food.  Try to cook at home, eat  more fresh fruits and vegetables, and eat less fast food, canned, processed, or prepared foods. This information is not intended to replace advice given to you by your health care provider. Make sure you discuss any questions you have with your health care provider. Document Released: 08/15/2001 Document Revised: 02/05/2017 Document Reviewed: 02/17/2016 Elsevier Patient Education  2020 Elsevier Inc.     Why follow it? Research shows. . Those who follow the Mediterranean diet have a reduced risk of heart disease  . The diet is associated with a reduced incidence of Parkinson's and Alzheimer's diseases . People following the diet may have longer life expectancies and lower rates of chronic diseases  . The Dietary Guidelines for Americans recommends the Mediterranean diet as an eating plan to promote health and prevent disease  What Is the Mediterranean Diet?  . Healthy eating plan based on typical foods and recipes of Mediterranean-style cooking . The diet is primarily a plant based diet; these foods should make up a majority of meals   Starches - Plant based foods should make up a majority of meals - They are an important sources of vitamins, minerals, energy, antioxidants, and fiber - Choose whole grains, foods high in fiber and minimally processed items  - Typical grain sources include wheat, oats, barley, corn, brown rice, bulgar, farro, millet, polenta, couscous  - Various types of beans include chickpeas, lentils, fava beans, black beans, white beans   Fruits  Veggies - Large quantities of antioxidant rich fruits & veggies; 6 or more servings  - Vegetables can be eaten raw or lightly drizzled with oil and cooked  - Vegetables common to the traditional Mediterranean Diet include: artichokes, arugula, beets, broccoli, brussel sprouts, cabbage, carrots, celery, collard greens, cucumbers, eggplant, kale, leeks, lemons, lettuce, mushrooms, okra, onions, peas, peppers, potatoes, pumpkin,  radishes, rutabaga, shallots, spinach, sweet potatoes, turnips, zucchini - Fruits common to the Mediterranean Diet include: apples, apricots, avocados, cherries, clementines, dates, figs, grapefruits, grapes, melons, nectarines, oranges, peaches, pears, pomegranates, strawberries, tangerines  Fats - Replace butter and margarine with healthy oils, such as olive oil, canola oil, and tahini  - Limit nuts to no more than a handful a day  - Nuts include walnuts, almonds, pecans, pistachios, pine nuts  - Limit or avoid candied, honey roasted or heavily salted nuts - Olives are central to the Mediterranean diet - can be eaten whole or used in a variety of dishes   Meats Protein - Limiting red meat: no more than a few times a month - When eating red meat: choose lean cuts and keep the portion to the size of deck of cards - Eggs: approx. 0 to 4 times a week  - Fish and lean poultry: at least 2 a week  - Healthy protein sources include, chicken, turkey, lean beef, lamb - Increase intake of seafood such as tuna, salmon, trout, mackerel, shrimp, scallops - Avoid or limit high fat processed meats such as sausage and bacon  Dairy - Include moderate amounts of low fat dairy products  - Focus on healthy dairy such as fat free yogurt, skim milk, low or reduced fat cheese - Limit dairy products higher in fat such as whole or 2% milk, cheese, ice cream  Alcohol - Moderate amounts of red wine is ok  - No more than 5 oz daily for women (all ages)   and men older than age 65  - No more than 10 oz of wine daily for men younger than 65  Other - Limit sweets and other desserts  - Use herbs and spices instead of salt to flavor foods  - Herbs and spices common to the traditional Mediterranean Diet include: basil, bay leaves, chives, cloves, cumin, fennel, garlic, lavender, marjoram, mint, oregano, parsley, pepper, rosemary, sage, savory, sumac, tarragon, thyme   It's not just a diet, it's a lifestyle:  . The  Mediterranean diet includes lifestyle factors typical of those in the region  . Foods, drinks and meals are best eaten with others and savored . Daily physical activity is important for overall good health . This could be strenuous exercise like running and aerobics . This could also be more leisurely activities such as walking, housework, yard-work, or taking the stairs . Moderation is the key; a balanced and healthy diet accommodates most foods and drinks . Consider portion sizes and frequency of consumption of certain foods   Meal Ideas & Options:  . Breakfast:  o Whole wheat toast or whole wheat English muffins with peanut butter & hard boiled egg o Steel cut oats topped with apples & cinnamon and skim milk  o Fresh fruit: banana, strawberries, melon, berries, peaches  o Smoothies: strawberries, bananas, greek yogurt, peanut butter o Low fat greek yogurt with blueberries and granola  o Egg white omelet with spinach and mushrooms o Breakfast couscous: whole wheat couscous, apricots, skim milk, cranberries  . Sandwiches:  o Hummus and grilled vegetables (peppers, zucchini, squash) on whole wheat bread   o Grilled chicken on whole wheat pita with lettuce, tomatoes, cucumbers or tzatziki  o Tuna salad on whole wheat bread: tuna salad made with greek yogurt, olives, red peppers, capers, green onions o Garlic rosemary lamb pita: lamb sauted with garlic, rosemary, salt & pepper; add lettuce, cucumber, greek yogurt to pita - flavor with lemon juice and black pepper  . Seafood:  o Mediterranean grilled salmon, seasoned with garlic, basil, parsley, lemon juice and black pepper o Shrimp, lemon, and spinach whole-grain pasta salad made with low fat greek yogurt  o Seared scallops with lemon orzo  o Seared tuna steaks seasoned salt, pepper, coriander topped with tomato mixture of olives, tomatoes, olive oil, minced garlic, parsley, green onions and cappers  . Meats:  o Herbed greek chicken salad  with kalamata olives, cucumber, feta  o Red bell peppers stuffed with spinach, bulgur, lean ground beef (or lentils) & topped with feta   o Kebabs: skewers of chicken, tomatoes, onions, zucchini, squash  o Turkey burgers: made with red onions, mint, dill, lemon juice, feta cheese topped with roasted red peppers . Vegetarian o Cucumber salad: cucumbers, artichoke hearts, celery, red onion, feta cheese, tossed in olive oil & lemon juice  o Hummus and whole grain pita points with a greek salad (lettuce, tomato, feta, olives, cucumbers, red onion) o Lentil soup with celery, carrots made with vegetable broth, garlic, salt and pepper  o Tabouli salad: parsley, bulgur, mint, scallions, cucumbers, tomato, radishes, lemon juice, olive oil, salt and pepper.      

## 2018-11-28 NOTE — Progress Notes (Signed)
Established Patient Office Visit  Subjective:  Patient ID: Audrey Farley, female    DOB: [birth year]  Age: 58 y.o. MRN: [id number]  CC:  Chief Complaint  Patient presents with  . Hypertension    TOC to manage medications   . Medication Refill    HPI Audrey Farley presents for TOC and medication refill Some spasm of L shoulder - has happened before, episodically takes mobic and flexeril with good effect Recently moved to [name], [place] area. Has not yet established care there yet. HTN: taking telmisartan-HCTZ 80-12.5mg  PO qd with good effect. Denies sxs of HTN including headache, shob, chest pain, visual changes, dependent edema. Followed by cardiology Dr. Skeet Latch Otherwise, no complaints.   Past Medical History:  Diagnosis Date  . Allergy   . Aneurysm of ascending aorta ([facility]) 02/25/2016   3.9cm by echo 02/2016  -aorta enlarged  . Arthritis   . Chest pain 01/24/2016  . Dyspnea on exertion   . Hypertension   . Urge incontinence     Past Surgical History:  Procedure Laterality Date  . ABDOMINAL HYSTERECTOMY     still has cervix; DUB; ovaries intact.  Audrey Farley KNEE ARTHROSCOPY Left 2012  . 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 TOTAL KNEE ARTHROPLASTY Left 09/28/2016   Procedure: LEFT TOTAL KNEE ARTHROPLASTY;  Surgeon: Vickey Huger, MD;  Location: [facility];  Service: Orthopedics;  Laterality: Left;    Family History  Problem Relation Age of Onset  . Diabetes Mother   . Hypertension Mother   . Heart attack Mother   . Hyperlipidemia Mother   . Heart disease Mother        heart problems; no AMI  . Kidney disease Mother   . Hyperlipidemia Sister   . Hypertension Sister   . Multiple sclerosis Sister   . Mental illness Brother   . Epilepsy Brother   . Diabetes Sister   . Sudden death Neg Hx     Social History   Socioeconomic History  . Marital status: Single    Spouse name: Not on file  . Number of children: Not on file  . Years of education: Not on  file  . Highest education level: Not on file  Occupational History  . Not on file  Social Needs  . Financial resource strain: Not on file  . Food insecurity    Worry: Not on file    Inability: Not on file  . Transportation needs    Medical: Not on file    Non-medical: Not on file  Tobacco Use  . Smoking status: Never Smoker  . Smokeless tobacco: Never Used  Substance and Sexual Activity  . Alcohol use: Yes    Comment: occ vodka  . Drug use: No  . Sexual activity: Yes    Birth control/protection: Surgical, Post-menopausal  Lifestyle  . Physical activity    Days per week: Not on file    Minutes per session: Not on file  . Stress: Not on file  Relationships  . Social Herbalist on phone: Not on file    Gets together: Not on file    Attends religious service: Not on file    Active member of club or organization: Not on file    Attends meetings of clubs or organizations: Not on file    Relationship status: Not on file  . Intimate partner violence    Fear of current or ex partner: Not on file    Emotionally abused: Not  on file    Physically abused: Not on file    Forced sexual activity: Not on file  Other Topics Concern  . Not on file  Social History Narrative   Marital status: single; not dating since 2014      Children: 2 daughters (16, 60); no grandchildren      Lives: with 2 daughters       Employment: tobacco company; Stage manager x 16 years; loves job!  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        Tobacco: none      Alcohol; weekends/socially      Drugs: none      Exercise:  Two days per week; cardio      Seatbelt: 100%; no texting      Sexual activity: not sexually active since 2014; dates males; no STDs; total partners = 7.    Outpatient Medications Prior to Visit  Medication Sig Dispense Refill  . amLODipine (NORVASC) 2.5 MG tablet Take 1 tablet (2.5 mg total) by mouth daily. 90 tablet 3  . meloxicam (MOBIC) 15 MG  tablet     . telmisartan-hydrochlorothiazide (MICARDIS HCT) 80-12.5 MG tablet Take 1 tablet by mouth daily. 30 tablet 0   No facility-administered medications prior to visit.     Allergies  Allergen Reactions  . No Known Allergies     ROS Review of Systems  Constitutional: Negative.   HENT: Negative.   Eyes: Negative.   Respiratory: Negative.   Cardiovascular: Negative.   Gastrointestinal: Negative.   Endocrine: Negative.   Genitourinary: Negative.   Musculoskeletal: Positive for arthralgias (L shoulder) and myalgias (L shoulder).  Skin: Negative.   Allergic/Immunologic: Negative.   Neurological: Negative.   Hematological: Negative.   Psychiatric/Behavioral: Negative.   All other systems reviewed and are negative.     Objective:    Physical Exam  Constitutional: She is oriented to person, place, and time. She appears well-developed and well-nourished. No distress.  Cardiovascular: Normal rate, regular rhythm and normal heart sounds.  Pulmonary/Chest: Effort normal and breath sounds normal. No respiratory distress.  Musculoskeletal:        General: Tenderness (L shoulder) present. No deformity or edema.     Left shoulder: She exhibits decreased range of motion, tenderness and spasm. She exhibits no swelling, no crepitus and normal strength.  Neurological: She is alert and oriented to person, place, and time.  Skin: Skin is warm and dry. No rash noted. She is not diaphoretic. No erythema. No pallor.  Psychiatric: She has a normal mood and affect. Her behavior is normal. Judgment and thought content normal.  Nursing note and vitals reviewed.   BP (!) 142/82   Pulse 77   Temp 98.7 F (37.1 C) (Oral)   Resp 16   Wt 232 lb (105.2 kg)   SpO2 99%   BMI 40.45 kg/m  Wt Readings from Last 3 Encounters:  11/25/18 232 lb (105.2 kg)  12/03/17 226 lb 3.2 oz (102.6 kg)  11/26/17 225 lb 12.8 oz (102.4 kg)     Health Maintenance Due  Topic Date Due  . COLONOSCOPY   08/16/2012    There are no preventive care reminders to display for this patient.  Lab Results  Component Value Date   TSH 1.240 05/24/2017   Lab Results  Component Value Date   WBC 6.9 12/03/2017   HGB 12.2 12/03/2017   HCT 36.7 12/03/2017   MCV 85 12/03/2017   PLT 213 12/03/2017  Lab Results  Component Value Date   NA 139 12/03/2017   K 3.3 (L) 12/03/2017   CO2 23 12/03/2017   GLUCOSE 83 12/03/2017   BUN 26 (H) 12/03/2017   CREATININE 0.98 12/03/2017   BILITOT 0.3 12/03/2017   ALKPHOS 99 12/03/2017   AST 15 12/03/2017   ALT 16 12/03/2017   PROT 7.1 12/03/2017   ALBUMIN 4.6 12/03/2017   CALCIUM 9.8 12/03/2017   ANIONGAP 11 09/29/2016   Lab Results  Component Value Date   CHOL 215 (H) 05/24/2017   Lab Results  Component Value Date   HDL 85 05/24/2017   Lab Results  Component Value Date   LDLCALC 115 (H) 05/24/2017   Lab Results  Component Value Date   TRIG 76 05/24/2017   Lab Results  Component Value Date   CHOLHDL 2.5 05/24/2017   Lab Results  Component Value Date   HGBA1C 5.8 (H) 12/03/2017      Assessment & Plan:   Problem List Items Addressed This Visit      Cardiovascular and Mediastinum   HTN (hypertension)   Relevant Medications   telmisartan-hydrochlorothiazide (MICARDIS HCT) 80-12.5 MG tablet    Other Visit Diagnoses    Muscle spasm of left shoulder    -  Primary   Relevant Medications   cyclobenzaprine (FLEXERIL) 5 MG tablet   meloxicam (MOBIC) 7.5 MG tablet      Meds ordered this encounter  Medications  . cyclobenzaprine (FLEXERIL) 5 MG tablet    Sig: Take 1 tablet (5 mg total) by mouth 3 (three) times daily as needed for muscle spasms.    Dispense:  30 tablet    Refill:  1    Order Specific Question:   Supervising Provider    Answer:   Delia Chimes A [id number]  . meloxicam (MOBIC) 7.5 MG tablet    Sig: Take 1 tablet (7.5 mg total) by mouth daily.    Dispense:  30 tablet    Refill:  0    Order Specific Question:    Supervising Provider    Answer:   Delia Chimes A [id number]  . telmisartan-hydrochlorothiazide (MICARDIS HCT) 80-12.5 MG tablet    Sig: Take 1 tablet by mouth daily.    Dispense:  90 tablet    Refill:  0    Order Specific Question:   Supervising Provider    Answer:   Forrest Moron [id number]    Follow-up: Return if symptoms worsen or fail to improve.   PLAN  Refilled telmisartan-HCTZ x 90 days - either follow up with myself or establish in [place], pt preference  Refilled mobic and flexeril  Patient encouraged to call clinic with any questions, comments, or concerns.   Maximiano Coss, NP

## 2019-04-10 ENCOUNTER — Other Ambulatory Visit: Payer: Self-pay | Admitting: Emergency Medicine

## 2019-04-10 ENCOUNTER — Telehealth: Payer: Self-pay | Admitting: Registered Nurse

## 2019-04-10 DIAGNOSIS — I10 Essential (primary) hypertension: Secondary | ICD-10-CM

## 2019-04-10 MED ORDER — TELMISARTAN-HCTZ 80-12.5 MG PO TABS: 1.0000 | ORAL_TABLET | Freq: Every day | ORAL | 0 refills | Status: DC

## 2019-04-10 NOTE — Telephone Encounter (Signed)
What is the name of the medication? micardis hct  Have you contacted your pharmacy to request a refill? no  Which pharmacy would you like this sent to? [facility] in [place]   Patient notified that their request is being sent to the clinical staff for review and that they should receive a call once it is complete. If they do not receive a call within 72 hours they can check with their pharmacy or our office.

## 2019-04-17 ENCOUNTER — Other Ambulatory Visit: Payer: Self-pay | Admitting: Emergency Medicine

## 2019-04-17 DIAGNOSIS — I10 Essential (primary) hypertension: Secondary | ICD-10-CM

## 2019-04-17 MED ORDER — TELMISARTAN-HCTZ 80-12.5 MG PO TABS: 1.0000 | ORAL_TABLET | Freq: Every day | ORAL | 0 refills | Status: DC

## 2019-04-17 NOTE — Telephone Encounter (Signed)
Pt calling to receive update on the medication refill   Please advise

## 2019-04-17 NOTE — Telephone Encounter (Signed)
Rx was resent to the [facility] in [place]

## 2019-05-31 ENCOUNTER — Telehealth: Payer: Self-pay | Admitting: *Deleted

## 2019-05-31 NOTE — Telephone Encounter (Signed)

## 2019-06-02 ENCOUNTER — Telehealth: Payer: Self-pay | Admitting: *Deleted

## 2019-06-02 ENCOUNTER — Encounter: Payer: Self-pay | Admitting: Cardiology

## 2019-06-02 ENCOUNTER — Other Ambulatory Visit: Payer: Self-pay

## 2019-06-02 ENCOUNTER — Ambulatory Visit (INDEPENDENT_AMBULATORY_CARE_PROVIDER_SITE_OTHER): Payer: Managed Care, Other (non HMO) | Admitting: Cardiology

## 2019-06-02 VITALS — BP 130/60 | HR 67 | Ht 63.5 in | Wt 236.6 lb

## 2019-06-02 DIAGNOSIS — R0602 Shortness of breath: Secondary | ICD-10-CM

## 2019-06-02 DIAGNOSIS — I7121 Aneurysm of the ascending aorta, without rupture: Secondary | ICD-10-CM

## 2019-06-02 DIAGNOSIS — R0789 Other chest pain: Secondary | ICD-10-CM | POA: Insufficient documentation

## 2019-06-02 DIAGNOSIS — R9431 Abnormal electrocardiogram [ECG] [EKG]: Secondary | ICD-10-CM | POA: Diagnosis not present

## 2019-06-02 DIAGNOSIS — R079 Chest pain, unspecified: Secondary | ICD-10-CM | POA: Diagnosis not present

## 2019-06-02 DIAGNOSIS — I10 Essential (primary) hypertension: Secondary | ICD-10-CM

## 2019-06-02 DIAGNOSIS — R0600 Dyspnea, unspecified: Secondary | ICD-10-CM | POA: Diagnosis not present

## 2019-06-02 DIAGNOSIS — I712 Thoracic aortic aneurysm, without rupture: Secondary | ICD-10-CM

## 2019-06-02 DIAGNOSIS — R0609 Other forms of dyspnea: Secondary | ICD-10-CM | POA: Insufficient documentation

## 2019-06-02 MED ORDER — ASPIRIN EC 81 MG PO TBEC: 81.0000 mg | DELAYED_RELEASE_TABLET | Freq: Every day | ORAL | 3 refills | Status: DC

## 2019-06-02 NOTE — Progress Notes (Signed)
Cardiology Office Note:    Date:  06/02/2019   ID:  Audrey Farley, DOB 05-Jun-1960, MRN [id number]  PCP:  Maximiano Coss, NP  Cardiologist:  Skeet Latch, MD  Electrophysiologist:  None   Referring MD: Maximiano Coss, NP   CC: SOB, chest pain  History of Present Illness:    Audrey Farley is a pleasant 59 y.o. female with a hx of hypertension and a mildly enlarged aorta on prior echocardiogram in 2017. She had a low risk Nuclear stress in 2017.  She last saw Dr. Oval Linsey in September 2019.  A repeat echocardiogram was supposed to be done then but the patient never had this.  She works for [employer] and is moved to [place] area.  She does not have a PCP there yet.  She was contacted by the office for follow-up.  Patient was seen in the office today and noted complaints of palpitations, chest pain, and dyspnea.  She tells me she has intermittent left-sided chest pain, this seems to be worse with changing position.  At rest she is noted intermittent tachycardia that last 1 to 2 minutes.  At work she has to park a good distance away.  To get to the facility she has to walk up steps to a bridge, down steps and then up another set of steps.  She says she is noted she is increasingly short of breath with this activity.  She denies any jaw pain or arm pain. She does have new EKG changes with lateral TWI in her EKG.   Past Medical History:  Diagnosis Date  . Allergy   . Aneurysm of ascending aorta ([facility]) 02/25/2016   3.9cm by echo 02/2016  -aorta enlarged  . Arthritis   . Chest pain 01/24/2016  . Dyspnea on exertion   . Hypertension   . Urge incontinence     Past Surgical History:  Procedure Laterality Date  . ABDOMINAL HYSTERECTOMY     still has cervix; DUB; ovaries intact.  Marland Kitchen KNEE ARTHROSCOPY Left 2012  . 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 TOTAL KNEE ARTHROPLASTY Left 09/28/2016   Procedure: LEFT TOTAL KNEE ARTHROPLASTY;  Surgeon: Vickey Huger, MD;   Location: [facility];  Service: Orthopedics;  Laterality: Left;    Current Medications: No outpatient medications have been marked as taking for the 06/02/19 encounter (Office Visit) with Erlene Quan, PA-C.     Allergies:   No known allergies   Social History   Socioeconomic History  . Marital status: Single    Spouse name: Not on file  . Number of children: Not on file  . Years of education: Not on file  . Highest education level: Not on file  Occupational History  . Not on file  Tobacco Use  . Smoking status: Never Smoker  . Smokeless tobacco: Never Used  Substance and Sexual Activity  . Alcohol use: Yes    Comment: occ vodka  . Drug use: No  . Sexual activity: Yes    Birth control/protection: Surgical, Post-menopausal  Other Topics Concern  . Not on file  Social History Narrative   Marital status: single; not dating since 2014      Children: 2 daughters (16, 48); no grandchildren      Lives: with 2 daughters       Employment: tobacco company; Stage manager x 16 years; loves job!  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        Tobacco: none  Alcohol; weekends/socially      Drugs: none      Exercise:  Two days per week; cardio      Seatbelt: 100%; no texting      Sexual activity: not sexually active since 2014; dates males; no STDs; total partners = 7.   Social Determinants of Health   Financial Resource Strain:   . Difficulty of Paying Living Expenses:   Food Insecurity:   . Worried About Charity fundraiser in the Last Year:   . Arboriculturist in the Last Year:   Transportation Needs:   . Film/video editor (Medical):   Marland Kitchen Lack of Transportation (Non-Medical):   Physical Activity:   . Days of Exercise per Week:   . Minutes of Exercise per Session:   Stress:   . Feeling of Stress :   Social Connections:   . Frequency of Communication with Friends and Family:   . Frequency of Social Gatherings with Friends and Family:   .  Attends Religious Services:   . Active Member of Clubs or Organizations:   . Attends Archivist Meetings:   Marland Kitchen Marital Status:      Family History: The patient's family history includes Diabetes in her mother and sister; Epilepsy in her brother; Heart attack in her mother; Heart disease in her mother; Hyperlipidemia in her mother and sister; Hypertension in her mother and sister; Kidney disease in her mother; Mental illness in her brother; Multiple sclerosis in her sister. There is no history of Sudden death.  ROS:   Please see the history of present illness. She has an uncomfortable golf ball sized soft fluctuant mass Lt posterior chest which appears to be a lipoma     All other systems reviewed and are negative.  EKGs/Labs/Other Studies Reviewed:    The following studies were reviewed today: Nuclear stress 2017 Echo 2019  EKG:  EKG is ordered today.  The ekg ordered today demonstrates NSR-HR 67- inferior-lateral  TWI - new c/w 2019 EKG  Recent Labs: No results found for requested labs within last 8760 hours.  Recent Lipid Panel    Component Value Date/Time   CHOL 215 (H) 05/24/2017 1731   TRIG 76 05/24/2017 1731   HDL 85 05/24/2017 1731   CHOLHDL 2.5 05/24/2017 1731   CHOLHDL 2.1 12/20/2015 1123   VLDL 9 12/20/2015 1123   LDLCALC 115 (H) 05/24/2017 1731    Physical Exam:    VS:  BP 130/60 (BP Location: Left Arm, Patient Position: Sitting, Cuff Size: Large)   Pulse 67   Ht 5' 3.5" (1.613 m)   Wt 236 lb 9.6 oz (107.3 kg)   BMI 41.25 kg/m     Wt Readings from Last 3 Encounters:  06/02/19 236 lb 9.6 oz (107.3 kg)  11/25/18 232 lb (105.2 kg)  12/03/17 226 lb 3.2 oz (102.6 kg)     GEN: Overweight AA, well developed in no acute distress HEENT: Normal NECK: No JVD; No carotid bruits CARDIAC: RRR, no murmurs, rubs, gallops RESPIRATORY:  Clear to auscultation without rales, wheezing or rhonchi  ABDOMEN: Soft, non-tender, non-distended MUSCULOSKELETAL:  No  edema; No deformity  SKIN: Warm and dry-lipoma Lt posterior chest NEUROLOGIC:  Alert and oriented x 3 PSYCHIATRIC:  Normal affect   ASSESSMENT:    Chest pain of uncertain etiology Symptoms somewhat atypical but she has new EKG changes and a history of DOE  DOE (dyspnea on exertion) Increasing DOE for the past few months  Abnormal  finding on EKG New inferior lateral TWI on EKG  Essential hypertension Repeat B/P by me with large cuff- 122/70  Aneurysm of ascending aorta ([facility]) 3.9cm by echo 02/2016- needs f/u echo  PLAN:     I will arrange for the patient to have an exercise Myoview and echo.  She will have fasting lipids, CMET, and CBC when she comes in for this.  I did add ASA 81 mg daily today, consider adding low dose beta blocker PRN for palpitations after stress test and echo results reviewed.    Medication Adjustments/Labs and Tests Ordered: Current medicines are reviewed at length with the patient today.  Concerns regarding medicines are outlined above.  Orders Placed This Encounter  Procedures  . Myocardial Perfusion Imaging  . EKG 12-Lead  . ECHOCARDIOGRAM COMPLETE   Meds ordered this encounter  Medications  . aspirin EC 81 MG tablet    Sig: Take 1 tablet (81 mg total) by mouth daily.    Dispense:  90 tablet    Refill:  3    Patient Instructions  Medication Instructions:  Your physician has recommended you make the following change in your medication:  1. Start Asprin (81 mg) daily.   *If you need a refill on your cardiac medications before your next appointment, please call your pharmacy*    Testing/Procedures: Your physician has requested that you have an echocardiogram. Echocardiography is a painless test that uses sound waves to create images of your heart. It provides your doctor with information about the size and shape of your heart and how well your heart's chambers and valves are working. This procedure takes approximately one hour. There are no  restrictions for this procedure.  Your physician has requested that you have en exercise stress myoview. For further information please visit [email or web address]. Please follow instruction sheet, as given.      Follow-Up: At [facility], you and your health needs are our priority.  As part of our continuing mission to provide you with exceptional heart care, we have created designated Provider Care Teams.  These Care Teams include your primary Cardiologist (physician) and Advanced Practice Providers (APPs -  Physician Assistants and Nurse Practitioners) who all work together to provide you with the care you need, when you need it.  We recommend signing up for the patient portal called "MyChart".  Sign up information is provided on this After Visit Summary.  MyChart is used to connect with patients for Virtual Visits (Telemedicine).  Patients are able to view lab/test results, encounter notes, upcoming appointments, etc.  Non-urgent messages can be sent to your provider as well.   To learn more about what you can do with MyChart, go to [email or web address].    Your next appointment:   As needed based on test results.      Other Instructions You are scheduled for a Myocardial Perfusion Imaging Study on --------- at -------------.   Please arrive 15 minutes prior to your appointment time for registration and insurance purposes.   The test will take approximately 3 to 4 hours to complete; you may bring reading material. If someone comes with you to your appointment, they will need to remain in the main lobby due to limited space in the testing area.   If you are pregnant or breastfeeding, please notify the nuclear lab prior to your appointment.   How to prepare for your Myocardial Perfusion test:   Do not eat or drink 3 hours prior to your test, except you may have  water.    Do not consume products containing caffeine (regular or decaffeinated) 12 hours prior to your test (ex:  coffee, chocolate, soda, tea)   Do bring a list of your current medications with you. If not listed below, you may take your medications as normal.   Bring any held medication to your appointment, as you may be required to take it once the test is complete.   Do wear comfortable clothes (no dresses or overalls) and walking shoes. Tennis shoes are preferred. No heels or open toed shoes.  Do not wear cologne, perfume, aftershave or lotions (deodorant is allowed).   If these instructions are not followed, you test will have to be rescheduled.   Please report to [address] for your test. If you have questions or concerns about your appointment, please call the Nuclear Lab at [phone].  If you cannot keep your appointment, please provide 24 hour notification to the Nuclear lab to avoid a possible $50 charge to your account.          Angelena Form, PA-C  06/02/2019 8:51 AM    [facility]

## 2019-06-02 NOTE — Assessment & Plan Note (Signed)
New inferior lateral TWI on EKG

## 2019-06-02 NOTE — Patient Instructions (Signed)
Medication Instructions:  Your physician has recommended you make the following change in your medication:  1. Start Asprin (81 mg) daily.   *If you need a refill on your cardiac medications before your next appointment, please call your pharmacy*    Testing/Procedures: Your physician has requested that you have an echocardiogram. Echocardiography is a painless test that uses sound waves to create images of your heart. It provides your doctor with information about the size and shape of your heart and how well your heart's chambers and valves are working. This procedure takes approximately one hour. There are no restrictions for this procedure.  Your physician has requested that you have en exercise stress myoview. For further information please visit [email or web address]. Please follow instruction sheet, as given.      Follow-Up: At [facility], you and your health needs are our priority.  As part of our continuing mission to provide you with exceptional heart care, we have created designated Provider Care Teams.  These Care Teams include your primary Cardiologist (physician) and Advanced Practice Providers (APPs -  Physician Assistants and Nurse Practitioners) who all work together to provide you with the care you need, when you need it.  We recommend signing up for the patient portal called "MyChart".  Sign up information is provided on this After Visit Summary.  MyChart is used to connect with patients for Virtual Visits (Telemedicine).  Patients are able to view lab/test results, encounter notes, upcoming appointments, etc.  Non-urgent messages can be sent to your provider as well.   To learn more about what you can do with MyChart, go to [email or web address].    Your next appointment:   As needed based on test results.      Other Instructions You are scheduled for a Myocardial Perfusion Imaging Study on --------- at -------------.   Please arrive 15 minutes prior to your  appointment time for registration and insurance purposes.   The test will take approximately 3 to 4 hours to complete; you may bring reading material. If someone comes with you to your appointment, they will need to remain in the main lobby due to limited space in the testing area.   If you are pregnant or breastfeeding, please notify the nuclear lab prior to your appointment.   How to prepare for your Myocardial Perfusion test:   Do not eat or drink 3 hours prior to your test, except you may have water.    Do not consume products containing caffeine (regular or decaffeinated) 12 hours prior to your test (ex: coffee, chocolate, soda, tea)   Do bring a list of your current medications with you. If not listed below, you may take your medications as normal.   Bring any held medication to your appointment, as you may be required to take it once the test is complete.   Do wear comfortable clothes (no dresses or overalls) and walking shoes. Tennis shoes are preferred. No heels or open toed shoes.  Do not wear cologne, perfume, aftershave or lotions (deodorant is allowed).   If these instructions are not followed, you test will have to be rescheduled.   Please report to [address] for your test. If you have questions or concerns about your appointment, please call the Nuclear Lab at [phone].  If you cannot keep your appointment, please provide 24 hour notification to the Nuclear lab to avoid a possible $50 charge to your account.

## 2019-06-02 NOTE — Telephone Encounter (Signed)
Patient was seen today by Kerin Ransom, PA.  An exercise myoview and echocardiogram were ordered and needed to be scheduled the same day .  The pateint lives near [place], [place].  Patient was informed she would need COVID testing prior to her exercise myoview---patient states she will get this done near her home  To prevent driving back to [place].  Patient was given the date testing needed to be completed and our fax number so we could receive the results.  Patient was informed if we did not receive the results, her exercise myoview would be cancelled.  She voiced her understanding

## 2019-06-02 NOTE — Assessment & Plan Note (Signed)
Increasing DOE for the past few months

## 2019-06-02 NOTE — Assessment & Plan Note (Signed)
Repeat B/P by me with large cuff- 122/70

## 2019-06-02 NOTE — Addendum Note (Signed)
Addended by: Tamsen Snider on: 06/02/2019 08:57 AM   Modules accepted: Orders

## 2019-06-02 NOTE — Assessment & Plan Note (Signed)
3.9cm by echo 02/2016- needs f/u echo

## 2019-06-02 NOTE — Assessment & Plan Note (Signed)
Symptoms somewhat atypical but she has new EKG changes and a history of DOE

## 2019-06-20 ENCOUNTER — Telehealth (HOSPITAL_COMMUNITY): Payer: Self-pay | Admitting: *Deleted

## 2019-06-20 NOTE — Telephone Encounter (Signed)
Left message on voicemail per DPR in reference to upcoming appointment scheduled on 06/23/19 with detailed instructions given per Myocardial Perfusion Study Information Sheet for the test. LM to arrive 15 minutes early, and that it is imperative to arrive on time for appointment to keep from having the test rescheduled. If you need to cancel or reschedule your appointment, please call the office within 24 hours of your appointment. Failure to do so may result in a cancellation of your appointment, and a $50 no show fee. Phone number given for call back for any questions. Kirstie Peri

## 2019-06-23 ENCOUNTER — Other Ambulatory Visit: Payer: Managed Care, Other (non HMO) | Admitting: *Deleted

## 2019-06-23 ENCOUNTER — Ambulatory Visit (HOSPITAL_COMMUNITY): Payer: Managed Care, Other (non HMO) | Attending: Cardiology

## 2019-06-23 ENCOUNTER — Other Ambulatory Visit: Payer: Self-pay

## 2019-06-23 ENCOUNTER — Ambulatory Visit (HOSPITAL_BASED_OUTPATIENT_CLINIC_OR_DEPARTMENT_OTHER): Payer: Managed Care, Other (non HMO)

## 2019-06-23 DIAGNOSIS — R079 Chest pain, unspecified: Secondary | ICD-10-CM | POA: Diagnosis present

## 2019-06-23 DIAGNOSIS — I7121 Aneurysm of the ascending aorta, without rupture: Secondary | ICD-10-CM

## 2019-06-23 DIAGNOSIS — R0602 Shortness of breath: Secondary | ICD-10-CM

## 2019-06-23 DIAGNOSIS — R9431 Abnormal electrocardiogram [ECG] [EKG]: Secondary | ICD-10-CM

## 2019-06-23 DIAGNOSIS — R0609 Other forms of dyspnea: Secondary | ICD-10-CM

## 2019-06-23 DIAGNOSIS — R0600 Dyspnea, unspecified: Secondary | ICD-10-CM

## 2019-06-23 DIAGNOSIS — I10 Essential (primary) hypertension: Secondary | ICD-10-CM

## 2019-06-23 DIAGNOSIS — I712 Thoracic aortic aneurysm, without rupture: Secondary | ICD-10-CM

## 2019-06-23 LAB — CBC WITH DIFFERENTIAL/PLATELET
Basophils Absolute: 0.1 10*3/uL (ref 0.0–0.2)
Basos: 1 %
EOS (ABSOLUTE): 0.2 10*3/uL (ref 0.0–0.4)
Eos: 4 %
Hematocrit: 36.7 % (ref 34.0–46.6)
Hemoglobin: 12.8 g/dL (ref 11.1–15.9)
Immature Grans (Abs): 0 10*3/uL (ref 0.0–0.1)
Immature Granulocytes: 0 %
Lymphocytes Absolute: 2.9 10*3/uL (ref 0.7–3.1)
Lymphs: 46 %
MCH: 29.1 pg (ref 26.6–33.0)
MCHC: 34.9 g/dL (ref 31.5–35.7)
MCV: 83 fL (ref 79–97)
Monocytes Absolute: 0.5 10*3/uL (ref 0.1–0.9)
Monocytes: 8 %
Neutrophils Absolute: 2.6 10*3/uL (ref 1.4–7.0)
Neutrophils: 41 %
Platelets: 154 10*3/uL (ref 150–450)
RBC: 4.4 x10E6/uL (ref 3.77–5.28)
RDW: 12.4 % (ref 11.7–15.4)
WBC: 6.3 10*3/uL (ref 3.4–10.8)

## 2019-06-23 LAB — COMPREHENSIVE METABOLIC PANEL
ALT: 12 IU/L (ref 0–32)
AST: 15 IU/L (ref 0–40)
Albumin/Globulin Ratio: 2 (ref 1.2–2.2)
Albumin: 4.4 g/dL (ref 3.8–4.9)
Alkaline Phosphatase: 112 IU/L (ref 39–117)
BUN/Creatinine Ratio: 22 (ref 9–23)
BUN: 22 mg/dL (ref 6–24)
Bilirubin Total: 0.3 mg/dL (ref 0.0–1.2)
CO2: 21 mmol/L (ref 20–29)
Calcium: 9.6 mg/dL (ref 8.7–10.2)
Chloride: 103 mmol/L (ref 96–106)
Creatinine, Ser: 1.01 mg/dL — ABNORMAL HIGH (ref 0.57–1.00)
GFR calc Af Amer: 72 mL/min/{1.73_m2} (ref >59)
GFR calc non Af Amer: 62 mL/min/{1.73_m2} (ref >59)
Globulin, Total: 2.2 g/dL (ref 1.5–4.5)
Glucose: 97 mg/dL (ref 65–99)
Potassium: 3.8 mmol/L (ref 3.5–5.2)
Sodium: 141 mmol/L (ref 134–144)
Total Protein: 6.6 g/dL (ref 6.0–8.5)

## 2019-06-23 LAB — MYOCARDIAL PERFUSION IMAGING
LV dias vol: 85 mL (ref 46–106)
LV sys vol: 25 mL
Peak HR: 96 {beats}/min
Rest HR: 61 {beats}/min
SDS: 1
SRS: 4
SSS: 5
TID: 0.77

## 2019-06-23 LAB — LIPID PANEL
Chol/HDL Ratio: 2.4 ratio (ref 0.0–4.4)
Cholesterol, Total: 218 mg/dL — ABNORMAL HIGH (ref 100–199)
HDL: 90 mg/dL (ref >39)
LDL Chol Calc (NIH): 117 mg/dL — ABNORMAL HIGH (ref 0–99)
Triglycerides: 61 mg/dL (ref 0–149)
VLDL Cholesterol Cal: 11 mg/dL (ref 5–40)

## 2019-06-23 MED ORDER — TECHNETIUM TC 99M TETROFOSMIN IV KIT
32.5000 | PACK | Freq: Once | INTRAVENOUS | Status: AC | PRN
  Administered 2019-06-23: 32.5 via INTRAVENOUS
  Filled 2019-06-23: qty 33

## 2019-06-23 MED ORDER — REGADENOSON 0.4 MG/5ML IV SOLN
0.4000 mg | Freq: Once | INTRAVENOUS | Status: AC
  Administered 2019-06-23: 0.4 mg via INTRAVENOUS

## 2019-06-23 MED ORDER — TECHNETIUM TC 99M TETROFOSMIN IV KIT
10.9000 | PACK | Freq: Once | INTRAVENOUS | Status: AC | PRN
  Administered 2019-06-23: 10.9 via INTRAVENOUS
  Filled 2019-06-23: qty 11

## 2019-07-04 ENCOUNTER — Other Ambulatory Visit: Payer: Self-pay | Admitting: *Deleted

## 2019-07-04 MED ORDER — DILTIAZEM HCL ER COATED BEADS 120 MG PO CP24: 120.0000 mg | ORAL_CAPSULE | Freq: Every day | ORAL | 3 refills | Status: DC

## 2019-07-07 ENCOUNTER — Telehealth: Payer: Self-pay | Admitting: Cardiology

## 2019-07-07 NOTE — Telephone Encounter (Signed)
   Pt is returning call about her lab and echo result

## 2019-07-07 NOTE — Telephone Encounter (Signed)
Called and spoke with pt, reviewed lab, echo, and stress test results with pt. Telephone note from 4/27 shows that these results were discussed with pt. Also reviewed that Kerin Ransom added diltiazem 120mg  daily and that this was sent to pt's pharmacy on 07/04/19  Pt verbalized understanding with results and medication addition. Reminded her of upcoming appt with Almyra Deforest PA on 08/04/19 at 1:45pm. No other questions from pt at this time.

## 2019-07-17 ENCOUNTER — Other Ambulatory Visit: Payer: Self-pay | Admitting: Registered Nurse

## 2019-07-17 DIAGNOSIS — I10 Essential (primary) hypertension: Secondary | ICD-10-CM

## 2019-07-26 ENCOUNTER — Other Ambulatory Visit: Payer: Self-pay | Admitting: Registered Nurse

## 2019-07-26 DIAGNOSIS — I10 Essential (primary) hypertension: Secondary | ICD-10-CM

## 2019-08-04 ENCOUNTER — Other Ambulatory Visit: Payer: Self-pay

## 2019-08-04 ENCOUNTER — Encounter: Payer: Self-pay | Admitting: Physician Assistant

## 2019-08-04 ENCOUNTER — Ambulatory Visit (INDEPENDENT_AMBULATORY_CARE_PROVIDER_SITE_OTHER): Payer: Managed Care, Other (non HMO) | Admitting: Physician Assistant

## 2019-08-04 VITALS — BP 125/90 | HR 71 | Temp 97.3°F | Ht 63.0 in | Wt 237.8 lb

## 2019-08-04 DIAGNOSIS — I712 Thoracic aortic aneurysm, without rupture, unspecified: Secondary | ICD-10-CM

## 2019-08-04 DIAGNOSIS — I10 Essential (primary) hypertension: Secondary | ICD-10-CM

## 2019-08-04 NOTE — Progress Notes (Signed)
Cardiology Office Note:    Date:  08/07/2019   ID:  Audrey Farley, DOB 05-02-1960, MRN [id number]  PCP:  Maximiano Coss, NP  Cardiologist:  Skeet Latch, MD  Electrophysiologist:  None   Referring MD: Maximiano Coss, NP   Chief Complaint  Patient presents with  . Follow-up    seen for Dr. Oval Linsey    History of Present Illness:    Audrey Farley is a 59 y.o. female with a hx of hypertension and mildly dilated aorta measuring 3.9 cm on previous echocardiogram in 2017. He had a low risk Myoview in 2017. She was last seen by Kerin Ransom, PA-C on 06/02/2019, at which time she was having intermittent chest discomfort. She also has some EKG changes as well. Aspirin 81 mg was added to her medical regimen. She underwent echocardiogram on 06/23/2019 that showed EF 60 to 65%, normal RVSP, no significant valve issue. The aortic root was reportedly normal in size. Myoview performed on 06/23/2019 was normal with EF of 70%, normal perfusion without ischemia or scar. Her blood pressure was elevated during the stress test, looks recommended starting her on diltiazem 120 mg daily.  Patient presents today for cardiology office visit.  She denies any recent chest pain or shortness of breath.  Her blood pressure is very well controlled.  Surprisingly echocardiogram obtained recently did not mention any dilated aorta.  She has follow-up with Dr. Oval Linsey in 2 months, I will defer to Dr. Oval Linsey to see if she wants to get a CT angiogram of the chest at some point to make sure there is no aortic aneurysm.  Otherwise she is completely asymptomatic.  Past Medical History:  Diagnosis Date  . Allergy   . Aneurysm of ascending aorta ([place]) 02/25/2016   3.9cm by echo 02/2016  -aorta enlarged  . Arthritis   . Chest pain 01/24/2016  . Dyspnea on exertion   . Hypertension   . Urge incontinence     Past Surgical History:  Procedure Laterality Date  . ABDOMINAL HYSTERECTOMY     still has cervix;  DUB; ovaries intact.  Marland Kitchen KNEE ARTHROSCOPY Left 2012  . 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 TOTAL KNEE ARTHROPLASTY Left 09/28/2016   Procedure: LEFT TOTAL KNEE ARTHROPLASTY;  Surgeon: Vickey Huger, MD;  Location: [facility];  Service: Orthopedics;  Laterality: Left;    Current Medications: No outpatient medications have been marked as taking for the 08/04/19 encounter (Office Visit) with Almyra Deforest, [place].     Allergies:   No known allergies   Social History   Socioeconomic History  . Marital status: Single    Spouse name: Not on file  . Number of children: Not on file  . Years of education: Not on file  . Highest education level: Not on file  Occupational History  . Not on file  Tobacco Use  . Smoking status: Never Smoker  . Smokeless tobacco: Never Used  Substance and Sexual Activity  . Alcohol use: Yes    Comment: occ vodka  . Drug use: No  . Sexual activity: Yes    Birth control/protection: Surgical, Post-menopausal  Other Topics Concern  . Not on file  Social History Narrative   Marital status: single; not dating since 2014      Children: 2 daughters (16, 48); no grandchildren      Lives: with 2 daughters       Employment: tobacco company; Stage manager x 16 years; loves job!  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        Tobacco: none      Alcohol; weekends/socially      Drugs: none      Exercise:  Two days per week; cardio      Seatbelt: 100%; no texting      Sexual activity: not sexually active since 2014; dates males; no STDs; total partners = 7.   Social Determinants of Health   Financial Resource Strain:   . Difficulty of Paying Living Expenses:   Food Insecurity:   . Worried About Charity fundraiser in the Last Year:   . Arboriculturist in the Last Year:   Transportation Needs:   . Film/video editor (Medical):   Marland Kitchen Lack of Transportation (Non-Medical):   Physical Activity:   . Days of Exercise per Week:   . Minutes of  Exercise per Session:   Stress:   . Feeling of Stress :   Social Connections:   . Frequency of Communication with Friends and Family:   . Frequency of Social Gatherings with Friends and Family:   . Attends Religious Services:   . Active Member of Clubs or Organizations:   . Attends Archivist Meetings:   Marland Kitchen Marital Status:      Family History: The patient's family history includes Diabetes in her mother and sister; Epilepsy in her brother; Heart attack in her mother; Heart disease in her mother; Hyperlipidemia in her mother and sister; Hypertension in her mother and sister; Kidney disease in her mother; Mental illness in her brother; Multiple sclerosis in her sister. There is no history of Sudden death.  ROS:   Please see the history of present illness.     All other systems reviewed and are negative.  EKGs/Labs/Other Studies Reviewed:    The following studies were reviewed today:  Echo 06/23/2019 1. Left ventricular ejection fraction, by estimation, is 60 to 65%. The  left ventricle has normal function. The left ventricle has no regional  wall motion abnormalities. There is mild concentric left ventricular  hypertrophy. Left ventricular diastolic  parameters are indeterminate.  2. Right ventricular systolic function is normal. The right ventricular  size is normal. There is normal pulmonary artery systolic pressure.  3. The mitral valve is normal in structure. Trivial mitral valve  regurgitation. No evidence of mitral stenosis.  4. The aortic valve is normal in structure. Aortic valve regurgitation is  not visualized. No aortic stenosis is present.  5. The inferior vena cava is normal in size with greater than 50%  respiratory variability, suggesting right atrial pressure of 3 mmHg.    Myoview 06/23/2019  Electrically nondiagnostic for ischemia  Normal perfusion. No ischemia or scar  LVEF 70%  Low risk study.  EKG:  EKG is not ordered today.   Recent  Labs: 06/23/2019: ALT 12; BUN 22; Creatinine, Ser 1.01; Hemoglobin 12.8; Platelets 154; Potassium 3.8; Sodium 141  Recent Lipid Panel    Component Value Date/Time   CHOL 218 (H) 06/23/2019 0850   TRIG 61 06/23/2019 0850   HDL 90 06/23/2019 0850   CHOLHDL 2.4 06/23/2019 0850   CHOLHDL 2.1 12/20/2015 1123   VLDL 9 12/20/2015 1123   LDLCALC 117 (H) 06/23/2019 0850    Physical Exam:    VS:  BP 125/90   Pulse 71   Temp (!) 97.3 F (36.3 C)   Ht 5\' 3"  (1.6 m)   Wt 237 lb 12.8 oz (107.9 kg)   SpO2  95%   BMI 42.12 kg/m     Wt Readings from Last 3 Encounters:  08/04/19 237 lb 12.8 oz (107.9 kg)  06/23/19 236 lb (107 kg)  06/02/19 236 lb 9.6 oz (107.3 kg)     GEN:  Well nourished, well developed in no acute distress HEENT: Normal NECK: No JVD; No carotid bruits LYMPHATICS: No lymphadenopathy CARDIAC: RRR, no murmurs, rubs, gallops RESPIRATORY:  Clear to auscultation without rales, wheezing or rhonchi  ABDOMEN: Soft, non-tender, non-distended MUSCULOSKELETAL:  No edema; No deformity  SKIN: Warm and dry NEUROLOGIC:  Alert and oriented x 3 PSYCHIATRIC:  Normal affect   ASSESSMENT:    1. Essential hypertension   2. Thoracic aortic aneurysm without rupture ([place])    PLAN:    In order of problems listed above:  1. Hypertension: Blood pressure stable on current therapy  2. Thoracic aortic aneurysm: Previous echocardiogram showed a 3.9 cm dilated aorta in 2017.  However recent echocardiogram obtained in April 2021 did not mention any dilated aorta.  Will defer to Dr. Oval Linsey to consider a CT angiogram of the chest in the future.  There is a chance that the recent echocardiogram was not high enough to see the entirety of the ascending aorta.   Medication Adjustments/Labs and Tests Ordered: Current medicines are reviewed at length with the patient today.  Concerns regarding medicines are outlined above.  No orders of the defined types were placed in this encounter.  No orders  of the defined types were placed in this encounter.   Patient Instructions  Medication Instructions:  Your physician recommends that you continue on your current medications as directed. Please refer to the Current Medication list given to you today.  *If you need a refill on your cardiac medications before your next appointment, please call your pharmacy*  Lab Work: NONE ordered at this time of appointment   If you have labs (blood work) drawn today and your tests are completely normal, you will receive your results only by: Marland Kitchen MyChart Message (if you have MyChart) OR . A paper copy in the mail If you have any lab test that is abnormal or we need to change your treatment, we will call you to review the results.  Testing/Procedures: NONE ordered at this time of appointment   Follow-Up: At [facility], you and your health needs are our priority.  As part of our continuing mission to provide you with exceptional heart care, we have created designated Provider Care Teams.  These Care Teams include your primary Cardiologist (physician) and Advanced Practice Providers (APPs -  Physician Assistants and Nurse Practitioners) who all work together to provide you with the care you need, when you need it.  We recommend signing up for the patient portal called "MyChart".  Sign up information is provided on this After Visit Summary.  MyChart is used to connect with patients for Virtual Visits (Telemedicine).  Patients are able to view lab/test results, encounter notes, upcoming appointments, etc.  Non-urgent messages can be sent to your provider as well.   To learn more about what you can do with MyChart, go to [email or web address].    Your next appointment:   As Scheduled 09/22/19   The format for your next appointment:   In Person  Provider:   Skeet Latch, MD  Other Instructions      Signed, Almyra Deforest, [place]  08/07/2019 12:03 AM    [place]

## 2019-08-04 NOTE — Patient Instructions (Signed)
Medication Instructions:  Your physician recommends that you continue on your current medications as directed. Please refer to the Current Medication list given to you today.  *If you need a refill on your cardiac medications before your next appointment, please call your pharmacy*  Lab Work: NONE ordered at this time of appointment   If you have labs (blood work) drawn today and your tests are completely normal, you will receive your results only by: Marland Kitchen MyChart Message (if you have MyChart) OR . A paper copy in the mail If you have any lab test that is abnormal or we need to change your treatment, we will call you to review the results.  Testing/Procedures: NONE ordered at this time of appointment   Follow-Up: At [facility], you and your health needs are our priority.  As part of our continuing mission to provide you with exceptional heart care, we have created designated Provider Care Teams.  These Care Teams include your primary Cardiologist (physician) and Advanced Practice Providers (APPs -  Physician Assistants and Nurse Practitioners) who all work together to provide you with the care you need, when you need it.  We recommend signing up for the patient portal called "MyChart".  Sign up information is provided on this After Visit Summary.  MyChart is used to connect with patients for Virtual Visits (Telemedicine).  Patients are able to view lab/test results, encounter notes, upcoming appointments, etc.  Non-urgent messages can be sent to your provider as well.   To learn more about what you can do with MyChart, go to [email or web address].    Your next appointment:   As Scheduled 09/22/19   The format for your next appointment:   In Person  Provider:   Skeet Latch, MD  Other Instructions

## 2019-08-07 ENCOUNTER — Encounter: Payer: Self-pay | Admitting: Physician Assistant

## 2019-09-22 ENCOUNTER — Ambulatory Visit (INDEPENDENT_AMBULATORY_CARE_PROVIDER_SITE_OTHER): Payer: Managed Care, Other (non HMO) | Admitting: Cardiovascular Disease

## 2019-09-22 ENCOUNTER — Other Ambulatory Visit: Payer: Self-pay

## 2019-09-22 ENCOUNTER — Encounter: Payer: Self-pay | Admitting: Cardiovascular Disease

## 2019-09-22 VITALS — BP 130/72 | HR 76 | Ht 63.5 in | Wt 231.8 lb

## 2019-09-22 DIAGNOSIS — I712 Thoracic aortic aneurysm, without rupture, unspecified: Secondary | ICD-10-CM

## 2019-09-22 DIAGNOSIS — I10 Essential (primary) hypertension: Secondary | ICD-10-CM

## 2019-09-22 DIAGNOSIS — K219 Gastro-esophageal reflux disease without esophagitis: Secondary | ICD-10-CM

## 2019-09-22 DIAGNOSIS — I7121 Aneurysm of the ascending aorta, without rupture: Secondary | ICD-10-CM

## 2019-09-22 NOTE — Addendum Note (Signed)
Addended by: Alvina Filbert B on: 09/22/2019 10:26 AM   Modules accepted: Orders

## 2019-09-22 NOTE — Progress Notes (Signed)
Cardiology Office Note   Date:  09/22/2019   ID:  Audrey Farley, DOB Sep 05, 1960, MRN [id number]  PCP:  Maximiano Coss, NP  Cardiologist:   Skeet Latch, MD   No chief complaint on file.    History of Present Illness: Audrey Farley is a 59 y.o. female with hypertension who presents for follow up.  She saw Dr. Reginia Farley on 12/2015 and reported chest pain and shortness of breath.  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  She was referred for an exercise Myoview that revealed LVEF 53% with no ischemia.  She achieved 8 METS on the Bruce protocol.  She also had an echo 02/2016 that showed LVEF 55-60% and mild dilation of the ascending aorta (3.9 cm).  She had a stress 06/2019  And LVEF was 70%.  There was no ischemia. Her BP was high so she started diltiazem 120mg  daily.  She checks her BP at home and it usually runs 110s.  Overall she has been feeling well.  She has been exercising more.  She does elliptical, treadmill, and exercise bike.  She also lifts weights.  She has no exertional chest pain and her breathing has been good.  She denies any lower extremity edema, orthopnea, or PND.  She had a repeat echocardiogram 06/2019 that revealed LVEF 60 to 65% with mild LVH.  Diastolic function was indeterminate.  Her ascending aorta was normal in size.  Her prior echocardiogram in 2017 revealed an ascending aorta of 3.9 cm.    Past Medical History:  Diagnosis Date  . Allergy   . Aneurysm of ascending aorta ([place]) 02/25/2016   3.9cm by echo 02/2016  -aorta enlarged  . Arthritis   . Chest pain 01/24/2016  . Dyspnea on exertion   . Hypertension   . Urge incontinence     Past Surgical History:  Procedure Laterality Date  . ABDOMINAL HYSTERECTOMY     still has cervix; DUB; ovaries intact.  Marland Kitchen KNEE ARTHROSCOPY Left 2012  . 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 TOTAL KNEE ARTHROPLASTY Left 09/28/2016   Procedure: LEFT TOTAL KNEE ARTHROPLASTY;  Surgeon: Audrey Huger, MD;  Location: [facility];  Service: Orthopedics;  Laterality: Left;     Current Outpatient Medications  Medication Sig Dispense Refill  . diltiazem (CARDIZEM CD) 120 MG 24 hr capsule Take 1 capsule (120 mg total) by mouth daily. 90 capsule 3  . telmisartan-hydrochlorothiazide (MICARDIS HCT) 80-12.5 MG tablet Take 1 tablet by mouth once daily 90 tablet 0  . aspirin EC 81 MG tablet Take 1 tablet (81 mg total) by mouth daily. (Patient not taking: Reported on 09/22/2019) 90 tablet 3   No current facility-administered medications for this visit.    Allergies:   No known allergies    Social History:  The patient  reports that she has never smoked. She has never used smokeless tobacco. She reports current alcohol use. She reports that she does not use drugs.   Family History:  The patient's family history includes Diabetes in her mother and sister; Epilepsy in her brother; Heart attack in her mother; Heart disease in her mother; Hyperlipidemia in her mother and sister; Hypertension in her mother and sister; Kidney disease in her mother; Mental illness in her brother; Multiple sclerosis in her sister.    ROS:  Please see the history of present illness.   Otherwise, review of systems are positive for none.   All other systems are reviewed and negative.  PHYSICAL EXAM: VS:  BP 130/72   Pulse 76   Ht 5' 3.5" (1.613 m)   Wt 231 lb 12.8 oz (105.1 kg)   SpO2 96%   BMI 40.42 kg/m  , BMI Body mass index is 40.42 kg/m. GENERAL:  Well appearing HEENT: Pupils equal round and reactive, fundi not visualized, oral mucosa unremarkable NECK:  No jugular venous distention, waveform within normal limits, carotid upstroke brisk and symmetric, no bruits, no thyromegaly LYMPHATICS:  No cervical adenopathy LUNGS:  Clear to auscultation bilaterally HEART:  RRR.  PMI not displaced or sustained,S1 and S2 within normal limits, no S3, no S4, no clicks, no rubs, no murmurs ABD:  Flat, positive bowel sounds  normal in frequency in pitch, no bruits, no rebound, no guarding, no midline pulsatile mass, no hepatomegaly, no splenomegaly EXT:  2 plus pulses throughout, no edema, no cyanosis no clubbing SKIN:  No rashes no nodules NEURO:  Cranial nerves II through XII grossly intact, motor grossly intact throughout PSYCH:  Cognitively intact, oriented to person place and time   EKG:  EKG is not ordered today. The ekg ordered 01/24/16 demonstrates sinus bradycardia.  Non-specific T wave abnormalities.  11/26/17: Sinus bradycardia.  Rate 56 bpm.  Prior inferior infarct  Exercise Myoview 02/14/16: The left ventricular ejection fraction is mildly decreased (45-54%).  Nuclear stress EF: 53%.  Blood pressure demonstrated a hypertensive response to exercise.  Baseline EKG showed NSR with diffuse ST/T wave abnormality in the inferolateral leads. During stress there was 37mm J point depression from baseline with horizontal ST segment depression in the inferolateral leads. EKG nondiagnostic due to resting EKG changes.  The study is normal.  This is a low risk study.  Echo 02/21/16: Study Conclusions  - Left ventricle: The cavity size was normal. There was mild   concentric hypertrophy. Systolic function was normal. The   estimated ejection fraction was in the range of 55% to 60%. Wall   motion was normal; there were no regional wall motion   abnormalities. - Aortic valve: Trileaflet; normal thickness, mildly calcified   leaflets. - Aorta: Ascending aortic diameter: 39 mm (S). - Aortic root: The aortic root was normal in size. - Ascending aorta: The ascending aorta was mildly dilated. - Mitral valve: There was trivial regurgitation. - Tricuspid valve: There was trivial regurgitation.   Recent Labs: 06/23/2019: ALT 12; BUN 22; Creatinine, Ser 1.01; Hemoglobin 12.8; Platelets 154; Potassium 3.8; Sodium 141    Lipid Panel    Component Value Date/Time   CHOL 218 (H) 06/23/2019 0850   TRIG 61  06/23/2019 0850   HDL 90 06/23/2019 0850   CHOLHDL 2.4 06/23/2019 0850   CHOLHDL 2.1 12/20/2015 1123   VLDL 9 12/20/2015 1123   LDLCALC 117 (H) 06/23/2019 0850      Wt Readings from Last 3 Encounters:  09/22/19 231 lb 12.8 oz (105.1 kg)  08/04/19 237 lb 12.8 oz (107.9 kg)  06/23/19 236 lb (107 kg)      ASSESSMENT AND PLAN:  # Atypical chest pain: # GERD:  Resolved.  Negative stress tests in 2017 and 2021.  Likely GERD related.  # Ascending aorta aneurysm: 2017 echo showed that her ascending aorta was mildly enlarged at 3.9 cm.  Her most recent echo in April 2021 revealed a normal ascending aortic size at 3.6 cm.  In April 2020 we will plan to get a CTA of the chest to better assess her aorta size.  Blood pressure management as below.  #  Hypertension: Blood pressure well have been controlled on telmisartan/HCTZ and diltiazem.  # CV Disease Risk: ASCVD 10 year risk is 2.7%. Therefore we will not start aspirin or statin at this time.    Current medicines are reviewed at length with the patient today.  The patient does not have concerns regarding medicines.  The following changes have been made:  no change  Labs/ tests ordered today include:   No orders of the defined types were placed in this encounter.    Disposition:   FU with Emanii Bugbee C. Oval Linsey, MD, [facility] after chest CT.     Signed, Seva Chancy C. Oval Linsey, MD, [facility]  09/22/2019 10:09 AM    [place]

## 2019-09-22 NOTE — Patient Instructions (Signed)
Medication Instructions:  Your physician recommends that you continue on your current medications as directed. Please refer to the Current Medication list given to you today.  *If you need a refill on your cardiac medications before your next appointment, please call your pharmacy*  Lab Work: BMET 1 WEEK PRIOR TO CT   If you have labs (blood work) drawn today and your tests are completely normal, you will receive your results only by: Marland Kitchen MyChart Message (if you have MyChart) OR . A paper copy in the mail If you have any lab test that is abnormal or we need to change your treatment, we will call you to review the results.  Testing/Procedures: CTA OF CHEST   Follow-Up: At [facility], you and your health needs are our priority.  As part of our continuing mission to provide you with exceptional heart care, we have created designated Provider Care Teams.  These Care Teams include your primary Cardiologist (physician) and Advanced Practice Providers (APPs -  Physician Assistants and Nurse Practitioners) who all work together to provide you with the care you need, when you need it.  We recommend signing up for the patient portal called "MyChart".  Sign up information is provided on this After Visit Summary.  MyChart is used to connect with patients for Virtual Visits (Telemedicine).  Patients are able to view lab/test results, encounter notes, upcoming appointments, etc.  Non-urgent messages can be sent to your provider as well.   To learn more about what you can do with MyChart, go to [email or web address].    Your next appointment:   8 month(s) AFTER CT OF CHEST   The format for your next appointment:   In Person  Provider:   You may see Skeet Latch, MD or one of the following Advanced Practice Providers on your designated Care Team:    Kerin Ransom, PA-C  [place], [place]  Coletta Memos, [place]

## 2019-10-18 ENCOUNTER — Other Ambulatory Visit: Payer: Self-pay | Admitting: Registered Nurse

## 2019-10-18 DIAGNOSIS — I10 Essential (primary) hypertension: Secondary | ICD-10-CM

## 2019-12-20 ENCOUNTER — Other Ambulatory Visit: Payer: Self-pay | Admitting: Registered Nurse

## 2019-12-20 DIAGNOSIS — I10 Essential (primary) hypertension: Secondary | ICD-10-CM

## 2019-12-20 MED ORDER — TELMISARTAN-HCTZ 80-12.5 MG PO TABS: 1.0000 | ORAL_TABLET | Freq: Every day | ORAL | 1 refills | Status: DC

## 2019-12-20 NOTE — Telephone Encounter (Signed)
Medication Refill - Medication: telmisartan-hydrochlorothiazide (MICARDIS HCT) 80-12.5 MG tablet    Has the patient contacted their pharmacy? No. (Agent: If no, request that the patient contact the pharmacy for the refill.) (Agent: If yes, when and what did the pharmacy advise?)  Preferred Pharmacy (with phone number or street name):[facility], [address]  [place], [place] [address]  Phone:  [phone] Fax:  [phone]    Agent: Please be advised that RX refills may take up to 3 business days. We ask that you follow-up with your pharmacy.

## 2019-12-20 NOTE — Telephone Encounter (Signed)
I have called pt and scheduled her for follow up visit since she has not been seen in over a year. I have informed her that she needs to keep that appointment because we want to see how she is doing. Pt stated understanding and I have refilled her BP medication.   Scheduled for 01/19/20 @ 3:50 PM for Audrey Farley.

## 2020-01-19 ENCOUNTER — Encounter: Payer: Self-pay | Admitting: Registered Nurse

## 2020-01-19 ENCOUNTER — Ambulatory Visit (INDEPENDENT_AMBULATORY_CARE_PROVIDER_SITE_OTHER): Payer: Managed Care, Other (non HMO) | Admitting: Registered Nurse

## 2020-01-19 ENCOUNTER — Other Ambulatory Visit: Payer: Self-pay

## 2020-01-19 VITALS — BP 136/82 | HR 67 | Temp 98.0°F | Resp 18 | Ht 63.5 in | Wt 232.4 lb

## 2020-01-19 DIAGNOSIS — Z1211 Encounter for screening for malignant neoplasm of colon: Secondary | ICD-10-CM

## 2020-01-19 DIAGNOSIS — M7050 Other bursitis of knee, unspecified knee: Secondary | ICD-10-CM | POA: Insufficient documentation

## 2020-01-19 DIAGNOSIS — D171 Benign lipomatous neoplasm of skin and subcutaneous tissue of trunk: Secondary | ICD-10-CM | POA: Diagnosis not present

## 2020-01-19 NOTE — Patient Instructions (Signed)
° ° ° °  If you have lab work done today you will be contacted with your lab results within the next 2 weeks.  If you have not heard from us then please contact us. The fastest way to get your results is to register for My Chart. ° ° °IF you received an x-ray today, you will receive an invoice from [facility]. Please contact  Radiology at [phone] with questions or concerns regarding your invoice.  ° °IF you received labwork today, you will receive an invoice from [facility]. Please contact [facility] at [phone] with questions or concerns regarding your invoice.  ° °Our billing staff will not be able to assist you with questions regarding bills from these companies. ° °You will be contacted with the lab results as soon as they are available. The fastest way to get your results is to activate your My Chart account. Instructions are located on the last page of this paperwork. If you have not heard from us regarding the results in 2 weeks, please contact this office. °  ° ° ° °

## 2020-01-20 ENCOUNTER — Encounter: Payer: Self-pay | Admitting: Registered Nurse

## 2020-01-20 NOTE — Progress Notes (Signed)
Established Patient Office Visit  Subjective:  Patient ID: Audrey Farley, female    DOB: [birth year]  Age: 59 y.o. MRN: [id number]  CC:  Chief Complaint  Patient presents with   Hospitalization Follow-up    Patient is here for follow for her blood pressure. Patient states she has an small knot on her back that she would like looked at.    HPI Audrey Farley presents for HTN follow up  Unfortunately lost her nephew last week. Has come down to [place] for follow up appts and to be with family. Has led to some stress. Has complicated feelings about loss.   Taking diltiazem 120mg  ER PO qd and telmisartan-HCTZ 80-12.5mg  PO qd. Good effect. No AEs. Feeling well.  Also notes lump on her back. Center-left of back. Painless. Somewhat firm. Well circumscribed. Has noticed for a while and was told it was a lipoma. Suggested that she leave it alone unless it bothers her. It has now started to bother her a little when she moves. Interested in derm referral  Due for colon cancer screening - would prefer referrals go to [facility] as she is in the process of moving up there.  No further concerns.   Past Medical History:  Diagnosis Date   Allergy    Aneurysm of ascending aorta ([place]) 02/25/2016   3.9cm by echo 02/2016  -aorta enlarged   Arthritis    Chest pain 01/24/2016   Dyspnea on exertion    Hypertension    Urge incontinence     Past Surgical History:  Procedure Laterality Date   ABDOMINAL HYSTERECTOMY     still has cervix; DUB; ovaries intact.   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   TOTAL KNEE ARTHROPLASTY Left 09/28/2016   Procedure: LEFT TOTAL KNEE ARTHROPLASTY;  Surgeon: Vickey Huger, MD;  Location: [place];  Service: Orthopedics;  Laterality: Left;    Family History  Problem Relation Age of Onset   Diabetes Mother    Hypertension Mother    Heart attack Mother    Hyperlipidemia Mother    Heart disease Mother        heart  problems; no AMI   Kidney disease Mother    Hyperlipidemia Sister    Hypertension Sister    Multiple sclerosis Sister    Mental illness Brother    Epilepsy Brother    Diabetes Sister    Sudden death Neg Hx     Social History   Socioeconomic History   Marital status: Single    Spouse name: Not on file   Number of children: Not on file   Years of education: Not on file   Highest education level: Not on file  Occupational History   Not on file  Tobacco Use   Smoking status: Never Smoker   Smokeless tobacco: Never Used  Scientific laboratory technician Use: Never used  Substance and Sexual Activity   Alcohol use: Yes    Comment: occ vodka   Drug use: No   Sexual activity: Yes    Birth control/protection: Surgical, Post-menopausal  Other Topics Concern   Not on file  Social History Narrative   Marital status: single; not dating since 2014      Children: 2 daughters (16, 24); no grandchildren      Lives: with 2 daughters       Employment: tobacco company; Stage manager x 16 years; loves job!  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        Tobacco: none      Alcohol; weekends/socially      Drugs: none      Exercise:  Two days per week; cardio      Seatbelt: 100%; no texting      Sexual activity: not sexually active since 2014; dates males; no STDs; total partners = 7.   Social Determinants of Health   Financial Resource Strain:    Difficulty of Paying Living Expenses: Not on file  Food Insecurity:    Worried About Charity fundraiser in the Last Year: Not on file   [employer] of Food in the Last Year: Not on file  Transportation Needs:    Lack of Transportation (Medical): Not on file   Lack of Transportation (Non-Medical): Not on file  Physical Activity:    Days of Exercise per Week: Not on file   Minutes of Exercise per Session: Not on file  Stress:    Feeling of Stress : Not on file  Social Connections:    Frequency of  Communication with Friends and Family: Not on file   Frequency of Social Gatherings with Friends and Family: Not on file   Attends Religious Services: Not on file   Active Member of Clubs or Organizations: Not on file   Attends Archivist Meetings: Not on file   Marital Status: Not on file  Intimate Partner Violence:    Fear of Current or Ex-Partner: Not on file   Emotionally Abused: Not on file   Physically Abused: Not on file   Sexually Abused: Not on file    Outpatient Medications Prior to Visit  Medication Sig Dispense Refill   aspirin EC 81 MG tablet Take 1 tablet (81 mg total) by mouth daily. 90 tablet 3   telmisartan-hydrochlorothiazide (MICARDIS HCT) 80-12.5 MG tablet Take 1 tablet by mouth daily. Must Keep appt for Additional refills. 30 tablet 1   diltiazem (CARDIZEM CD) 120 MG 24 hr capsule Take 1 capsule (120 mg total) by mouth daily. 90 capsule 3   No facility-administered medications prior to visit.    Allergies  Allergen Reactions   No Known Allergies     ROS Review of Systems  Constitutional: Negative.   HENT: Negative.   Eyes: Negative.   Respiratory: Negative.   Cardiovascular: Negative.   Gastrointestinal: Negative.   Genitourinary: Negative.   Musculoskeletal: Negative.   Skin: Negative.   Neurological: Negative.   Psychiatric/Behavioral: Negative.       Objective:    Physical Exam Vitals and nursing note reviewed.  Constitutional:      General: She is not in acute distress.    Appearance: Normal appearance. She is normal weight. She is not ill-appearing, toxic-appearing or diaphoretic.  Cardiovascular:     Rate and Rhythm: Normal rate and regular rhythm.     Heart sounds: Normal heart sounds. No murmur heard.  No friction rub. No gallop.   Pulmonary:     Effort: Pulmonary effort is normal. No respiratory distress.     Breath sounds: Normal breath sounds. No stridor. No wheezing, rhonchi or rales.  Chest:     Chest  wall: No tenderness.  Skin:    General: Skin is warm and dry.     Comments: Lipoma middle left side of back. About 5 inches in diameter. Well circumscribed. Not ttp.   Neurological:     General: No focal deficit present.     Mental Status: She is  alert and oriented to person, place, and time. Mental status is at baseline.  Psychiatric:        Mood and Affect: Mood normal.        Behavior: Behavior normal.        Thought Content: Thought content normal.        Judgment: Judgment normal.     BP (!) 169/103    Pulse 67    Temp 98 F (36.7 C) (Temporal)    Resp 18    Ht 5' 3.5" (1.613 m)    Wt 232 lb 6.4 oz (105.4 kg)    SpO2 97%    BMI 40.52 kg/m  Wt Readings from Last 3 Encounters:  01/19/20 232 lb 6.4 oz (105.4 kg)  09/22/19 231 lb 12.8 oz (105.1 kg)  08/04/19 237 lb 12.8 oz (107.9 kg)     Health Maintenance Due  Topic Date Due   COLONOSCOPY  Never done    There are no preventive care reminders to display for this patient.  Lab Results  Component Value Date   TSH 1.240 05/24/2017   Lab Results  Component Value Date   WBC 6.3 06/23/2019   HGB 12.8 06/23/2019   HCT 36.7 06/23/2019   MCV 83 06/23/2019   PLT 154 06/23/2019   Lab Results  Component Value Date   NA 141 06/23/2019   K 3.8 06/23/2019   CO2 21 06/23/2019   GLUCOSE 97 06/23/2019   BUN 22 06/23/2019   CREATININE 1.01 (H) 06/23/2019   BILITOT 0.3 06/23/2019   ALKPHOS 112 06/23/2019   AST 15 06/23/2019   ALT 12 06/23/2019   PROT 6.6 06/23/2019   ALBUMIN 4.4 06/23/2019   CALCIUM 9.6 06/23/2019   ANIONGAP 11 09/29/2016   Lab Results  Component Value Date   CHOL 218 (H) 06/23/2019   Lab Results  Component Value Date   HDL 90 06/23/2019   Lab Results  Component Value Date   LDLCALC 117 (H) 06/23/2019   Lab Results  Component Value Date   TRIG 61 06/23/2019   Lab Results  Component Value Date   CHOLHDL 2.4 06/23/2019   Lab Results  Component Value Date   HGBA1C 5.8 (H) 12/03/2017       Assessment & Plan:   Problem List Items Addressed This Visit    None    Visit Diagnoses    Colon cancer screening    -  Primary   Relevant Orders   Ambulatory referral to Gastroenterology   Lipoma of torso       Relevant Orders   Ambulatory referral to Dermatology      No orders of the defined types were placed in this encounter.   Follow-up: No follow-ups on file.   PLAN  BP improved on recheck  Continue meds as prescribed  Return in 6 mo  Referrals placed  Patient encouraged to call clinic with any questions, comments, or concerns.  Maximiano Coss, NP

## 2020-01-22 ENCOUNTER — Other Ambulatory Visit: Payer: Self-pay | Admitting: Registered Nurse

## 2020-01-22 DIAGNOSIS — I10 Essential (primary) hypertension: Secondary | ICD-10-CM

## 2020-01-22 MED ORDER — TELMISARTAN-HCTZ 80-12.5 MG PO TABS: 1.0000 | ORAL_TABLET | Freq: Every day | ORAL | 1 refills | Status: DC

## 2020-01-22 NOTE — Telephone Encounter (Signed)
Requested Prescriptions  Pending Prescriptions Disp Refills   telmisartan-hydrochlorothiazide (MICARDIS HCT) 80-12.5 MG tablet 90 tablet 1    Sig: Take 1 tablet by mouth daily.     Cardiovascular: ARB + Diuretic Combos Failed - 01/22/2020  3:48 PM      Failed - K in normal range and within 180 days    Potassium  Date Value Ref Range Status  06/23/2019 3.8 3.5 - 5.2 mmol/L Final         Failed - Na in normal range and within 180 days    Sodium  Date Value Ref Range Status  06/23/2019 141 134 - 144 mmol/L Final         Failed - Cr in normal range and within 180 days    Creat  Date Value Ref Range Status  12/20/2015 0.73 0.50 - 1.05 mg/dL Final    Comment:      For patients > or = 57 years of age: The upper reference limit for Creatinine is approximately 13% higher for people identified as African-American.      Creatinine, Ser  Date Value Ref Range Status  06/23/2019 1.01 (H) 0.57 - 1.00 mg/dL Final         Failed - Ca in normal range and within 180 days    Calcium  Date Value Ref Range Status  06/23/2019 9.6 8.7 - 10.2 mg/dL Final         Passed - Patient is not pregnant      Passed - Last BP in normal range    BP Readings from Last 1 Encounters:  01/20/20 136/82         Passed - Valid encounter within last 6 months    Recent Outpatient Visits          3 days ago Colon cancer screening   [facility], Delfino Lovett, NP   1 year ago Muscle spasm of left shoulder   [facility] [place], NP   2 years ago Prediabetes   [facility], Ranell Patrick, MD   2 years ago Pre-operative clearance   [facility] at [place], [place]   2 years ago Routine physical examination   Primary [facility] at [facility], Renette Butters, MD

## 2020-01-22 NOTE — Telephone Encounter (Signed)
telmisartan-hydrochlorothiazide (MICARDIS HCT) 80-12.5 MG tablet Medication Date: 12/20/2019 Department: [facility] at [place] Ordering/Authorizing: Maximiano Coss, NP   [place], [place] Phone:  [phone]  Fax:  [phone]     Pt is states she was just in and Orland Mustard was going to refill. Looks as another refill but she has been to [facility] to PU and they state she has no more refills... Pt is out of med.

## 2020-05-24 ENCOUNTER — Other Ambulatory Visit: Payer: Self-pay | Admitting: Registered Nurse

## 2020-05-24 DIAGNOSIS — I10 Essential (primary) hypertension: Secondary | ICD-10-CM

## 2020-05-27 ENCOUNTER — Other Ambulatory Visit: Payer: Self-pay | Admitting: Registered Nurse

## 2020-05-27 DIAGNOSIS — I10 Essential (primary) hypertension: Secondary | ICD-10-CM

## 2020-05-27 NOTE — Telephone Encounter (Signed)
Requested medication (s) are due for refill today:   Yes  Requested medication (s) are on the active medication list:   Yes  Future visit scheduled:   No  There is a note that he needs an appt before any more refills   Last ordered: 01/22/2020 #90, 1 refill    Returned because [place] scheduled their pts not the [facility]   Requested Prescriptions  Pending Prescriptions Disp Refills   telmisartan-hydrochlorothiazide (MICARDIS HCT) 80-12.5 MG tablet [Pharmacy Med Name: Telmisartan-HCTZ 80-12.5 MG Oral Tablet] 30 tablet 0    Sig: TAKE 1 TABLET BY MOUTH ONCE DAILY. *APPOINTMENT IS REQUIRED PRIOR TO FURTHER REFILLS*      Cardiovascular: ARB + Diuretic Combos Failed - 05/27/2020  3:05 PM      Failed - K in normal range and within 180 days    Potassium  Date Value Ref Range Status  06/23/2019 3.8 3.5 - 5.2 mmol/L Final          Failed - Na in normal range and within 180 days    Sodium  Date Value Ref Range Status  06/23/2019 141 134 - 144 mmol/L Final          Failed - Cr in normal range and within 180 days    Creat  Date Value Ref Range Status  12/20/2015 0.73 0.50 - 1.05 mg/dL Final    Comment:      For patients > or = 58 years of age: The upper reference limit for Creatinine is approximately 13% higher for people identified as African-American.      Creatinine, Ser  Date Value Ref Range Status  06/23/2019 1.01 (H) 0.57 - 1.00 mg/dL Final          Failed - Ca in normal range and within 180 days    Calcium  Date Value Ref Range Status  06/23/2019 9.6 8.7 - 10.2 mg/dL Final          Passed - Patient is not pregnant      Passed - Last BP in normal range    BP Readings from Last 1 Encounters:  01/20/20 136/82          Passed - Valid encounter within last 6 months    Recent Outpatient Visits           4 months ago Colon cancer screening   [facility], Delfino Lovett, NP   1 year ago Muscle spasm of left shoulder   [facility] at [place], NP   2 years ago Prediabetes   [facility], Ranell Patrick, MD   2 years ago Pre-operative clearance   Primary Care at [place], [place]   3 years ago Routine physical examination   Primary Care at [facility], Renette Butters, MD

## 2020-05-28 NOTE — Telephone Encounter (Signed)
Please schedule appt if available for med refill

## 2020-05-28 NOTE — Telephone Encounter (Signed)
No further refill without office visit. Please contact [facility] new location for any further refills after 05/31/20 at [phone]

## 2020-05-28 NOTE — Telephone Encounter (Signed)
05/28/2020 - PATIENT REQUESTING A REFILL ON HER TELMISARTAN-HYDROCHLOROTHIAZIDE. I TRIED TO CALL AND SCHEDULE AN OFFICE VISIT WITH RICH MORROW ON Friday BEFORE OUR OFFICE CLOSES BUT I HAD TO LEAVE A VOICE MAIL FOR HER TO RETURN MY CALL. I LEFT RICH'S NEW OFFICE INFORMATION FOR HER TO CALL THEIR OFFICE IF SHE COULD NOT COME IN HERE ON Friday 05/31/20. [employer]

## 2020-06-05 ENCOUNTER — Other Ambulatory Visit: Payer: Self-pay | Admitting: Orthopedic Surgery

## 2020-06-05 DIAGNOSIS — M25511 Pain in right shoulder: Secondary | ICD-10-CM

## 2020-06-05 DIAGNOSIS — R531 Weakness: Secondary | ICD-10-CM

## 2020-06-12 ENCOUNTER — Telehealth: Payer: Self-pay | Admitting: Cardiovascular Disease

## 2020-06-12 NOTE — Telephone Encounter (Signed)
Left message for patient to call and discuss scheduling the CTA chest/aorta ordered by Dr. [place] 

## 2020-06-20 NOTE — Telephone Encounter (Signed)
Left message for patient to call and discuss scheduling the CTA chest/aorta ordered by Dr. [place] 

## 2020-06-25 ENCOUNTER — Telehealth: Payer: Self-pay | Admitting: Cardiovascular Disease

## 2020-06-25 ENCOUNTER — Other Ambulatory Visit: Payer: Self-pay | Admitting: *Deleted

## 2020-06-25 DIAGNOSIS — Z01812 Encounter for preprocedural laboratory examination: Secondary | ICD-10-CM

## 2020-06-25 DIAGNOSIS — I712 Thoracic aortic aneurysm, without rupture, unspecified: Secondary | ICD-10-CM

## 2020-06-25 DIAGNOSIS — I7121 Aneurysm of the ascending aorta, without rupture: Secondary | ICD-10-CM

## 2020-06-25 NOTE — Telephone Encounter (Signed)
Left message for patient to call and discuss upcoming appointment for the CTA chest/aorta ordered by Dr. Oval Linsey.  Patient will need lab work 1 week prior to study.

## 2020-06-25 NOTE — Telephone Encounter (Signed)
4.19.22 LVM on cell to schedule fu appointment after CT w/Dr Oval Linsey or APP...LParmele

## 2020-07-03 NOTE — Telephone Encounter (Signed)
Left message for patient to call and discuss the Friday 07/19/20 1:00pm CTA chest/aorta appointment at [facility] [place]--[address], [address].  Patient will need to have lab work 1 week prior to her study.  Will continue to try and reach patient via phone.

## 2020-07-04 ENCOUNTER — Encounter: Payer: Self-pay | Admitting: Cardiovascular Disease

## 2020-07-04 NOTE — Telephone Encounter (Signed)
Left message for patient to call and discuss the Friday 07/19/20 1:00 CTA chest/aorta appointment scheduled at [facility] [place]--[address], [address]--arrival time is 12:45 pm for check in---patient will also need to have lab work done 1 week prior to exam.  I will mail information to patient, but will keep trying to reach via phone

## 2020-07-09 ENCOUNTER — Telehealth: Payer: Self-pay | Admitting: Cardiovascular Disease

## 2020-07-09 DIAGNOSIS — I10 Essential (primary) hypertension: Secondary | ICD-10-CM

## 2020-07-09 MED ORDER — TELMISARTAN-HCTZ 80-12.5 MG PO TABS: 1.0000 | ORAL_TABLET | Freq: Every day | ORAL | 0 refills | Status: DC

## 2020-07-09 NOTE — Telephone Encounter (Signed)
*  STAT* If patient is at the pharmacy, call can be transferred to refill team.   1. Which medications need to be refilled? (please list name of each medication and dose if known) telmisartan-hydrochlorothiazide (MICARDIS HCT) 80-12.5 MG tablet  2. Which pharmacy/location (including street and city if local pharmacy) is medication to be sent to? [place], [place]  3. Do they need a 30 day or 90 day supply? 90 day supply

## 2020-07-10 NOTE — Telephone Encounter (Signed)
Left message for patient to call and discuss the scheduled CTA chest/aorta on Friday 07/19/20 and having lab work done prior to the study

## 2020-07-15 ENCOUNTER — Telehealth: Payer: Self-pay | Admitting: Cardiovascular Disease

## 2020-07-15 NOTE — Telephone Encounter (Signed)
Left message for patient to calla nd discuss the Friday 07/19/20 1:00pm CTA chest/aorta at [facility] [place]--[address], [address].

## 2020-07-19 ENCOUNTER — Inpatient Hospital Stay: Admission: RE | Admit: 2020-07-19 | Payer: Managed Care, Other (non HMO) | Source: Ambulatory Visit

## 2020-08-16 ENCOUNTER — Ambulatory Visit (INDEPENDENT_AMBULATORY_CARE_PROVIDER_SITE_OTHER): Payer: Managed Care, Other (non HMO) | Admitting: Cardiology

## 2020-08-16 ENCOUNTER — Telehealth: Payer: Self-pay | Admitting: Cardiovascular Disease

## 2020-08-16 ENCOUNTER — Other Ambulatory Visit: Payer: Self-pay

## 2020-08-16 ENCOUNTER — Encounter: Payer: Self-pay | Admitting: Cardiology

## 2020-08-16 VITALS — BP 108/62 | HR 83 | Ht 63.5 in | Wt 228.6 lb

## 2020-08-16 DIAGNOSIS — Z01812 Encounter for preprocedural laboratory examination: Secondary | ICD-10-CM

## 2020-08-16 DIAGNOSIS — R072 Precordial pain: Secondary | ICD-10-CM

## 2020-08-16 DIAGNOSIS — R0789 Other chest pain: Secondary | ICD-10-CM

## 2020-08-16 DIAGNOSIS — I10 Essential (primary) hypertension: Secondary | ICD-10-CM | POA: Diagnosis not present

## 2020-08-16 DIAGNOSIS — I712 Thoracic aortic aneurysm, without rupture, unspecified: Secondary | ICD-10-CM

## 2020-08-16 MED ORDER — METOPROLOL TARTRATE 50 MG PO TABS: ORAL_TABLET | ORAL | 0 refills | Status: DC

## 2020-08-16 NOTE — Telephone Encounter (Signed)
Will await call back

## 2020-08-16 NOTE — Assessment & Plan Note (Signed)
Has been having symptoms for about 2 mos, went to ER in [place] (no records found), told it was not a heart attack. Has been intermittent since then and constant for the last two weeks. Worse when she lays down. Nonexertional. Tender to palpation. No clear relationship to food. Has felt cold but no fevers, no URI symptoms. Better if she lays on her right side and presses on her chest. Stationary, no radiation. No other associated symptoms.  Sensation is sharp, constant, hurts more when she takes a deep breath.   Discussed options. Given that she is planned for CTA, will add CT cardiac to definitively evaluate coronaries.

## 2020-08-16 NOTE — Progress Notes (Signed)
Cardiology Office Note:    Date:  08/16/2020   ID:  Audrey Farley, DOB 14-Jun-1960, MRN [id number]  PCP:  Maximiano Coss, NP  Cardiologist:  Skeet Latch, MD  Referring MD: Maximiano Coss, NP   CC: urgent visit for chest pain  History of Present Illness:    Audrey Farley is a 60 y.o. female with a hx of hypertension who is seen as an urgent add-on visit today for chest pain. She is a patient of Dr. Oval Linsey, who last saw her on 09/22/2019.   Today: She was told to come to the office today to see if someone would see her for her chest pain. She was added on today as an urgent visit. Prior notes with Dr. Oval Linsey, as prior testing, reviewed.  She reports a long history of intermittent chest pain. Her current issues started about two months ago, with sharp central chest pain. It was initially intermittent two months ago, but in the last few weeks it has been constant. It is sharp, nonradiating (though also occasionally with a separate sharp pain in the left lateral ribs), associated with heart flutters, worse with taking a deep breath or lying down at night. It is better (though not completely relieved) with tylenol, lying on her right side. She also has intermittent lightheadedness.  She reports that she has had episodes of similar chest pain in the past.  Two months (March/April 2022) ago she felt very lightheaded while working and she went to the ED in [place] (not in [place]) and was told she was not having a heart attack. At that time, they told her it could be muscular. Since then, the episodes come and go.   Most recently, she has been having constant, sharp chest pains for two weeks now in the same location. Correlating symptoms include shortness of breath, pain with deep inspiration, cough, and her heart feeling "fluttery." Her chest is tender to palpation. Also, she has been feeling cold. She endorses night sweats that she has had before the chest pain  developed.  While lying in bed, if she lies on her right side and holds her chest she has some relief. If she gets up after lying down for a while, she feels lightheaded. She denies eating spicy foods and does not eat late at night. She has not performed any recent heavy lifting. She has been taking tylenol, and this is helping her sleep.   Her surgical history includes 2 knee replacements. She has bilateral knee pain and swelling that she attributes to this.  She denies any headaches, syncope, orthopnea or PND.  We reviewed her prior cardiac tests, which were unremarkable.   Past Medical History:  Diagnosis Date   Allergy    Aneurysm of ascending aorta ([place]) 02/25/2016   3.9cm by echo 02/2016  -aorta enlarged   Arthritis    Chest pain 01/24/2016   Dyspnea on exertion    Hypertension    Urge incontinence     Past Surgical History:  Procedure Laterality Date   ABDOMINAL HYSTERECTOMY     still has cervix; DUB; ovaries intact.   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   TOTAL KNEE ARTHROPLASTY Left 09/28/2016   Procedure: LEFT TOTAL KNEE ARTHROPLASTY;  Surgeon: Vickey Huger, MD;  Location: [facility];  Service: Orthopedics;  Laterality: Left;    Current Medications: Current Outpatient Medications on File Prior to Visit  Medication Sig   albuterol (VENTOLIN HFA) 108 (90 Base) MCG/ACT inhaler albuterol  sulfate HFA 90 mcg/actuation aerosol inhaler   aspirin EC 81 MG tablet Take 1 tablet (81 mg total) by mouth daily.   betamethasone acetate-betamethasone sodium phosphate (CELESTONE) 6 (3-3) MG/ML injection Inject into the articular space.   celecoxib (CELEBREX) 200 MG capsule Take by mouth.   diltiazem (CARDIZEM CD) 120 MG 24 hr capsule Take 1 capsule (120 mg total) by mouth daily.   telmisartan-hydrochlorothiazide (MICARDIS HCT) 80-12.5 MG tablet Take 1 tablet by mouth daily.   No current facility-administered medications on file prior to visit.      Allergies:   No known allergies   Social History   Tobacco Use   Smoking status: Never   Smokeless tobacco: Never  Vaping Use   Vaping Use: Never used  Substance Use Topics   Alcohol use: Yes    Comment: occ vodka   Drug use: No    Family History: family history includes Diabetes in her mother and sister; Epilepsy in her brother; Heart attack in her mother; Heart disease in her mother; Hyperlipidemia in her mother and sister; Hypertension in her mother and sister; Kidney disease in her mother; Mental illness in her brother; Multiple sclerosis in her sister. There is no history of Sudden death.  ROS:   Please see the history of present illness.  Additional pertinent ROS: Constitutional: Positive for chills, night sweats. Negative for fever, unintentional weight loss  HENT: Positive for lightheadedness. Negative for ear pain and hearing loss.   Eyes: Negative for loss of vision and eye pain.  Respiratory: Positive for shortness of breath, pain with deep inspiration, cough. Negative for sputum, wheezing.   Cardiovascular: See HPI. Gastrointestinal: Negative for abdominal pain, melena, and hematochezia.  Genitourinary: Negative for dysuria and hematuria.  Musculoskeletal: Positive for bilateral knee pain and edema. Negative for falls and myalgias.  Skin: Negative for itching and rash.  Neurological: Negative for focal weakness, focal sensory changes and loss of consciousness.  Endo/Heme/Allergies: Does not bruise/bleed easily.     EKGs/Labs/Other Studies Reviewed:    The following studies were reviewed today:  Lexiscan Myoview 06/23/2019: Electrically nondiagnostic for ischemia Normal perfusion. No ischemia or scar LVEF 70% Low risk study.  Echo 06/23/2019: 1. Left ventricular ejection fraction, by estimation, is 60 to 65%. The  left ventricle has normal function. The left ventricle has no regional  wall motion abnormalities. There is mild concentric left ventricular   hypertrophy. Left ventricular diastolic  parameters are indeterminate.   2. Right ventricular systolic function is normal. The right ventricular  size is normal. There is normal pulmonary artery systolic pressure.   3. The mitral valve is normal in structure. Trivial mitral valve  regurgitation. No evidence of mitral stenosis.   4. The aortic valve is normal in structure. Aortic valve regurgitation is  not visualized. No aortic stenosis is present.   5. The inferior vena cava is normal in size with greater than 50%  respiratory variability, suggesting right atrial pressure of 3 mmHg.   EKG:  EKG is personally reviewed.   08/16/2020: NSR, inferiorQ in III/aVF. Similar to 2019 (2021 ECG had slightly different pattern, but most consistent with pattern today)  Recent Labs: No results found for requested labs within last 8760 hours.  Recent Lipid Panel    Component Value Date/Time   CHOL 218 (H) 06/23/2019 0850   TRIG 61 06/23/2019 0850   HDL 90 06/23/2019 0850   CHOLHDL 2.4 06/23/2019 0850   CHOLHDL 2.1 12/20/2015 1123   VLDL 9 12/20/2015 1123  LDLCALC 117 (H) 06/23/2019 0850    Physical Exam:    VS:  BP 108/62 (BP Location: Left Arm, Patient Position: Sitting, Cuff Size: Large)   Pulse 83   Ht 5' 3.5" (1.613 m)   Wt 228 lb 9.6 oz (103.7 kg)   SpO2 97%   BMI 39.86 kg/m     Wt Readings from Last 3 Encounters:  08/16/20 228 lb 9.6 oz (103.7 kg)  01/19/20 232 lb 6.4 oz (105.4 kg)  09/22/19 231 lb 12.8 oz (105.1 kg)    GEN: Well nourished, well developed in no acute distress HEENT: Normal, moist mucous membranes NECK: No JVD CARDIAC: regular rhythm, normal S1 and S2, no rubs or gallops. No murmur. Tender to palpation over mid sternum and left lateral ribs VASCULAR: Radial and DP pulses 2+ bilaterally. No carotid bruits RESPIRATORY:  Clear to auscultation without rales, wheezing or rhonchi  ABDOMEN: Soft, non-tender, non-distended MUSCULOSKELETAL:  Ambulates  independently SKIN: Warm and dry, no edema NEUROLOGIC:  Alert and oriented x 3. No focal neuro deficits noted. PSYCHIATRIC:  Normal affect    ASSESSMENT:    1. Atypical chest pain   2. Precordial pain   3. Pre-procedure lab exam   4. Essential hypertension   5. Thoracic aortic aneurysm without rupture ([place])    PLAN:    Atypical chest pain Has been having symptoms for about 2 mos, went to ER in [place] (no records found), told it was not a heart attack. Has been intermittent since then and constant for the last two weeks. Worse when she lays down. Nonexertional. Tender to palpation. No clear relationship to food. Has felt cold but no fevers, no URI symptoms. Better if she lays on her right side and presses on her chest. Stationary, no radiation. No other associated symptoms.  Sensation is sharp, constant, hurts more when she takes a deep breath.   Discussed options. Given that she is planned for CTA, will add CT cardiac to definitively evaluate coronaries.  -will have her hold her antihypertensives prior to test to allow room for metoprolol (in addition to diltiazem) for better heart rate control -recommend tylenol for pain -will check inflammatory markers and CBC to exclude inflammatory process  Hypertension: well controlled today, temporarily hold antihypertensives prior to CT to allow for rate control agents  Thoracic aortic aneurysm: will expand field of view with CT cardiac and get CT aorta at the same time  Cardiac risk counseling and prevention recommendations: -recommend heart healthy/Mediterranean diet, with whole grains, fruits, vegetable, fish, lean meats, nuts, and olive oil. Limit salt. -recommend moderate walking, 3-5 times/week for 30-50 minutes each session. Aim for at least 150 minutes.week. Goal should be pace of 3 miles/hours, or walking 1.5 miles in 30 minutes -recommend avoidance of tobacco products. Avoid excess alcohol. -ASCVD risk score: The 10-year ASCVD  risk score Mikey Bussing DC Brooke Bonito., et al., 2013) is: 2.7%   Values used to calculate the score:     Age: 58 years     Sex: Female     Is Non-Hispanic African American: Yes     Diabetic: No     Tobacco smoker: No     Systolic Blood Pressure: 779 mmHg     Is BP treated: Yes     HDL Cholesterol: 90 mg/dL     Total Cholesterol: 218 mg/dL    Plan for follow up:  2 months with Dr. Oval Linsey or sooner based on results of testing  Total time of encounter: 40 minutes total  time of encounter, including 29 minutes spent in face-to-face patient care. This time includes coordination of care and counseling regarding chest pain. Remainder of non-face-to-face time involved reviewing chart documents/testing relevant to the patient encounter and documentation in the medical record.  Buford Dresser, MD, PhD, [facility]  [facility]    Buford Dresser, MD, PhD, [facility]    Medication Adjustments/Labs and Tests Ordered: Current medicines are reviewed at length with the patient today.  Concerns regarding medicines are outlined above.  Orders Placed This Encounter  Procedures   CT CORONARY MORPH W/CTA COR W/SCORE W/CA W/CM &/OR WO/CM   Basic metabolic panel   CBC   C-reactive protein   Sed Rate (ESR)   EKG 12-Lead   Meds ordered this encounter  Medications   metoprolol tartrate (LOPRESSOR) 50 MG tablet    Sig: TAKE 1 TABLET 2 HR PRIOR TO CARDIAC PROCEDURE    Dispense:  1 tablet    Refill:  0    Patient Instructions  Medication Instructions:  Your Physician recommend you continue on your current medication as directed.    *If you need a refill on your cardiac medications before your next appointment, please call your pharmacy*   Lab Work: Your physician recommends lab work today ( BMP, CBC, ESR, CRP).  If you have labs (blood work) drawn today and your tests are completely normal, you will receive your results only by: [place] (if you have  MyChart) OR A paper copy in the mail If you have any lab test that is abnormal or we need to change your treatment, we will call you to review the results.   Testing/Procedures: Cardiac CT Angiography (CTA), is a special type of CT scan that uses a computer to produce multi-dimensional views of major blood vessels throughout the body. In CT angiography, a contrast material is injected through an IV to help visualize the blood vessels  [facility]   Follow-Up: At [facility], you and your health needs are our priority.  As part of our continuing mission to provide you with exceptional heart care, we have created designated Provider Care Teams.  These Care Teams include your primary Cardiologist (physician) and Advanced Practice Providers (APPs -  Physician Assistants and Nurse Practitioners) who all work together to provide you with the care you need, when you need it.  We recommend signing up for the patient portal called "MyChart".  Sign up information is provided on this After Visit Summary.  MyChart is used to connect with patients for Virtual Visits (Telemedicine).  Patients are able to view lab/test results, encounter notes, upcoming appointments, etc.  Non-urgent messages can be sent to your provider as well.   To learn more about what you can do with MyChart, go to [email or web address].    Your next appointment:   2 month(s) @ [address] [place], [place] [address]   The format for your next appointment:   In Person  Provider:   Skeet Latch, MD  Your cardiac CT will be scheduled at one of the below locations:   [facility] [address] [place], [place] [address] [phone]   If scheduled at [facility], please arrive at the [facility] main entrance (entrance A) of [facility] 30 minutes prior to test start time. Proceed to the [facility] Radiology Department (first floor) to check-in and test prep.  If  scheduled at [facility], please  arrive 15 mins early for check-in and test prep.  Please follow these instructions carefully (unless otherwise directed):  On the Night Before the Test: Be sure to Drink plenty of water. Do not consume any caffeinated/decaffeinated beverages or chocolate 12 hours prior to your test. Do not take any antihistamines 12 hours prior to your test.   On the Day of the Test: Drink plenty of water until 1 hour prior to the test. Do not eat any food 4 hours prior to the test. You may take your regular medications prior to the test.  Take metoprolol (Lopressor) 50 mg two hours prior to test. HOLD Telmisartan-Hydrochlorothiazide the day before and morning of the test. FEMALES- please wear underwire-free bra if available        After the Test: Drink plenty of water. After receiving IV contrast, you may experience a mild flushed feeling. This is normal. On occasion, you may experience a mild rash up to 24 hours after the test. This is not dangerous. If this occurs, you can take Benadryl 25 mg and increase your fluid intake. If you experience trouble breathing, this can be serious. If it is severe call 911 IMMEDIATELY. If it is mild, please call our office. If you take any of these medications: Glipizide/Metformin, Avandament, Glucavance, please do not take 48 hours after completing test unless otherwise instructed.   Once we have confirmed authorization from your insurance company, we will call you to set up a date and time for your test. Based on how quickly your insurance processes prior authorizations requests, please allow up to 4 weeks to be contacted for scheduling your Cardiac CT appointment. Be advised that routine Cardiac CT appointments could be scheduled as many as 8 weeks after your provider has ordered it.  For non-scheduling related questions, please contact the cardiac imaging nurse navigator should you have any  questions/concerns: Marchia Bond, Cardiac Imaging Nurse Navigator [name], Cardiac Imaging Nurse Navigator [facility] Direct Office Dial: [phone]   For scheduling needs, including cancellations and rescheduling, please call [place], [phone].    I,Mathew Stumpf,acting as a Education administrator for [employer], MD.,have documented all relevant documentation on the behalf of Buford Dresser, MD,as directed by  Buford Dresser, MD while in the presence of Buford Dresser, MD.  I, Buford Dresser, MD, have reviewed all documentation for this visit. The documentation on 08/16/20 for the exam, diagnosis, procedures, and orders are all accurate and complete.   Signed, Buford Dresser, MD PhD 08/16/2020 6:12 PM    [facility]

## 2020-08-16 NOTE — Patient Instructions (Signed)
Medication Instructions:  Your Physician recommend you continue on your current medication as directed.    *If you need a refill on your cardiac medications before your next appointment, please call your pharmacy*   Lab Work: Your physician recommends lab work today ( BMP, CBC, ESR, CRP).  If you have labs (blood work) drawn today and your tests are completely normal, you will receive your results only by: [place] (if you have MyChart) OR A paper copy in the mail If you have any lab test that is abnormal or we need to change your treatment, we will call you to review the results.   Testing/Procedures: Cardiac CT Angiography (CTA), is a special type of CT scan that uses a computer to produce multi-dimensional views of major blood vessels throughout the body. In CT angiography, a contrast material is injected through an IV to help visualize the blood vessels  [facility]   Follow-Up: At [facility], you and your health needs are our priority.  As part of our continuing mission to provide you with exceptional heart care, we have created designated Provider Care Teams.  These Care Teams include your primary Cardiologist (physician) and Advanced Practice Providers (APPs -  Physician Assistants and Nurse Practitioners) who all work together to provide you with the care you need, when you need it.  We recommend signing up for the patient portal called "MyChart".  Sign up information is provided on this After Visit Summary.  MyChart is used to connect with patients for Virtual Visits (Telemedicine).  Patients are able to view lab/test results, encounter notes, upcoming appointments, etc.  Non-urgent messages can be sent to your provider as well.   To learn more about what you can do with MyChart, go to [email or web address].    Your next appointment:   2 month(s) @ [address] [place], [place] [address]   The format for your next appointment:   In  Person  Provider:   Skeet Latch, MD  Your cardiac CT will be scheduled at one of the below locations:   [facility] [address] [place], [place] [address] [phone]   If scheduled at [facility], please arrive at the [facility] main entrance (entrance A) of [facility] 30 minutes prior to test start time. Proceed to the [facility] Radiology Department (first floor) to check-in and test prep.  If scheduled at [facility], please arrive 15 mins early for check-in and test prep.  Please follow these instructions carefully (unless otherwise directed):  On the Night Before the Test: Be sure to Drink plenty of water. Do not consume any caffeinated/decaffeinated beverages or chocolate 12 hours prior to your test. Do not take any antihistamines 12 hours prior to your test.   On the Day of the Test: Drink plenty of water until 1 hour prior to the test. Do not eat any food 4 hours prior to the test. You may take your regular medications prior to the test.  Take metoprolol (Lopressor) 50 mg two hours prior to test. HOLD Telmisartan-Hydrochlorothiazide the day before and morning of the test. FEMALES- please wear underwire-free bra if available        After the Test: Drink plenty of water. After receiving IV contrast, you may experience a mild flushed feeling. This is normal. On occasion, you may experience a mild rash up to 24 hours after the test. This is not dangerous. If this occurs, you can take Benadryl  25 mg and increase your fluid intake. If you experience trouble breathing, this can be serious. If it is severe call 911 IMMEDIATELY. If it is mild, please call our office. If you take any of these medications: Glipizide/Metformin, Avandament, Glucavance, please do not take 48 hours after completing test unless otherwise instructed.   Once we have confirmed authorization from your insurance company, we will call  you to set up a date and time for your test. Based on how quickly your insurance processes prior authorizations requests, please allow up to 4 weeks to be contacted for scheduling your Cardiac CT appointment. Be advised that routine Cardiac CT appointments could be scheduled as many as 8 weeks after your provider has ordered it.  For non-scheduling related questions, please contact the cardiac imaging nurse navigator should you have any questions/concerns: Marchia Bond, Cardiac Imaging Nurse Navigator [name], Cardiac Imaging Nurse Navigator [place] [facility] Direct Office Dial: [phone]   For scheduling needs, including cancellations and rescheduling, please call [place], [phone].

## 2020-08-16 NOTE — Telephone Encounter (Signed)
Pt called requesting a sooner appointment due to reporting she has been having pain in her chest. I advised her due to our current availability I am going to ask a series of questions in regards to her symptoms to report to a nurse so she will be called back for a possible work in if felt necessary. Patient stated she would have to callback after being advised of this and hung up. Please advise.

## 2020-08-17 LAB — CBC
Hematocrit: 35.3 % (ref 34.0–46.6)
Hemoglobin: 12 g/dL (ref 11.1–15.9)
MCH: 29.3 pg (ref 26.6–33.0)
MCHC: 34 g/dL (ref 31.5–35.7)
MCV: 86 fL (ref 79–97)
Platelets: 203 10*3/uL (ref 150–450)
RBC: 4.09 x10E6/uL (ref 3.77–5.28)
RDW: 12.1 % (ref 11.7–15.4)
WBC: 9.1 10*3/uL (ref 3.4–10.8)

## 2020-08-17 LAB — SEDIMENTATION RATE: Sed Rate: 25 mm/hr (ref 0–40)

## 2020-08-17 LAB — BASIC METABOLIC PANEL
BUN/Creatinine Ratio: 28 — ABNORMAL HIGH (ref 9–23)
BUN: 31 mg/dL — ABNORMAL HIGH (ref 6–24)
CO2: 24 mmol/L (ref 20–29)
Calcium: 9.6 mg/dL (ref 8.7–10.2)
Chloride: 102 mmol/L (ref 96–106)
Creatinine, Ser: 1.11 mg/dL — ABNORMAL HIGH (ref 0.57–1.00)
Glucose: 87 mg/dL (ref 65–99)
Potassium: 4.3 mmol/L (ref 3.5–5.2)
Sodium: 141 mmol/L (ref 134–144)
eGFR: 58 mL/min/{1.73_m2} — ABNORMAL LOW (ref >59)

## 2020-08-17 LAB — C-REACTIVE PROTEIN: CRP: 5 mg/L (ref 0–10)

## 2020-08-20 ENCOUNTER — Telehealth: Payer: Self-pay | Admitting: Cardiology

## 2020-08-20 NOTE — Telephone Encounter (Signed)
This RN again attempted to call patient, call went to voicemail. This RN left nonspecific voicemail for patient to return office's call.

## 2020-08-20 NOTE — Telephone Encounter (Signed)
This RN attempted to call patient back, call went to voicemail. This RN left nonspecific message for patient to return office's call.

## 2020-08-20 NOTE — Telephone Encounter (Signed)
Follow Up:       Pt is returning call,concerning her lab results.  Please call after 2:30 .

## 2020-08-20 NOTE — Telephone Encounter (Signed)
Pt is returning call in regards to Lab work results. Please advise

## 2020-08-21 ENCOUNTER — Telehealth (HOSPITAL_COMMUNITY): Payer: Self-pay | Admitting: *Deleted

## 2020-08-21 NOTE — Telephone Encounter (Signed)
Reaching out to patient to offer assistance regarding upcoming cardiac imaging study; pt verbalizes understanding of appt date/time, parking situation and where to check in, pre-test NPO status and medications ordered, and verified current allergies; name and call back number provided for further questions should they arise  Audrey Clement RN Navigator Cardiac [place] [facility] [phone] office [phone] cell  Pt to hold Micardis on Wednesday and thursing evening. Pt will continue to take her daily cardizem nightly and will take 50mg  metoprolol tartrate 2 hours prior to scan.

## 2020-08-23 ENCOUNTER — Inpatient Hospital Stay: Admission: RE | Admit: 2020-08-23 | Payer: Managed Care, Other (non HMO) | Source: Ambulatory Visit

## 2020-08-23 ENCOUNTER — Other Ambulatory Visit: Payer: Self-pay

## 2020-08-23 ENCOUNTER — Ambulatory Visit (HOSPITAL_COMMUNITY)
Admission: RE | Admit: 2020-08-23 | Discharge: 2020-08-23 | Disposition: A | Discharge status: Home / Self Care | Payer: Managed Care, Other (non HMO) | Source: Ambulatory Visit | Attending: Cardiology | Admitting: Cardiology

## 2020-08-23 ENCOUNTER — Ambulatory Visit (HOSPITAL_COMMUNITY): Admission: RE | Admit: 2020-08-23 | Payer: Managed Care, Other (non HMO) | Source: Ambulatory Visit

## 2020-08-23 DIAGNOSIS — R072 Precordial pain: Secondary | ICD-10-CM

## 2020-08-23 DIAGNOSIS — I712 Thoracic aortic aneurysm, without rupture, unspecified: Secondary | ICD-10-CM

## 2020-08-23 DIAGNOSIS — I7121 Aneurysm of the ascending aorta, without rupture: Secondary | ICD-10-CM

## 2020-08-23 MED ORDER — NITROGLYCERIN 0.4 MG SL SUBL
0.8000 mg | SUBLINGUAL_TABLET | Freq: Once | SUBLINGUAL | Status: AC
  Administered 2020-08-23: 0.8 mg via SUBLINGUAL

## 2020-08-23 MED ORDER — IOHEXOL 350 MG/ML SOLN
100.0000 mL | Freq: Once | INTRAVENOUS | Status: AC | PRN
  Administered 2020-08-23: 100 mL via INTRAVENOUS

## 2020-08-23 MED ORDER — NITROGLYCERIN 0.4 MG SL SUBL
SUBLINGUAL_TABLET | SUBLINGUAL | Status: AC
  Filled 2020-08-23: qty 2

## 2020-08-27 NOTE — Telephone Encounter (Signed)
Pt called wanting to know Cardiac CTA results. Nurse informed pt that once test is reviewed by MD, nurse will call. Pt verbalized understanding.

## 2020-08-27 NOTE — Telephone Encounter (Signed)
I called patient, LVM advising we were following up on the phone call. If she did not receive her lab work to call back, but she had received them she did not have to call back unless she had questions.  Left call back number.

## 2020-08-27 NOTE — Telephone Encounter (Signed)
    Pt is returning call, he is confused if he did or did not receive lab result. He said if someone can give him a call to give the information

## 2020-08-29 NOTE — Telephone Encounter (Signed)
Pt updated with Cardiac CTA results and verbalized understanding.

## 2020-08-29 NOTE — Telephone Encounter (Signed)
Patient calling back for results. Thanks!

## 2020-08-29 NOTE — Telephone Encounter (Signed)
Patient states she is returning a call to go over her CT results.

## 2020-09-02 DIAGNOSIS — T8484XA Pain due to internal orthopedic prosthetic devices, implants and grafts, initial encounter: Secondary | ICD-10-CM | POA: Insufficient documentation

## 2020-09-02 DIAGNOSIS — Z96652 Presence of left artificial knee joint: Secondary | ICD-10-CM | POA: Insufficient documentation

## 2020-09-03 ENCOUNTER — Telehealth: Payer: Self-pay | Admitting: Cardiovascular Disease

## 2020-09-03 MED ORDER — DILTIAZEM HCL ER COATED BEADS 120 MG PO CP24: 120.0000 mg | ORAL_CAPSULE | Freq: Every day | ORAL | 3 refills | Status: DC

## 2020-09-03 NOTE — Telephone Encounter (Signed)
*  STAT* If patient is at the pharmacy, call can be transferred to refill team.   1. Which medications need to be refilled? (please list name of each medication and dose if known)  diltiazem (CARDIZEM CD) 120 MG 24 hr capsule (Expired)  2. Which pharmacy/location (including street and city if local pharmacy) is medication to be sent to? [place], [place] [address] [phone]  3. Do they need a 30 day or 90 day supply? 90 day supply

## 2020-09-03 NOTE — Telephone Encounter (Signed)
Pt is calling in regards to a new medicine Cyclovenzaprine 10 mg 1 tablet by mouth 3X per day that was prescribed by Dr. Dennison Mascot MD at [facility], Pt wants to know if it is safe to take along with her other meds. Please advise pt further

## 2020-09-03 NOTE — Telephone Encounter (Signed)
Called patient, advised of message from PharmD.  Patient also making sure she is okay to wait until 09/13 for her next appointment with Dr.Dublin. She seen Dr.Christopher and she suggested 2 month follow up which would be August.  I advised I did not know if there was anything sooner, but to keep her scheduled appointment for now until we heard back from [facility]. Patient verbalized understanding.

## 2020-09-03 NOTE — Telephone Encounter (Signed)
She should be fine to take the cyclobenzaprine.  Just let her know it will probably make her drowsy

## 2020-09-06 ENCOUNTER — Telehealth: Payer: Self-pay | Admitting: *Deleted

## 2020-09-06 DIAGNOSIS — Z79899 Other long term (current) drug therapy: Secondary | ICD-10-CM

## 2020-09-06 DIAGNOSIS — E785 Hyperlipidemia, unspecified: Secondary | ICD-10-CM

## 2020-09-06 MED ORDER — ROSUVASTATIN CALCIUM 20 MG PO TABS: 20.0000 mg | ORAL_TABLET | Freq: Every day | ORAL | 3 refills | Status: DC

## 2020-09-06 NOTE — Telephone Encounter (Signed)
Pt advised of medication update, Crestor 20 mg was send into pt [facility] in [place], Lipid and CMP ordered and mailed to pt to get blood work done in 2-3 months

## 2020-09-06 NOTE — Telephone Encounter (Signed)
-----   Message from Skeet Latch, MD sent at 09/03/2020  3:26 PM EDT ----- Thank you Bridgette.  Rip Harbour, can we please start her on rosuvastatin 20mg .  Repeat lipids/CMP in 2-3 months.

## 2020-09-11 ENCOUNTER — Ambulatory Visit: Payer: Managed Care, Other (non HMO) | Admitting: Medical

## 2020-09-20 ENCOUNTER — Other Ambulatory Visit: Payer: Managed Care, Other (non HMO)

## 2020-10-14 ENCOUNTER — Other Ambulatory Visit (INDEPENDENT_AMBULATORY_CARE_PROVIDER_SITE_OTHER): Payer: Self-pay | Admitting: Vascular Surgery

## 2020-10-14 ENCOUNTER — Other Ambulatory Visit (INDEPENDENT_AMBULATORY_CARE_PROVIDER_SITE_OTHER): Payer: Self-pay | Admitting: Nurse Practitioner

## 2020-10-14 DIAGNOSIS — R609 Edema, unspecified: Secondary | ICD-10-CM

## 2020-10-14 DIAGNOSIS — R6889 Other general symptoms and signs: Secondary | ICD-10-CM

## 2020-10-15 ENCOUNTER — Ambulatory Visit (INDEPENDENT_AMBULATORY_CARE_PROVIDER_SITE_OTHER): Payer: Managed Care, Other (non HMO)

## 2020-10-15 ENCOUNTER — Ambulatory Visit (INDEPENDENT_AMBULATORY_CARE_PROVIDER_SITE_OTHER): Payer: Managed Care, Other (non HMO) | Admitting: Vascular Surgery

## 2020-10-15 ENCOUNTER — Other Ambulatory Visit: Payer: Self-pay

## 2020-10-15 ENCOUNTER — Encounter (INDEPENDENT_AMBULATORY_CARE_PROVIDER_SITE_OTHER): Payer: Self-pay | Admitting: Vascular Surgery

## 2020-10-15 VITALS — BP 139/86 | HR 70 | Resp 19 | Ht 63.5 in | Wt 224.0 lb

## 2020-10-15 DIAGNOSIS — R6889 Other general symptoms and signs: Secondary | ICD-10-CM

## 2020-10-15 DIAGNOSIS — M79609 Pain in unspecified limb: Secondary | ICD-10-CM | POA: Insufficient documentation

## 2020-10-15 DIAGNOSIS — M79604 Pain in right leg: Secondary | ICD-10-CM

## 2020-10-15 DIAGNOSIS — E785 Hyperlipidemia, unspecified: Secondary | ICD-10-CM

## 2020-10-15 DIAGNOSIS — I712 Thoracic aortic aneurysm, without rupture: Secondary | ICD-10-CM

## 2020-10-15 DIAGNOSIS — R609 Edema, unspecified: Secondary | ICD-10-CM

## 2020-10-15 DIAGNOSIS — I10 Essential (primary) hypertension: Secondary | ICD-10-CM

## 2020-10-15 DIAGNOSIS — M79605 Pain in left leg: Secondary | ICD-10-CM

## 2020-10-15 DIAGNOSIS — I7121 Aneurysm of the ascending aorta, without rupture: Secondary | ICD-10-CM

## 2020-10-15 NOTE — Assessment & Plan Note (Signed)
ABIs today demonstrate normal triphasic waveforms and digital pressures bilaterally with a right ABI of 1.14 and a left ABI of 1.25 consistent with no arterial insufficiency.  Her pain sounds largely musculoskeletal in nature related to her knees, but given the significant swelling in the legs I do think it is appropriate to perform a venous reflux study as well to see if this may be a contributing factor.  This will be done in the near future at her convenience.  We will see her back following the study to discuss the results and determine further treatment options.

## 2020-10-15 NOTE — Assessment & Plan Note (Signed)
Was measured at 3.9 cm on echo several years ago which would technically be borderline for an aneurysm.  CT of the chest can be performed in the future for further evaluation if desired.

## 2020-10-15 NOTE — Progress Notes (Signed)
Patient ID: Audrey Farley, female   DOB: 05-20-1960, 60 y.o.   MRN: [id number]  Chief Complaint  Patient presents with   New Patient (Initial Visit)    HPI Audrey Farley is a 60 y.o. female.  I am asked to see the patient by [facility] evaluation of leg Farley and swelling.  The patient has been having trouble with this for many months and potentially years.  She has had previous knee replacement issues with orthopedic Farley.  She is having a lot of Farley around her knees and radiating down into the calves bilaterally.  This is associated with significant swelling.  She denies any open wounds or infection.  No fevers or chills.  Says the left leg may be the more severely affected the 2 legs, but both legs are bothersome.   Past Medical History:  Diagnosis Date   Allergy    Aneurysm of ascending aorta ([place]) 02/25/2016   3.9cm by echo 02/2016  -aorta enlarged   Arthritis    Chest Farley 01/24/2016   Dyspnea on exertion    Hypertension    Urge incontinence     Past Surgical History:  Procedure Laterality Date   ABDOMINAL HYSTERECTOMY     still has cervix; DUB; ovaries intact.   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   TOTAL KNEE ARTHROPLASTY Left 09/28/2016   Procedure: LEFT TOTAL KNEE ARTHROPLASTY;  Surgeon: Vickey Huger, MD;  Location: [facility];  Service: Orthopedics;  Laterality: Left;     Family History  Problem Relation Age of Onset   Diabetes Mother    Hypertension Mother    Heart attack Mother    Hyperlipidemia Mother    Heart disease Mother        heart problems; no AMI   Kidney disease Mother    Hyperlipidemia Sister    Hypertension Sister    Multiple sclerosis Sister    Mental illness Brother    Epilepsy Brother    Diabetes Sister    Sudden death Neg Hx       Social History   Tobacco Use   Smoking status: Never   Smokeless tobacco: Never  Vaping Use   Vaping Use: Never used  Substance Use Topics   Alcohol  use: Yes    Comment: occ vodka   Drug use: No     Allergies  Allergen Reactions   No Known Allergies     Current Outpatient Medications  Medication Sig Dispense Refill   albuterol (VENTOLIN HFA) 108 (90 Base) MCG/ACT inhaler albuterol sulfate HFA 90 mcg/actuation aerosol inhaler     aspirin EC 81 MG tablet Take 1 tablet (81 mg total) by mouth daily. 90 tablet 3   cyclobenzaprine (FLEXERIL) 10 MG tablet Take by mouth.     diltiazem (CARDIZEM CD) 120 MG 24 hr capsule Take 1 capsule (120 mg total) by mouth daily. 90 capsule 3   metoprolol tartrate (LOPRESSOR) 50 MG tablet TAKE 1 TABLET 2 HR PRIOR TO CARDIAC PROCEDURE 1 tablet 0   rosuvastatin (CRESTOR) 20 MG tablet Take 1 tablet (20 mg total) by mouth daily. 90 tablet 3   telmisartan-hydrochlorothiazide (MICARDIS HCT) 80-12.5 MG tablet Take 1 tablet by mouth daily. 90 tablet 0   betamethasone acetate-betamethasone sodium phosphate (CELESTONE) 6 (3-3) MG/ML injection Inject into the articular space. (Patient not taking: Reported on 10/15/2020)     No current facility-administered medications for this visit.      REVIEW OF SYSTEMS (Negative  unless checked)  Constitutional: _0 Weight loss  _1 Fever  _2 Chills Cardiac: _3 Chest Farley   _4 Chest pressure   _5 Palpitations   _6 Shortness of breath when laying flat   _7 Shortness of breath at rest   _8 Shortness of breath with exertion. Vascular:  _9 Farley in legs with walking   _10 Farley in legs at rest   _11 Farley in legs when laying flat   _12 Claudication   _13 Farley in feet when walking  _14 Farley in feet at rest  _15 Farley in feet when laying flat   _16 History of DVT   _17 Phlebitis   _18 Swelling in legs   _19 Varicose veins   _20 Non-healing ulcers Pulmonary:   _21 Uses home oxygen   _22 Productive cough   _23 Hemoptysis   _24 Wheeze  _25 COPD   _26 Asthma Neurologic:  _27 Dizziness  _28 Blackouts   _29 Seizures   _30 History of stroke   _31 History of TIA  _32 Aphasia   _33 Temporary blindness   _34 Dysphagia   _35 Weakness or numbness in arms    _36 Weakness or numbness in legs Musculoskeletal:  _37 Arthritis   _38 Joint swelling   _39 Joint Farley   _40 Low back Farley Hematologic:  _41 Easy bruising  _42 Easy bleeding   _43 Hypercoagulable state   _44 Anemic  _45 Hepatitis Gastrointestinal:  _46 Blood in stool   _47 Vomiting blood  _48 Gastroesophageal reflux/heartburn   _49 Abdominal Farley Genitourinary:  _50 Chronic kidney disease   _51 Difficult urination  _52 Frequent urination  _53 Burning with urination   _54 Hematuria Skin:  _55 Rashes   _56 Ulcers   _57 Wounds Psychological:  _58 History of anxiety   _59  History of major depression.    Physical Exam BP 139/86 (BP Location: Right Arm)   Pulse 70   Resp 19   Ht 5' 3.5" (1.613 m)   Wt 224 lb (101.6 kg)   BMI 39.06 kg/m  Gen:  WD/WN, NAD Head: [place]/AT, No temporalis wasting.  Ear/Nose/Throat: Hearing grossly intact, nares w/o erythema or drainage, oropharynx w/o Erythema/Exudate Eyes: Conjunctiva clear, sclera non-icteric  Neck: trachea midline.  No JVD.  Pulmonary:  Good air movement, respirations not labored, no use of accessory muscles  Cardiac: RRR, no JVD Vascular:  Vessel Right Left  Radial Palpable Palpable                          PT 1+ 1+  DP 2+ 2+   Gastrointestinal:. No masses, surgical incisions, or scars. Musculoskeletal: M/S 5/5 throughout.  Extremities without ischemic changes.  No deformity or atrophy. 1+ BLE edema. Neurologic: Sensation grossly intact in extremities.  Symmetrical.  Speech is fluent. Motor exam as listed above. Psychiatric: Judgment intact, Mood & affect appropriate for pt's clinical situation. Dermatologic: No rashes or ulcers noted.  No cellulitis or open wounds.    Radiology No results found.  Labs Recent Results (from the past 2160 hour(s))  Basic metabolic panel     Status: Abnormal   Collection Time: 08/16/20  3:17 PM  Result Value Ref Range   Glucose 87 65 - 99 mg/dL   BUN 31 (H) 6 - 24 mg/dL   Creatinine, Ser 1.11 (H) 0.57 - 1.00 mg/dL   eGFR 58 (L) >59  mL/min/1.73   BUN/Creatinine Ratio 28 (H) 9 - 23   Sodium 141 134 - 144 mmol/L   Potassium 4.3 3.5 - 5.2 mmol/L   Chloride 102 96 - 106 mmol/L   CO2 24 20 - 29 mmol/L   Calcium 9.6 8.7 - 10.2 mg/dL  CBC     Status: None   Collection Time: 08/16/20  3:17 PM  Result Value  Ref Range   WBC 9.1 3.4 - 10.8 x10E3/uL   RBC 4.09 3.77 - 5.28 x10E6/uL   Hemoglobin 12.0 11.1 - 15.9 g/dL   Hematocrit 35.3 34.0 - 46.6 %   MCV 86 79 - 97 fL   MCH 29.3 26.6 - 33.0 pg   MCHC 34.0 31.5 - 35.7 g/dL   RDW 12.1 11.7 - 15.4 %   Platelets 203 150 - 450 x10E3/uL  C-reactive protein     Status: None   Collection Time: 08/16/20  3:17 PM  Result Value Ref Range   CRP 5 0 - 10 mg/L  Sed Rate (ESR)     Status: None   Collection Time: 08/16/20  3:17 PM  Result Value Ref Range   Sed Rate 25 0 - 40 mm/hr    Assessment/Plan:  Essential hypertension blood pressure control important in reducing the progression of atherosclerotic disease. On appropriate oral medications.   Hyperlipidemia lipid control important in reducing the progression of atherosclerotic disease. Continue statin therapy   Farley in limb ABIs today demonstrate normal triphasic waveforms and digital pressures bilaterally with a right ABI of 1.14 and a left ABI of 1.25 consistent with no arterial insufficiency.  Her Farley sounds largely musculoskeletal in nature related to her knees, but given the significant swelling in the legs I do think it is appropriate to perform a venous reflux study as well to see if this may be a contributing factor.  This will be done in the near future at her convenience.  We will see her back following the study to discuss the results and determine further treatment options.  Aneurysm of ascending aorta ([facility]) Was measured at 3.9 cm on echo several years ago which would technically be borderline for an aneurysm.  CT of the chest can be performed in the future for further evaluation if desired.      Audrey Farley 10/15/2020, 5:04 PM   This note was created with Dragon medical transcription system.  Any errors from dictation are unintentional.

## 2020-10-15 NOTE — Assessment & Plan Note (Signed)
blood pressure control important in reducing the progression of atherosclerotic disease. On appropriate oral medications.  

## 2020-10-15 NOTE — Assessment & Plan Note (Signed)
lipid control important in reducing the progression of atherosclerotic disease. Continue statin therapy  

## 2020-10-28 ENCOUNTER — Telehealth (INDEPENDENT_AMBULATORY_CARE_PROVIDER_SITE_OTHER): Payer: Self-pay | Admitting: Nurse Practitioner

## 2020-10-28 ENCOUNTER — Ambulatory Visit (INDEPENDENT_AMBULATORY_CARE_PROVIDER_SITE_OTHER): Payer: Managed Care, Other (non HMO) | Admitting: Nurse Practitioner

## 2020-10-28 ENCOUNTER — Encounter (INDEPENDENT_AMBULATORY_CARE_PROVIDER_SITE_OTHER): Payer: Managed Care, Other (non HMO)

## 2020-10-28 NOTE — Telephone Encounter (Signed)
Patient left vm 8/19 wanting to get an earlier appointment for Monday 8/22.  Left patient a vm stating we didn't have anything earlier.

## 2020-10-30 ENCOUNTER — Ambulatory Visit (INDEPENDENT_AMBULATORY_CARE_PROVIDER_SITE_OTHER): Payer: Managed Care, Other (non HMO)

## 2020-10-30 ENCOUNTER — Encounter (INDEPENDENT_AMBULATORY_CARE_PROVIDER_SITE_OTHER): Payer: Self-pay | Admitting: Nurse Practitioner

## 2020-10-30 ENCOUNTER — Ambulatory Visit (INDEPENDENT_AMBULATORY_CARE_PROVIDER_SITE_OTHER): Payer: Managed Care, Other (non HMO) | Admitting: Nurse Practitioner

## 2020-10-30 ENCOUNTER — Other Ambulatory Visit: Payer: Self-pay

## 2020-10-30 VITALS — BP 173/99 | HR 57 | Ht 63.0 in | Wt 226.0 lb

## 2020-10-30 DIAGNOSIS — E785 Hyperlipidemia, unspecified: Secondary | ICD-10-CM

## 2020-10-30 DIAGNOSIS — M79605 Pain in left leg: Secondary | ICD-10-CM | POA: Diagnosis not present

## 2020-10-30 DIAGNOSIS — M79604 Pain in right leg: Secondary | ICD-10-CM | POA: Diagnosis not present

## 2020-10-30 DIAGNOSIS — I10 Essential (primary) hypertension: Secondary | ICD-10-CM

## 2020-10-30 DIAGNOSIS — I83893 Varicose veins of bilateral lower extremities with other complications: Secondary | ICD-10-CM

## 2020-10-30 NOTE — Progress Notes (Signed)
Subjective:    Patient ID: Audrey Farley, female    DOB: 07-19-1960, 60 y.o.   MRN: [id number] Chief Complaint  Patient presents with  . Follow-up    Pt conv. BLE ven reflux    Audrey Farley is a 60 y.o. female that returns today for noninvasive studies regarding her lower extremity edema.  The patient previously had ABIs done due to concern for previous abnormal ABIs.  ABIs done in office were normal.  The patient describes a throbbing aching pain when sitting around her knee area.  But when she is standing and walking it is a sharp pain.  Her left is worse than her right.  This pain does tend to be concentrated around her knee area.  She also notes some lower back pain with the last 3 months or so and its worst when she is lying down at night.  The pain is constantly radiating.  The patient also has extensive lower extremity edema.  She denies any open wounds or ulcerations she denies any fevers or chills.  Today right lower extremity has reflux in the great saphenous vein at the saphenofemoral junction.  The left has deep venous insufficiency in the left common femoral vein with reflux in the great saphenous vein at the mid thigh.  There is also noted Baker's cyst behind the right knee measuring 4.98 cm x 1 cm x 4.5 cm.  There is no evidence of DVT or superficial thrombophlebitis seen bilaterally.  Review of Systems  Cardiovascular:  Positive for leg swelling.  Musculoskeletal:  Positive for arthralgias, back pain, gait problem and myalgias.  All other systems reviewed and are negative.     Objective:   Physical Exam Vitals reviewed.  HENT:     Head: Normocephalic.  Cardiovascular:     Rate and Rhythm: Normal rate.     Pulses: Normal pulses.  Pulmonary:     Effort: Pulmonary effort is normal.  Skin:    General: Skin is warm and dry.  Neurological:     Mental Status: She is alert and oriented to person, place, and time.     Motor: Weakness present.     Gait: Gait  abnormal.  Psychiatric:        Mood and Affect: Mood normal.        Behavior: Behavior normal.        Thought Content: Thought content normal.        Judgment: Judgment normal.    BP (!) 173/99   Pulse (!) 57   Ht '5\' 3"'$  (1.6 m)   Wt 226 lb (102.5 kg)   BMI 40.03 kg/m   Past Medical History:  Diagnosis Date  . Allergy   . Aneurysm of ascending aorta ([place]) 02/25/2016   3.9cm by echo 02/2016  -aorta enlarged  . Arthritis   . Chest pain 01/24/2016  . Dyspnea on exertion   . Hypertension   . Urge incontinence     Social History   Socioeconomic History  . Marital status: Single    Spouse name: Not on file  . Number of children: Not on file  . Years of education: Not on file  . Highest education level: Not on file  Occupational History  . Not on file  Tobacco Use  . Smoking status: Never  . Smokeless tobacco: Never  Vaping Use  . Vaping Use: Never used  Substance and Sexual Activity  . Alcohol use: Yes    Comment: occ vodka  . Drug  use: No  . Sexual activity: Yes    Birth control/protection: Surgical, Post-menopausal  Other Topics Concern  . Not on file  Social History Narrative   Marital status: single; not dating since 2014      Children: 2 daughters (16, 37); no grandchildren      Lives: with 2 daughters       Employment: tobacco company; Stage manager x 16 years; loves job!  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        Tobacco: none      Alcohol; weekends/socially      Drugs: none      Exercise:  Two days per week; cardio      Seatbelt: 100%; no texting      Sexual activity: not sexually active since 2014; dates males; no STDs; total partners = 7.   Social Determinants of Health   Financial Resource Strain: Not on file  Food Insecurity: Not on file  Transportation Needs: Not on file  Physical Activity: Not on file  Stress: Not on file  Social Connections: Not on file  Intimate Partner Violence: Not on file    Past  Surgical History:  Procedure Laterality Date  . ABDOMINAL HYSTERECTOMY     still has cervix; DUB; ovaries intact.  [facility] KNEE ARTHROSCOPY Left 2012  . 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 TOTAL KNEE ARTHROPLASTY Left 09/28/2016   Procedure: LEFT TOTAL KNEE ARTHROPLASTY;  Surgeon: Vickey Huger, MD;  Location: [facility];  Service: Orthopedics;  Laterality: Left;    Family History  Problem Relation Age of Onset  . Diabetes Mother   . Hypertension Mother   . Heart attack Mother   . Hyperlipidemia Mother   . Heart disease Mother        heart problems; no AMI  . Kidney disease Mother   . Hyperlipidemia Sister   . Hypertension Sister   . Multiple sclerosis Sister   . Mental illness Brother   . Epilepsy Brother   . Diabetes Sister   . Sudden death Neg Hx     Allergies  Allergen Reactions  . Celecoxib Nausea Only  . Mirabegron Nausea Only  . No Known Allergies     CBC Latest Ref Rng & Units 08/16/2020 06/23/2019 12/03/2017  WBC 3.4 - 10.8 x10E3/uL 9.1 6.3 6.9  Hemoglobin 11.1 - 15.9 g/dL 12.0 12.8 12.2  Hematocrit 34.0 - 46.6 % 35.3 36.7 36.7  Platelets 150 - 450 x10E3/uL 203 154 213      CMP     Component Value Date/Time   NA 141 08/16/2020 1517   K 4.3 08/16/2020 1517   CL 102 08/16/2020 1517   CO2 24 08/16/2020 1517   GLUCOSE 87 08/16/2020 1517   GLUCOSE 155 (H) 09/29/2016 0311   BUN 31 (H) 08/16/2020 1517   CREATININE 1.11 (H) 08/16/2020 1517   CREATININE 0.73 12/20/2015 1123   CALCIUM 9.6 08/16/2020 1517   PROT 6.6 06/23/2019 0850   ALBUMIN 4.4 06/23/2019 0850   AST 15 06/23/2019 0850   ALT 12 06/23/2019 0850   ALKPHOS 112 06/23/2019 0850   BILITOT 0.3 06/23/2019 0850   GFRNONAA 62 06/23/2019 0850   GFRNONAA 77 08/04/2013 0939   GFRAA 72 06/23/2019 0850   GFRAA 89 08/04/2013 0939     VAS [place] ABI WITH/WO TBI  Result Date: 10/25/2020  LOWER EXTREMITY DOPPLER STUDY Patient Name:  Audrey Farley  Date of Exam:   10/15/2020 Medical Rec #: [id number]  Accession #:    [id number] Date of Birth: [birth year]          Patient Gender: F Patient Age:   82 years Exam Location:  [facility] Procedure:      VAS [place] ABI WITH/WO TBI Referring Phys: JASON DEW --------------------------------------------------------------------------------  Indications: Rest pain.  Performing Technologist: Charlane Ferretti RT (R)(VS)  Examination Guidelines: A complete evaluation includes at minimum, Doppler waveform signals and systolic blood pressure reading at the level of bilateral brachial, anterior tibial, and posterior tibial arteries, when vessel segments are accessible. Bilateral testing is considered an integral part of a complete examination. Photoelectric Plethysmograph (PPG) waveforms and toe systolic pressure readings are included as required and additional duplex testing as needed. Limited examinations for reoccurring indications may be performed as noted.  ABI Findings: +---------+------------------+-----+---------+----------+ Right    Rt Pressure (mmHg)IndexWaveform Comment    +---------+------------------+-----+---------+----------+ Brachial 124                                        +---------+------------------+-----+---------+----------+ ATA      139                    triphasicIndex 1.12 +---------+------------------+-----+---------+----------+ PTA      148               1.14 triphasic           +---------+------------------+-----+---------+----------+ Great Toe107               0.82 Normal              +---------+------------------+-----+---------+----------+ +---------+------------------+-----+---------+---------+ Left     Lt Pressure (mmHg)IndexWaveform Comment   +---------+------------------+-----+---------+---------+ Brachial 130                                       +---------+------------------+-----+---------+---------+ ATA      156                    triphasicIndex 1.2  +---------+------------------+-----+---------+---------+ PTA      163               1.25 triphasic          +---------+------------------+-----+---------+---------+ Great Toe155               1.19 Normal             +---------+------------------+-----+---------+---------+ Summary: Right: Resting right ankle-brachial index is within normal range. No evidence of significant right lower extremity arterial disease. The right toe-brachial index is normal. Left: Resting left ankle-brachial index is within normal range. No evidence of significant left lower extremity arterial disease. The left toe-brachial index is normal. *See table(s) above for measurements and observations.  Electronically signed by Leotis Pain MD on 10/25/2020 at 10:10:52 AM.    Final        Assessment & Plan:   1. Pain in both lower extremities Based on the patient's description of pain I suspect it is mostly musculoskeletal in nature.  The patient recently had ABIs which were normal.  I have suggested to the patient to discuss possible further evaluation of her hips with her orthopedic doctor.  It is also possible due to some pain and issues with her back that this could also be a area causing her discomfort as well.  2. Varicose veins  of bilateral lower extremities with other complications The patient does have small amount of reflux in the left lower extremity however given her description of pain I suspect that this is not related entirely to her varicose veins.  The patient does have some ongoing orthopedic issues that may be causing some of her pain and discomfort symptoms.  We will have the patient continue with conservative therapy for the next 3 months to determine if this helps to alleviate her symptoms at all to distinguish whether endovenous ablation may be helpful for the patient.  3. Essential hypertension Continue antihypertensive medications as already ordered, these medications have been reviewed and there are no  changes at this time.   4. Hyperlipidemia, unspecified hyperlipidemia type Continue statin as ordered and reviewed, no changes at this time    Current Outpatient Medications on File Prior to Visit  Medication Sig Dispense Refill  . albuterol (VENTOLIN HFA) 108 (90 Base) MCG/ACT inhaler albuterol sulfate HFA 90 mcg/actuation aerosol inhaler    . aspirin EC 81 MG tablet Take 1 tablet (81 mg total) by mouth daily. 90 tablet 3  . betamethasone acetate-betamethasone sodium phosphate (CELESTONE) 6 (3-3) MG/ML injection Inject into the articular space.    . cyclobenzaprine (FLEXERIL) 10 MG tablet Take by mouth.    . diltiazem (CARDIZEM CD) 120 MG 24 hr capsule Take 1 capsule (120 mg total) by mouth daily. 90 capsule 3  . metoprolol tartrate (LOPRESSOR) 50 MG tablet TAKE 1 TABLET 2 HR PRIOR TO CARDIAC PROCEDURE 1 tablet 0  . rosuvastatin (CRESTOR) 20 MG tablet Take 1 tablet (20 mg total) by mouth daily. 90 tablet 3  . telmisartan-hydrochlorothiazide (MICARDIS HCT) 80-12.5 MG tablet Take 1 tablet by mouth daily. 90 tablet 0   No current facility-administered medications on file prior to visit.    There are no Patient Instructions on file for this visit. No follow-ups on file.   Kris Hartmann, NP

## 2020-11-19 ENCOUNTER — Ambulatory Visit (INDEPENDENT_AMBULATORY_CARE_PROVIDER_SITE_OTHER): Payer: Managed Care, Other (non HMO) | Admitting: Cardiovascular Disease

## 2020-11-19 ENCOUNTER — Encounter (HOSPITAL_BASED_OUTPATIENT_CLINIC_OR_DEPARTMENT_OTHER): Payer: Self-pay | Admitting: Cardiovascular Disease

## 2020-11-19 ENCOUNTER — Other Ambulatory Visit: Payer: Self-pay

## 2020-11-19 VITALS — BP 130/78 | HR 61 | Ht 63.0 in | Wt 223.7 lb

## 2020-11-19 DIAGNOSIS — R0600 Dyspnea, unspecified: Secondary | ICD-10-CM

## 2020-11-19 DIAGNOSIS — I2510 Atherosclerotic heart disease of native coronary artery without angina pectoris: Secondary | ICD-10-CM | POA: Diagnosis not present

## 2020-11-19 DIAGNOSIS — Z5181 Encounter for therapeutic drug level monitoring: Secondary | ICD-10-CM | POA: Diagnosis not present

## 2020-11-19 DIAGNOSIS — I10 Essential (primary) hypertension: Secondary | ICD-10-CM

## 2020-11-19 DIAGNOSIS — R0609 Other forms of dyspnea: Secondary | ICD-10-CM

## 2020-11-19 DIAGNOSIS — K219 Gastro-esophageal reflux disease without esophagitis: Secondary | ICD-10-CM

## 2020-11-19 HISTORY — DX: Atherosclerotic heart disease of native coronary artery without angina pectoris: I25.10

## 2020-11-19 LAB — COMPREHENSIVE METABOLIC PANEL
ALT: 17 IU/L (ref 0–32)
AST: 11 IU/L (ref 0–40)
Albumin/Globulin Ratio: 1.6 (ref 1.2–2.2)
Albumin: 4.2 g/dL (ref 3.8–4.9)
Alkaline Phosphatase: 101 IU/L (ref 44–121)
BUN/Creatinine Ratio: 16 (ref 9–23)
BUN: 16 mg/dL (ref 6–24)
Bilirubin Total: 0.4 mg/dL (ref 0.0–1.2)
CO2: 23 mmol/L (ref 20–29)
Calcium: 9.5 mg/dL (ref 8.7–10.2)
Chloride: 103 mmol/L (ref 96–106)
Creatinine, Ser: 0.99 mg/dL (ref 0.57–1.00)
Globulin, Total: 2.6 g/dL (ref 1.5–4.5)
Glucose: 94 mg/dL (ref 65–99)
Potassium: 4 mmol/L (ref 3.5–5.2)
Sodium: 141 mmol/L (ref 134–144)
Total Protein: 6.8 g/dL (ref 6.0–8.5)
eGFR: 66 mL/min/{1.73_m2} (ref >59)

## 2020-11-19 LAB — LIPID PANEL
Chol/HDL Ratio: 1.9 ratio (ref 0.0–4.4)
Cholesterol, Total: 184 mg/dL (ref 100–199)
HDL: 95 mg/dL (ref >39)
LDL Chol Calc (NIH): 79 mg/dL (ref 0–99)
Triglycerides: 51 mg/dL (ref 0–149)
VLDL Cholesterol Cal: 10 mg/dL (ref 5–40)

## 2020-11-19 MED ORDER — PANTOPRAZOLE SODIUM 40 MG PO TBEC: 40.0000 mg | DELAYED_RELEASE_TABLET | Freq: Every day | ORAL | 1 refills | Status: DC

## 2020-11-19 NOTE — Assessment & Plan Note (Signed)
She has exertional dyspnea.  However I think this is due to obesity and deconditioning.  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  She is going to have surgery on her knee later this year.  Hopefully with this she will be able to increase her exercise and work on weight loss.

## 2020-11-19 NOTE — Progress Notes (Signed)
Cardiology Office Note   Date:  11/19/2020   ID:  Audrey Farley, DOB [birth year], MRN [id number]  PCP:  Audrey Bon, NP  Cardiologist:   Skeet Latch, MD   No chief complaint on file.    History of Present Illness: Audrey Farley is a 60 y.o. female with hypertension who presents for follow up.  She saw Dr. Reginia Farley on 12/2015 and reported chest pain and shortness of breath.  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  She was referred for an exercise Myoview that revealed LVEF 53% with no ischemia.  She achieved 8 METS on the Bruce protocol.  She also had an echo 02/2016 that showed LVEF 55-60% and mild dilation of the ascending aorta (3.9 cm).  She had a stress 06/2019  And LVEF was 70%.  There was no ischemia. Her BP was high so she started diltiazem '120mg'$  daily. She had a repeat echocardiogram 06/2019 that revealed LVEF 60 to 65% with mild LVH.  Diastolic function was indeterminate.  Her ascending aorta was normal in size.  Her prior echocardiogram in 2017 revealed an ascending aorta of 3.9 cm.   She saw Dr. Harrell Farley on 08/2020 as an acute visit for chest pain. Her symptoms were non-exertional and atypical. She had a coronary CT that revealed minimal non-obstructive disease. Her calcium score was 96th percentile. 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 She was started on rosuvastatin 09/2020.   Today, she is accompanied by her daughter. At home her blood pressure has lately been averaging in the 120s-130s. Previously her blood pressure has been averaging over 150-160. She is feeling some chest pain every now and then, not as frequently as before. Normally she notices her pain in her central chest at night while lying down. Last week, she developed acute heartburn but has not tried any medication. She believes her heartburn may have been caused by eating peppermint. A lot of walking, frequently moving objects, and climbing  stairs causes her to be short of breath. In December, she is scheduled for surgery on her prior left knee replacement due to shifting hardware. Her knee pain has prevented her from participating in formal exercise. Currently she does not have access to water aerobics. She endorses constant bilateral LE edema that improves while lying down at night. However, her left LE is persistently swollen. Additionally, she has an area of tenderness in her upper back.  She denies any palpitations, lightheadedness, headaches, syncope, orthopnea, or PND. She is fasting this morning.   Past Medical History:  Diagnosis Date   Allergy    Aneurysm of ascending aorta (Audrey Farley) 02/25/2016   3.9cm by echo 02/2016  -aorta enlarged   Arthritis    CAD in native artery 11/19/2020   Chest pain 01/24/2016   Dyspnea on exertion    Hypertension    Urge incontinence     Past Surgical History:  Procedure Laterality Date   ABDOMINAL HYSTERECTOMY     still has cervix; DUB; ovaries intact.   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   TOTAL KNEE ARTHROPLASTY Left 09/28/2016   Procedure: LEFT TOTAL KNEE ARTHROPLASTY;  Surgeon: Vickey Huger, MD;  Location: [place];  Service: Orthopedics;  Laterality: Left;     Current Outpatient Medications  Medication Sig Dispense Refill   albuterol (VENTOLIN HFA) 108 (90 Base) MCG/ACT inhaler albuterol sulfate HFA 90 mcg/actuation aerosol inhaler     aspirin  EC 81 MG tablet Take 1 tablet (81 mg total) by mouth daily. 90 tablet 3   diltiazem (CARDIZEM CD) 120 MG 24 hr capsule Take 1 capsule (120 mg total) by mouth daily. 90 capsule 3   pantoprazole (PROTONIX) 40 MG tablet Take 1 tablet (40 mg total) by mouth daily. 90 tablet 1   rosuvastatin (CRESTOR) 20 MG tablet Take 1 tablet (20 mg total) by mouth daily. 90 tablet 3   telmisartan-hydrochlorothiazide (MICARDIS HCT) 80-12.5 MG tablet Take 1 tablet by mouth daily. 90 tablet 0   No current facility-administered  medications for this visit.    Allergies:   Celecoxib, Mirabegron, and No known allergies    Social History:  The patient  reports that she has never smoked. She has never used smokeless tobacco. She reports current alcohol use. She reports that she does not use drugs.   Family History:  The patient's family history includes Diabetes in her mother and sister; Epilepsy in her brother; Heart attack in her mother; Heart disease in her mother; Hyperlipidemia in her mother and sister; Hypertension in her mother and sister; Kidney disease in her mother; Mental illness in her brother; Multiple sclerosis in her sister.    ROS:   Please see the history of present illness. (+) Central chest pain (+) Shortness of breath (+) Left knee pain (+) Bilateral LE edema, persistent in left LE (+) Heartburn All other systems are reviewed and negative.    PHYSICAL EXAM: VS:  BP 130/78 (BP Location: Right Arm, Patient Position: Sitting)   Pulse 61   Ht '5\' 3"'$  (1.6 m)   Wt 223 lb 11.2 oz (101.5 kg)   SpO2 99%   BMI 39.63 kg/m  , BMI Body mass index is 39.63 kg/m. GENERAL:  Well appearing HEENT: Pupils equal round and reactive, fundi not visualized, oral mucosa unremarkable NECK:  No jugular venous distention, waveform within normal limits, carotid upstroke brisk and symmetric, no bruits, no thyromegaly LUNGS:  Clear to auscultation bilaterally HEART:  RRR.  PMI not displaced or sustained,S1 and S2 within normal limits, no S3, no S4, no clicks, no rubs, no murmurs ABD:  Flat, positive bowel sounds normal in frequency in pitch, no bruits, no rebound, no guarding, no midline pulsatile mass, no hepatomegaly, no splenomegaly EXT:  2 plus pulses throughout, no edema, no cyanosis no clubbing SKIN:  No rashes no nodules. Lipoma on upper back-tender to palpation  NEURO:  Cranial nerves II through XII grossly intact, motor grossly intact throughout [facility]:  Cognitively intact, oriented to person place and  time   EKG:  11/19/2020: EKG is not ordered today. 09/22/2019: EKG was not ordered. 11/26/17: Sinus bradycardia.  Rate 56 bpm.  Prior inferior infarct 01/24/16: sinus bradycardia.  Non-specific T wave abnormalities.   ABI 10/15/2020: Summary:  Right: Resting right ankle-brachial index is within normal range. No  evidence of significant right lower extremity arterial disease. The right  toe-brachial index is normal.   Left: Resting left ankle-brachial index is within normal range. No  evidence of significant left lower extremity arterial disease. The left  toe-brachial index is normal.   CT Angio Chest and Aorta 08/23/2020: IMPRESSION: 1. No evidence of thoracic aortic aneurysm or dissection. 2. Aortic atherosclerosis, in addition to left main and 2 vessel coronary artery disease. Please note that although the presence of coronary artery calcium documents the presence of coronary artery disease, the severity of this disease and any potential stenosis cannot be assessed on this non-gated CT  examination. Assessment for potential risk factor modification, dietary therapy or pharmacologic therapy may be warranted, if clinically indicated. 3. Mild cardiomegaly.   Aortic Atherosclerosis (ICD10-I70.0).  Coronary CT  08/23/2020: IMPRESSION: 1. Coronary calcium score of 206. This was 96th percentile for age, sex, and race matched control.   2. Normal coronary origin with right dominance.   3. CAD-RADS 1. Minimal non-obstructive CAD (1-24%). Consider non-atherosclerotic causes of chest pain. Consider preventive therapy and risk factor modification.   4. There is evidence of distal left main dilation into the proximal LAD, maximal diameter 10.4 mm. Diameter meets criteria for aneurysm.  Lexiscan Myoview 06/23/2019: Electrically nondiagnostic for ischemia Normal perfusion. No ischemia or scar LVEF 70% Low risk study.  Echo 06/23/2019:  1. Left ventricular ejection fraction, by  estimation, is 60 to 65%. The  left ventricle has normal function. The left ventricle has no regional  wall motion abnormalities. There is mild concentric left ventricular  hypertrophy. Left ventricular diastolic  parameters are indeterminate.   2. Right ventricular systolic function is normal. The right ventricular  size is normal. There is normal pulmonary artery systolic pressure.   3. The mitral valve is normal in structure. Trivial mitral valve  regurgitation. No evidence of mitral stenosis.   4. The aortic valve is normal in structure. Aortic valve regurgitation is  not visualized. No aortic stenosis is present.   5. The inferior vena cava is normal in size with greater than 50%  respiratory variability, suggesting right atrial pressure of 3 mmHg.   Echo 02/21/16: Study Conclusions - Left ventricle: The cavity size was normal. There was mild   concentric hypertrophy. Systolic function was normal. The   estimated ejection fraction was in the range of 55% to 60%. Wall   motion was normal; there were no regional wall motion   abnormalities. - Aortic valve: Trileaflet; normal thickness, mildly calcified   leaflets. - Aorta: Ascending aortic diameter: 39 mm (S). - Aortic root: The aortic root was normal in size. - Ascending aorta: The ascending aorta was mildly dilated. - Mitral valve: There was trivial regurgitation. - Tricuspid valve: There was trivial regurgitation.  Exercise Myoview 02/14/16: The left ventricular ejection fraction is mildly decreased (45-54%). Nuclear stress EF: 53%. Blood pressure demonstrated a hypertensive response to exercise. Baseline EKG showed NSR with diffuse ST/T wave abnormality in the inferolateral leads. During stress there was 38m J point depression from baseline with horizontal ST segment depression in the inferolateral leads. EKG nondiagnostic due to resting EKG changes. The study is normal. This is a low risk study.   Recent Labs: 08/16/2020:  BUN 31; Creatinine, Ser 1.11; Hemoglobin 12.0; Platelets 203; Potassium 4.3; Sodium 141    Lipid Panel    Component Value Date/Time   CHOL 218 (H) 06/23/2019 0850   TRIG 61 06/23/2019 0850   HDL 90 06/23/2019 0850   CHOLHDL 2.4 06/23/2019 0850   CHOLHDL 2.1 12/20/2015 1123   VLDL 9 12/20/2015 1123   LDLCALC 117 (H) 06/23/2019 0850      Wt Readings from Last 3 Encounters:  11/19/20 223 lb 11.2 oz (101.5 kg)  10/30/20 226 lb (102.5 kg)  10/15/20 224 lb (101.6 kg)      ASSESSMENT AND PLAN: Essential hypertension Blood pressure was initially elevated but better on repeat.  She has difficulty exercising due to leg pain.  She is going to work on trying to get into water aerobics and going to [employer]  Continue telmisartan/HCTZ and diltiazem.  CAD in  native artery She was noted to have minimal CAD on cardiac CT 08/2020.  She was started on rosuvastatin 09/2020.  Repeat we will check lipids and a CMP today.  Her LDL goal is less than 70.  Continue aspirin.  DOE (dyspnea on exertion) She has exertional dyspnea.  However I think this is due to obesity and deconditioning.  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  She is going to have surgery on her knee later this year.  Hopefully with this she will be able to increase her exercise and work on weight loss.  GERD (gastroesophageal reflux disease) Will start pantoprazole '40mg'$  daily.  Avoid eating late, red sauces, spicy food and peppermint.    Current medicines are reviewed at length with the patient today.  The patient does not have concerns regarding medicines.  The following changes have been made:  start pantoprazole  Labs/ tests ordered today include:   Orders Placed This Encounter  Procedures   Lipid panel   Comprehensive metabolic panel      Disposition:   FU with Esbeidy Mclaine C. Oval Linsey, MD, [facility] in 1 year   I,Mathew Stumpf,acting as a scribe for Skeet Latch, MD.,have documented all relevant documentation on the behalf of Skeet Latch, MD,as directed by  Skeet Latch, MD while in the presence of Skeet Latch, MD.  I, [place] Oval Linsey, MD have reviewed all documentation for this visit.  The documentation of the exam, diagnosis, procedures, and orders on 11/19/2020 are all accurate and complete.   Signed, Shanetra Blumenstock C. Oval Linsey, MD, [facility]  11/19/2020 9:59 AM    [facility]

## 2020-11-19 NOTE — Assessment & Plan Note (Signed)
She was noted to have minimal CAD on cardiac CT 08/2020.  She was started on rosuvastatin 09/2020.  Repeat we will check lipids and a CMP today.  Her LDL goal is less than 70.  Continue aspirin.

## 2020-11-19 NOTE — Assessment & Plan Note (Signed)
Blood pressure was initially elevated but better on repeat.  She has difficulty exercising due to leg pain.  She is going to work on trying to get into water aerobics and going to [employer].  Continue telmisartan/HCTZ and diltiazem.

## 2020-11-19 NOTE — Assessment & Plan Note (Signed)
Will start pantoprazole '40mg'$  daily.  Avoid eating late, red sauces, spicy food and peppermint.

## 2020-11-19 NOTE — Patient Instructions (Addendum)
Medication Instructions:  START PANTOPRAZOLE 40 MG DAILY  *If you need a refill on your cardiac medications before your next appointment, please call your pharmacy*  Lab Work: LP/CMET TODAY   If you have labs (blood work) drawn today and your tests are completely normal, you will receive your results only by: [place] (if you have MyChart) OR A paper copy in the mail If you have any lab test that is abnormal or we need to change your treatment, we will call you to review the results.  Testing/Procedures: NONE   Follow-Up: At [facility], you and your health needs are our priority.  As part of our continuing mission to provide you with exceptional heart care, we have created designated Provider Care Teams.  These Care Teams include your primary Cardiologist (physician) and Advanced Practice Providers (APPs -  Physician Assistants and Nurse Practitioners) who all work together to provide you with the care you need, when you need it.  We recommend signing up for the patient portal called "MyChart".  Sign up information is provided on this After Visit Summary.  MyChart is used to connect with patients for Virtual Visits (Telemedicine).  Patients are able to view lab/test results, encounter notes, upcoming appointments, etc.  Non-urgent messages can be sent to your provider as well.   To learn more about what you can do with MyChart, go to [email or web address].    Your next appointment:   12 month(s)  The format for your next appointment:   In Person  Provider:   Skeet Latch, MD

## 2021-01-15 ENCOUNTER — Other Ambulatory Visit: Payer: Self-pay

## 2021-01-15 ENCOUNTER — Emergency Department (HOSPITAL_COMMUNITY): Payer: Managed Care, Other (non HMO)

## 2021-01-15 ENCOUNTER — Emergency Department (HOSPITAL_COMMUNITY)
Admission: EM | Admit: 2021-01-15 | Discharge: 2021-01-16 | Disposition: A | Discharge status: Home / Self Care | Payer: Managed Care, Other (non HMO) | Attending: Emergency Medicine | Admitting: Emergency Medicine

## 2021-01-15 ENCOUNTER — Encounter (HOSPITAL_COMMUNITY): Payer: Self-pay

## 2021-01-15 DIAGNOSIS — Z7982 Long term (current) use of aspirin: Secondary | ICD-10-CM | POA: Diagnosis not present

## 2021-01-15 DIAGNOSIS — M542 Cervicalgia: Secondary | ICD-10-CM | POA: Diagnosis present

## 2021-01-15 DIAGNOSIS — M79601 Pain in right arm: Secondary | ICD-10-CM | POA: Insufficient documentation

## 2021-01-15 DIAGNOSIS — R202 Paresthesia of skin: Secondary | ICD-10-CM | POA: Diagnosis not present

## 2021-01-15 DIAGNOSIS — Z79899 Other long term (current) drug therapy: Secondary | ICD-10-CM | POA: Insufficient documentation

## 2021-01-15 DIAGNOSIS — Z96652 Presence of left artificial knee joint: Secondary | ICD-10-CM | POA: Diagnosis not present

## 2021-01-15 DIAGNOSIS — I10 Essential (primary) hypertension: Secondary | ICD-10-CM | POA: Diagnosis not present

## 2021-01-15 DIAGNOSIS — M79602 Pain in left arm: Secondary | ICD-10-CM | POA: Insufficient documentation

## 2021-01-15 DIAGNOSIS — I2510 Atherosclerotic heart disease of native coronary artery without angina pectoris: Secondary | ICD-10-CM | POA: Diagnosis not present

## 2021-01-15 MED ORDER — HYDROCODONE-ACETAMINOPHEN 5-325 MG PO TABS
1.0000 | ORAL_TABLET | Freq: Once | ORAL | Status: AC
Start: 2021-01-15 — End: 2021-01-15
  Administered 2021-01-15: 1 via ORAL
  Filled 2021-01-15: qty 1

## 2021-01-15 NOTE — ED Notes (Signed)
Patient transported to MRI 

## 2021-01-15 NOTE — ED Triage Notes (Signed)
Pt. Arrived POV c/o bilateral arm pain that started 2 weeks ago. She decided to come in because her arm pain has gotten increasingly worse and is unrelieved. She also says that she is experiencing blood in her stool and dark colored urine.

## 2021-01-15 NOTE — ED Provider Notes (Signed)
[facility] DEPT Provider Note   CSN: [id number] Arrival date & time: 01/15/21  1750     History Chief Complaint  Patient presents with   Arm Pain    Audrey Farley is a 60 y.o. female.  The history is provided by the patient and medical records.  Arm Pain  60 y.o. F with hx of arthritis, HTN, HLP, vitamin D deficiency, GERD, presenting to the ED with neck pain and bilateral arm pain.  Patient states over the past 2 and half weeks she has been having bilateral arm pain and difficulty with range of motion.  States this is progressively worsening, now to the point that she is unable to lift up her arms or complete her usual ADLs.  States she has been having some neck pain, this is intermittent and often worse in the morning.  Along with this she also has intermittent numbness and tingling down both of her arms.  Her right side does seem slightly worse than the left.  She is predominantly right-handed.  She has not had any falls or trauma.  No prior surgeries.  She has not had any unexplained fevers or weight loss.  She has no history of cancer.  Past Medical History:  Diagnosis Date   Allergy    Aneurysm of ascending aorta 02/25/2016   3.9cm by echo 02/2016  -aorta enlarged   Arthritis    CAD in native artery 11/19/2020   Chest pain 01/24/2016   Dyspnea on exertion    Hypertension    Urge incontinence     Patient Active Problem List   Diagnosis Date Noted   CAD in native artery 11/19/2020   Hyperlipidemia 10/15/2020   Pain in limb 10/15/2020   Pain due to total left knee replacement ([facility]) 09/02/2020   Pes anserine bursitis 01/19/2020   Atypical chest pain 06/02/2019   DOE (dyspnea on exertion) 06/02/2019   Abnormal finding on EKG 06/02/2019   Essential hypertension 06/02/2019   Knee joint replaced by other means 09/28/2016   Primary osteoarthritis of left knee 08/19/2016   Vitamin D deficiency 08/08/2016   Aneurysm of ascending aorta  02/25/2016   GERD (gastroesophageal reflux disease)    Allergic rhinitis    Allergy    Urge incontinence     Past Surgical History:  Procedure Laterality Date   ABDOMINAL HYSTERECTOMY     still has cervix; DUB; ovaries intact.   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   TOTAL KNEE ARTHROPLASTY Left 09/28/2016   Procedure: LEFT TOTAL KNEE ARTHROPLASTY;  Surgeon: Vickey Huger, MD;  Location: [facility];  Service: Orthopedics;  Laterality: Left;     OB History   No obstetric history on file.     Family History  Problem Relation Age of Onset   Diabetes Mother    Hypertension Mother    Heart attack Mother    Hyperlipidemia Mother    Heart disease Mother        heart problems; no AMI   Kidney disease Mother    Hyperlipidemia Sister    Hypertension Sister    Multiple sclerosis Sister    Mental illness Brother    Epilepsy Brother    Diabetes Sister    Sudden death Neg Hx     Social History   Tobacco Use   Smoking status: Never   Smokeless tobacco: Never  Vaping Use   Vaping Use: Never used  Substance Use Topics   Alcohol use:  Yes    Comment: occ vodka   Drug use: No    Home Medications Prior to Admission medications   Medication Sig Start Date End Date Taking? Authorizing Provider  albuterol (VENTOLIN HFA) 108 (90 Base) MCG/ACT inhaler Inhale 2 puffs into the lungs every 4 (four) hours as needed for wheezing or shortness of breath.   Yes [provider]  aspirin EC 81 MG tablet Take 1 tablet (81 mg total) by mouth daily. 06/02/19  Yes Kilroy, Luke K, PA-C  atorvastatin (LIPITOR) 20 MG tablet Take 20 mg by mouth at bedtime. 01/06/21  Yes [provider]  celecoxib (CELEBREX) 200 MG capsule Take 200 mg by mouth 2 (two) times daily.   Yes [provider]  cholecalciferol (VITAMIN D3) 25 MCG (1000 UNIT) tablet Take 1,000 Units by mouth daily.   Yes [provider]  ciprofloxacin-hydrocortisone (CIPRO HC OTIC)  OTIC suspension Place 3 drops into the right ear 2 (two) times daily.   Yes [provider]  diltiazem (CARDIZEM CD) 120 MG 24 hr capsule Take 1 capsule (120 mg total) by mouth daily. 09/03/20 01/15/21 Yes Skeet Latch, MD  folic acid (FOLVITE) 1 MG tablet Take 1 mg by mouth daily.   Yes [provider]  HYDROcodone-acetaminophen (NORCO/VICODIN) 5-325 MG tablet Take 1 tablet by mouth every 4 (four) hours as needed. 01/16/21  Yes Larene Pickett, PA-C  methylPREDNISolone (MEDROL DOSEPAK) 4 MG TBPK tablet Follow package instructions. 01/16/21  Yes Larene Pickett, PA-C  telmisartan-hydrochlorothiazide (MICARDIS HCT) 80-12.5 MG tablet Take 1 tablet by mouth daily. 07/09/20  Yes Skeet Latch, MD  vitamin B-12 (CYANOCOBALAMIN) 1000 MCG tablet Take 1,000 mcg by mouth daily.   Yes [provider]  pantoprazole (PROTONIX) 40 MG tablet Take 1 tablet (40 mg total) by mouth daily. Patient not taking: No sig reported 11/19/20   Skeet Latch, MD  rosuvastatin (CRESTOR) 20 MG tablet Take 1 tablet (20 mg total) by mouth daily. Patient not taking: No sig reported 09/06/20 01/15/21  Skeet Latch, MD    Allergies    Celecoxib, Mirabegron, and No known allergies  Review of Systems   Review of Systems  Musculoskeletal:  Positive for arthralgias and neck pain.  All other systems reviewed and are negative.  Physical Exam Updated Vital Signs BP 107/79 (BP Location: Left Arm)   Pulse 64   Temp 97.8 F (36.6 C) (Oral)   Resp 16   Ht 5\' 3"  (1.6 m)   Wt 95.3 kg   SpO2 98%   BMI 37.20 kg/m   Physical Exam Vitals and nursing note reviewed.  Constitutional:      Appearance: She is well-developed.  HENT:     Head: Normocephalic and atraumatic.  Eyes:     Conjunctiva/sclera: Conjunctivae normal.     Pupils: Pupils are equal, round, and reactive to light.  Cardiovascular:     Rate and Rhythm: Normal rate and regular rhythm.     Heart sounds: Normal heart sounds.   Pulmonary:     Effort: Pulmonary effort is normal.     Breath sounds: Normal breath sounds.  Abdominal:     General: Bowel sounds are normal.     Palpations: Abdomen is soft.  Musculoskeletal:        General: Normal range of motion.     Cervical back: Normal range of motion.     Comments: Both arms normal in appearance without deformity or swelling, radial pulses intact BLE, no wrist drop, equal grips  albeit somewhat weaker than baseline secondary to pain, unable to lift her arms and significant height without rolling trunk forward or lifting one arm with the other  Skin:    General: Skin is warm and dry.  Neurological:     Mental Status: She is alert and oriented to person, place, and time.    ED Results / Procedures / Treatments   Labs (all labs ordered are listed, but only abnormal results are displayed) Labs Reviewed - No data to display  EKG None  Radiology CT Head Wo Contrast  Result Date: 01/15/2021 CLINICAL DATA:  bilateral arm pain that started 2 weeks ago. She decided to come in because her arm pain has gotten increasingly worse and is unrelieved. EXAM: CT HEAD WITHOUT CONTRAST CT CERVICAL SPINE WITHOUT CONTRAST TECHNIQUE: Multidetector CT imaging of the head and cervical spine was performed following the standard protocol without intravenous contrast. Multiplanar CT image reconstructions of the cervical spine were also generated. COMPARISON:  None. FINDINGS: CT HEAD FINDINGS Brain: No evidence of large-territorial acute infarction. No parenchymal hemorrhage. No mass lesion. No extra-axial collection. No mass effect or midline shift. No hydrocephalus. Basilar cisterns are patent. Vascular: No hyperdense vessel. Skull: No acute fracture or focal lesion. Sinuses/Orbits: Paranasal sinuses and mastoid air cells are clear. The orbits are unremarkable. Other: None. CT CERVICAL SPINE FINDINGS Alignment: Normal. Skull base and vertebrae: Multilevel severe degenerative changes with  multilevel severe osseous neural foraminal stenosis. No severe osseous central canal stenosis. No acute fracture. No aggressive appearing focal osseous lesion or focal pathologic process. Soft tissues and spinal canal: No prevertebral fluid or swelling. No visible canal hematoma. Upper chest: Unremarkable. Other: None. IMPRESSION: 1. No acute intracranial abnormality. 2. No acute displaced fracture or traumatic listhesis of the cervical spine. 3. Multilevel severe degenerative changes with multilevel severe osseous neural foraminal stenosis. Given history, recommend MRI cervical spine for further evaluation. Electronically Signed   By: Iven Finn M.D.   On: 01/15/2021 19:51   CT Cervical Spine Wo Contrast  Result Date: 01/15/2021 CLINICAL DATA:  bilateral arm pain that started 2 weeks ago. She decided to come in because her arm pain has gotten increasingly worse and is unrelieved. EXAM: CT HEAD WITHOUT CONTRAST CT CERVICAL SPINE WITHOUT CONTRAST TECHNIQUE: Multidetector CT imaging of the head and cervical spine was performed following the standard protocol without intravenous contrast. Multiplanar CT image reconstructions of the cervical spine were also generated. COMPARISON:  None. FINDINGS: CT HEAD FINDINGS Brain: No evidence of large-territorial acute infarction. No parenchymal hemorrhage. No mass lesion. No extra-axial collection. No mass effect or midline shift. No hydrocephalus. Basilar cisterns are patent. Vascular: No hyperdense vessel. Skull: No acute fracture or focal lesion. Sinuses/Orbits: Paranasal sinuses and mastoid air cells are clear. The orbits are unremarkable. Other: None. CT CERVICAL SPINE FINDINGS Alignment: Normal. Skull base and vertebrae: Multilevel severe degenerative changes with multilevel severe osseous neural foraminal stenosis. No severe osseous central canal stenosis. No acute fracture. No aggressive appearing focal osseous lesion or focal pathologic process. Soft tissues and  spinal canal: No prevertebral fluid or swelling. No visible canal hematoma. Upper chest: Unremarkable. Other: None. IMPRESSION: 1. No acute intracranial abnormality. 2. No acute displaced fracture or traumatic listhesis of the cervical spine. 3. Multilevel severe degenerative changes with multilevel severe osseous neural foraminal stenosis. Given history, recommend MRI cervical spine for further evaluation. Electronically Signed   By: Iven Finn M.D.   On: 01/15/2021 19:51   MR Cervical Spine Wo Contrast  Result Date: 01/16/2021 CLINICAL DATA:  Initial evaluation for acute neck pain. EXAM: MRI CERVICAL SPINE WITHOUT CONTRAST TECHNIQUE: Multiplanar, multisequence MR imaging of the cervical spine was performed. No intravenous contrast was administered. COMPARISON:  Prior CT from earlier the same day.  Failed FINDINGS: Alignment: Straightening with mild reversal of the normal cervical lordosis, apex at C4-5. Trace anterolisthesis of C3 on C4, with trace retrolisthesis of C5 on C6. Findings presumably chronic and degenerative. Vertebrae: Vertebral body height maintained without acute or chronic fracture. Bone marrow signal intensity diffusely heterogeneous but overall within normal limits. 1.4 cm benign hemangioma noted within the T1 vertebral body. Additional smaller benign hemangioma noted inferiorly at T2. No worrisome osseous lesions. No abnormal marrow edema. Cord: Normal signal and morphology. Posterior Fossa, vertebral arteries, paraspinal tissues: Visualized brain and posterior fossa within normal limits. Craniocervical junction normal. Paraspinous soft tissues within normal limits. Normal flow voids seen within the vertebral arteries bilaterally. Disc levels: C2-C3: Disc desiccation without disc bulge. Mild left facet hypertrophy. No canal or foraminal stenosis. C3-C4: Advanced degenerative intervertebral disc space narrowing with diffuse disc osteophyte complex. Broad posterior component flattens and  partially effaces the ventral thecal sac without significant spinal stenosis. Left greater than right uncovertebral and facet hypertrophy. Resultant severe left with mild-to-moderate right C4 foraminal stenosis. C4-C5: Moderate degenerative intervertebral disc space narrowing with succumb for external disc osteophyte complex. Broad posterior component flattens and partially effaces the ventral thecal sac, slightly eccentric to the right. Mild spinal stenosis without frank cord impingement. Superimposed bilateral facet degeneration. Resultant severe right with moderate left C5 foraminal stenosis. C5-C6: Moderate to advanced degenerative intervertebral disc space narrowing with diffuse disc osteophyte complex. Flattening and partial effacement of the ventral thecal sac with resultant mild spinal stenosis. Severe bilateral C6 foraminal narrowing. C6-C7: Moderate degenerative intervertebral disc space narrowing with diffuse disc bulge. Left greater than right uncovertebral spurring. No significant spinal stenosis. Severe left with moderate right C7 foraminal stenosis. C7-T1: Disc bulge with left-sided uncovertebral spurring. Mild facet hypertrophy. No spinal stenosis. Mild to moderate left C8 foraminal narrowing. Right neural foramen remains patent. T1-2: Mild disc bulge. Right worse than left facet hypertrophy with right-sided endplate spurring. No spinal stenosis. Moderate right worse than left foraminal narrowing. IMPRESSION: 1. No acute abnormality within the cervical spine. 2. Multilevel cervical spondylosis with resultant mild spinal stenosis at C4-5 and C5-6. No frank cord impingement. 3. Multifactorial degenerative changes with resultant multilevel foraminal narrowing as above. Notable findings include severe left C4 foraminal stenosis, severe right with moderate left C5 foraminal narrowing, severe bilateral C6 foraminal stenosis, with severe left and moderate right C7 foraminal narrowing. Electronically Signed    By: Jeannine Boga M.D.   On: 01/16/2021 01:16    Procedures Procedures   Medications Ordered in ED Medications  HYDROcodone-acetaminophen (NORCO/VICODIN) 5-325 MG per tablet 1 tablet (1 tablet Oral Given 01/15/21 1921)    ED Course  I have reviewed the triage vital signs and the nursing notes.  Pertinent labs & imaging results that were available during my care of the patient were reviewed by me and considered in my medical decision making (see chart for details).    MDM Rules/Calculators/A&P                           60 year old female presenting to the ED with neck and bilateral arm pain.  Worsening over the past 2-1/2 weeks with some intermittent numbness and tingling of both arms.  She has not had any falls or trauma.  Denies any prior neck surgery.  She is awake, alert, appropriately oriented here.  She has equal grips bilaterally, albeit somewhat weak secondary to pain.  She is not able to lift her shoulders much without rotating her trunk or assisting 1 arm with the other.  She has radial pulses intact bilaterally and sensation to each fingertip is normal.  CT head and cervical spine was obtained from triage revealing no acute intracranial abnormalities, but does have multilevel degenerative changes.  Radiology has recommended MRI for further evaluation.  MRI was obtained without any abnormal cord signal, but does have multilevel degenerative changes with multilevel foraminal narrowing.  I suspect this is the etiology of her symptoms. She remains without focal deficits.  No signs/symptoms suggestive of CVA or central cord syndrome. Will refer to neurosurgery.  Rx for hydrocodone and Medrol Dosepak for now.  She understands to return to the ED for any new or acute changes.  Final Clinical Impression(s) / ED Diagnoses Final diagnoses:  Neck pain    Rx / DC Orders ED Discharge Orders          Ordered    HYDROcodone-acetaminophen (NORCO/VICODIN) 5-325 MG tablet  Every 4  hours PRN        01/16/21 0130    methylPREDNISolone (MEDROL DOSEPAK) 4 MG TBPK tablet        01/16/21 0130             Jaskiran, Pata, PA-C 01/16/21 0135    Luna Fuse, MD 01/19/21 1313

## 2021-01-15 NOTE — ED Provider Notes (Signed)
Emergency Medicine Provider Triage Evaluation Note  Audrey Farley , a 60 y.o. female  was evaluated in triage.  Pt complains of bilateral arm pain and limited range of motion.  Started 2 and half weeks ago however is worsening and extending into her fingers.  Also endorsing a headache.  Review of Systems  Positive: Arm pain, weakness and headache Negative: Numbness  Physical Exam  BP 123/81 (BP Location: Left Arm)   Pulse 85   Temp 98 F (36.7 C) (Oral)   Resp 18   Ht 5\' 3"  (1.6 m)   Wt 95.3 kg   SpO2 96%   BMI 37.20 kg/m  Gen:   Awake, no distress   Resp:  Normal effort  MSK:   Moves extremities without difficulty  Other:  Unable to flex or abduct the shoulders past 15 degrees  Medical Decision Making  Medically screening exam initiated at 7:07 PM.  Appropriate orders placed.  Audrey Farley was informed that the remainder of the evaluation will be completed by another provider, this initial triage assessment does not replace that evaluation, and the importance of remaining in the ED until their evaluation is complete.     Audrey Hammock, PA-C 01/15/21 1908    Audrey Sparrow, DO 01/17/21 [id number]

## 2021-01-16 MED ORDER — METHYLPREDNISOLONE 4 MG PO TBPK: ORAL_TABLET | ORAL | 0 refills | Status: DC

## 2021-01-16 MED ORDER — HYDROCODONE-ACETAMINOPHEN 5-325 MG PO TABS: 1.0000 | ORAL_TABLET | ORAL | 0 refills | Status: DC | PRN

## 2021-01-16 NOTE — Discharge Instructions (Signed)
Take the prescribed medication as directed. Follow-up with Dr. Reatha Armour--- I would call his office in the morning to get appt scheduled. Return to the ED for new or worsening symptoms.

## 2021-01-27 ENCOUNTER — Inpatient Hospital Stay: Payer: Managed Care, Other (non HMO) | Attending: Oncology | Admitting: Oncology

## 2021-01-27 ENCOUNTER — Encounter: Payer: Self-pay | Admitting: Oncology

## 2021-01-27 ENCOUNTER — Inpatient Hospital Stay: Payer: Managed Care, Other (non HMO)

## 2021-01-27 ENCOUNTER — Other Ambulatory Visit: Payer: Self-pay

## 2021-01-27 VITALS — BP 145/81 | HR 63 | Temp 96.9°F | Resp 18 | Ht 64.0 in | Wt 216.3 lb

## 2021-01-27 DIAGNOSIS — D649 Anemia, unspecified: Secondary | ICD-10-CM | POA: Insufficient documentation

## 2021-01-27 DIAGNOSIS — E538 Deficiency of other specified B group vitamins: Secondary | ICD-10-CM | POA: Diagnosis not present

## 2021-01-27 NOTE — Progress Notes (Signed)
Hematology/Oncology Consult note [facility] [phone] [phone]  Patient Care Team: Gae Bon, NP as PCP - General (Nurse Practitioner) Skeet Latch, MD as PCP - Cardiology (Cardiology)   Name of the patient: Audrey Farley  [id number]  Apr 08, 1960    Reason for referral-anemia   Referring physician-Jennifer Stann Mainland  Date of visit: 01/27/21   History of presenting illness-patient is a 60 year old African-American female with a past medical history significant for hypertension hyperlipidemia GERD among other medical problems.  She has been referred to [place] for anemia.Most recent CBC from November 2022 showed a hemoglobin of 11 that was normocytic.  Prior to that her hemoglobin was 10 in July 2022.  B12 levels low at 197 and folate levels which were previously abnormal in July normalized to 7.9.  Serum iron low at 20 and iron saturation 8%.  Patient denies any family history of cancer.  Denies any blood loss in her stool or urine.  Denies any dark melanotic stool.  Denies any consistent use of NSAIDs.  States that she had a colonoscopy in August or September 2022 diagnosis pulmonary not seen colonoscopy reports in the system.  She has not tried oral iron or oral B12.  Reports mild fatigue but denies other complaints at this time.  ECOG PS- 0  Pain scale- 0   Review of systems- Review of Systems  Constitutional:  Positive for malaise/fatigue. Negative for chills, fever and weight loss.  HENT:  Negative for congestion, ear discharge and nosebleeds.   Eyes:  Negative for blurred vision.  Respiratory:  Negative for cough, hemoptysis, sputum production, shortness of breath and wheezing.   Cardiovascular:  Negative for chest pain, palpitations, orthopnea and claudication.  Gastrointestinal:  Negative for abdominal pain, blood in stool, constipation, diarrhea, heartburn, melena, nausea and vomiting.  Genitourinary:  Negative for  dysuria, flank pain, frequency, hematuria and urgency.  Musculoskeletal:  Negative for back pain, joint pain and myalgias.  Skin:  Negative for rash.  Neurological:  Negative for dizziness, tingling, focal weakness, seizures, weakness and headaches.  Endo/Heme/Allergies:  Does not bruise/bleed easily.  Psychiatric/Behavioral:  Negative for depression and suicidal ideas. The patient does not have insomnia.    Allergies  Allergen Reactions   Celecoxib Nausea Only   Mirabegron Nausea Only   No Known Allergies    Latex Itching    Patient Active Problem List   Diagnosis Date Noted   CAD in native artery 11/19/2020   Hyperlipidemia 10/15/2020   Pain in limb 10/15/2020   Pain due to total left knee replacement ([place]) 09/02/2020   Pes anserine bursitis 01/19/2020   Atypical chest pain 06/02/2019   DOE (dyspnea on exertion) 06/02/2019   Abnormal finding on EKG 06/02/2019   Essential hypertension 06/02/2019   Knee joint replaced by other means 09/28/2016   Primary osteoarthritis of left knee 08/19/2016   Vitamin D deficiency 08/08/2016   Aneurysm of ascending aorta 02/25/2016   GERD (gastroesophageal reflux disease)    Allergic rhinitis    Allergy    Urge incontinence      Past Medical History:  Diagnosis Date   Allergy    Aneurysm of ascending aorta 02/25/2016   3.9cm by echo 02/2016  -aorta enlarged   Arthritis    CAD in native artery 11/19/2020   Chest pain 01/24/2016   Dyspnea on exertion    Hypertension    Urge incontinence      Past Surgical History:  Procedure Laterality Date   ABDOMINAL  HYSTERECTOMY     still has cervix; DUB; ovaries intact.   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   TOTAL KNEE ARTHROPLASTY Left 09/28/2016   Procedure: LEFT TOTAL KNEE ARTHROPLASTY;  Surgeon: Vickey Huger, MD;  Location: [facility];  Service: Orthopedics;  Laterality: Left;    Social History   Socioeconomic History   Marital status: Single    Spouse  name: Not on file   Number of children: Not on file   Years of education: Not on file   Highest education level: Not on file  Occupational History   Not on file  Tobacco Use   Smoking status: Never   Smokeless tobacco: Never  Vaping Use   Vaping Use: Never used  Substance and Sexual Activity   Alcohol use: Yes    Comment: occ vodka   Drug use: No   Sexual activity: Yes    Birth control/protection: Surgical, Post-menopausal  Other Topics Concern   Not on file  Social History Narrative   Marital status: single; not dating since 2014      Children: 2 daughters (16, 77); no grandchildren      Lives: with 2 daughters       Employment: tobacco company; Stage manager x 16 years; loves job!  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        Tobacco: none      Alcohol; weekends/socially      Drugs: none      Exercise:  Two days per week; cardio      Seatbelt: 100%; no texting      Sexual activity: not sexually active since 2014; dates males; no STDs; total partners = 7.   Social Determinants of Health   Financial Resource Strain: Low Risk    Difficulty of Paying Living Expenses: Not hard at all  Food Insecurity: No Food Insecurity   Worried About Charity fundraiser in the Last Year: Never true   [place] in the Last Year: Never true  Transportation Needs: No Transportation Needs   Lack of Transportation (Medical): No   Lack of Transportation (Non-Medical): No  Physical Activity: Inactive   Days of Exercise per Week: 0 days   Minutes of Exercise per Session: 0 min  Stress: Not on file  Social Connections: Not on file  Intimate Partner Violence: Not on file     Family History  Problem Relation Age of Onset   Diabetes Mother    Hypertension Mother    Heart attack Mother    Hyperlipidemia Mother    Heart disease Mother        heart problems; no AMI   Kidney disease Mother    Hyperlipidemia Sister    Hypertension Sister    Multiple sclerosis  Sister    Mental illness Brother    Epilepsy Brother    Diabetes Sister    Sudden death Neg Hx      Current Outpatient Medications:    acetaminophen (TYLENOL) 500 MG tablet, Take by mouth., Disp: , Rfl:    albuterol (VENTOLIN HFA) 108 (90 Base) MCG/ACT inhaler, Inhale 2 puffs into the lungs every 4 (four) hours as needed for wheezing or shortness of breath., Disp: , Rfl:    ascorbic acid (VITAMIN C) 250 MG tablet, Take by mouth., Disp: , Rfl:    aspirin EC 81 MG tablet, Take 1 tablet (81 mg total) by mouth daily., Disp: 90 tablet, Rfl: 3  atorvastatin (LIPITOR) 20 MG tablet, Take 20 mg by mouth at bedtime., Disp: , Rfl:    celecoxib (CELEBREX) 200 MG capsule, Take 200 mg by mouth 2 (two) times daily., Disp: , Rfl:    cholecalciferol (VITAMIN D3) 25 MCG (1000 UNIT) tablet, Take 1,000 Units by mouth daily., Disp: , Rfl:    diltiazem (CARDIZEM CD) 120 MG 24 hr capsule, Take 1 capsule (120 mg total) by mouth daily., Disp: 90 capsule, Rfl: 3   methylPREDNISolone (MEDROL DOSEPAK) 4 MG TBPK tablet, Follow package instructions., Disp: 21 tablet, Rfl: 0   telmisartan-hydrochlorothiazide (MICARDIS HCT) 80-12.5 MG tablet, Take 1 tablet by mouth daily., Disp: 90 tablet, Rfl: 0   vitamin B-12 (CYANOCOBALAMIN) 1000 MCG tablet, Take 1,000 mcg by mouth daily., Disp: , Rfl:    benzonatate (TESSALON) 100 MG capsule, Take 100-200 mg by mouth 3 (three) times daily as needed. (Patient not taking: Reported on 01/27/2021), Disp: , Rfl:    diltiazem (TIAZAC) 120 MG 24 hr capsule, Take by mouth. (Patient not taking: Reported on 01/27/2021), Disp: , Rfl:    ferrous sulfate 325 (65 FE) MG EC tablet, Take by mouth. (Patient not taking: Reported on 01/27/2021), Disp: , Rfl:    folic acid (FOLVITE) 1 MG tablet, Take 1 mg by mouth daily. (Patient not taking: Reported on 01/27/2021), Disp: , Rfl:    guaiFENesin-codeine 100-10 MG/5ML syrup, SMARTSIG:5-10 Milliliter(s) By Mouth 4 Times Daily PRN (Patient not taking: Reported  on 01/27/2021), Disp: , Rfl:    HYDROcodone-acetaminophen (NORCO/VICODIN) 5-325 MG tablet, Take 1 tablet by mouth every 4 (four) hours as needed. (Patient not taking: Reported on 01/27/2021), Disp: 10 tablet, Rfl: 0   pantoprazole (PROTONIX) 40 MG tablet, Take by mouth. (Patient not taking: Reported on 01/27/2021), Disp: , Rfl:    rosuvastatin (CRESTOR) 20 MG tablet, Take 1 tablet (20 mg total) by mouth daily. (Patient not taking: Reported on 01/15/2021), Disp: 90 tablet, Rfl: 3   Physical exam:  Vitals:   01/27/21 0942  BP: (!) 145/81  Pulse: 63  Resp: 18  Temp: (!) 96.9 F (36.1 C)  SpO2: 99%  Weight: 216 lb 4.8 oz (98.1 kg)  Height: 5\' 4"  (1.626 m)   Physical Exam Cardiovascular:     Rate and Rhythm: Normal rate and regular rhythm.     Heart sounds: Normal heart sounds.  Pulmonary:     Effort: Pulmonary effort is normal.     Breath sounds: Normal breath sounds.  Abdominal:     General: Bowel sounds are normal.     Palpations: Abdomen is soft.  Skin:    General: Skin is warm and dry.  Neurological:     Mental Status: She is alert and oriented to person, place, and time.       CMP Latest Ref Rng & Units 11/19/2020  Glucose 65 - 99 mg/dL 94  BUN 6 - 24 mg/dL 16  Creatinine 0.57 - 1.00 mg/dL 0.99  Sodium 134 - 144 mmol/L 141  Potassium 3.5 - 5.2 mmol/L 4.0  Chloride 96 - 106 mmol/L 103  CO2 20 - 29 mmol/L 23  Calcium 8.7 - 10.2 mg/dL 9.5  Total Protein 6.0 - 8.5 g/dL 6.8  Total Bilirubin 0.0 - 1.2 mg/dL 0.4  Alkaline Phos 44 - 121 IU/L 101  AST 0 - 40 IU/L 11  ALT 0 - 32 IU/L 17   CBC Latest Ref Rng & Units 08/16/2020  WBC 3.4 - 10.8 x10E3/uL 9.1  Hemoglobin 11.1 - 15.9 g/dL 12.0  Hematocrit 34.0 - 46.6 % 35.3  Platelets 150 - 450 x10E3/uL 203    No images are attached to the encounter.  CT Head Wo Contrast  Result Date: 01/15/2021 CLINICAL DATA:  bilateral arm pain that started 2 weeks ago. She decided to come in because her arm pain has gotten increasingly  worse and is unrelieved. EXAM: CT HEAD WITHOUT CONTRAST CT CERVICAL SPINE WITHOUT CONTRAST TECHNIQUE: Multidetector CT imaging of the head and cervical spine was performed following the standard protocol without intravenous contrast. Multiplanar CT image reconstructions of the cervical spine were also generated. COMPARISON:  None. FINDINGS: CT HEAD FINDINGS Brain: No evidence of large-territorial acute infarction. No parenchymal hemorrhage. No mass lesion. No extra-axial collection. No mass effect or midline shift. No hydrocephalus. Basilar cisterns are patent. Vascular: No hyperdense vessel. Skull: No acute fracture or focal lesion. Sinuses/Orbits: Paranasal sinuses and mastoid air cells are clear. The orbits are unremarkable. Other: None. CT CERVICAL SPINE FINDINGS Alignment: Normal. Skull base and vertebrae: Multilevel severe degenerative changes with multilevel severe osseous neural foraminal stenosis. No severe osseous central canal stenosis. No acute fracture. No aggressive appearing focal osseous lesion or focal pathologic process. Soft tissues and spinal canal: No prevertebral fluid or swelling. No visible canal hematoma. Upper chest: Unremarkable. Other: None. IMPRESSION: 1. No acute intracranial abnormality. 2. No acute displaced fracture or traumatic listhesis of the cervical spine. 3. Multilevel severe degenerative changes with multilevel severe osseous neural foraminal stenosis. Given history, recommend MRI cervical spine for further evaluation. Electronically Signed   By: Iven Finn M.D.   On: 01/15/2021 19:51   CT Cervical Spine Wo Contrast  Result Date: 01/15/2021 CLINICAL DATA:  bilateral arm pain that started 2 weeks ago. She decided to come in because her arm pain has gotten increasingly worse and is unrelieved. EXAM: CT HEAD WITHOUT CONTRAST CT CERVICAL SPINE WITHOUT CONTRAST TECHNIQUE: Multidetector CT imaging of the head and cervical spine was performed following the standard protocol  without intravenous contrast. Multiplanar CT image reconstructions of the cervical spine were also generated. COMPARISON:  None. FINDINGS: CT HEAD FINDINGS Brain: No evidence of large-territorial acute infarction. No parenchymal hemorrhage. No mass lesion. No extra-axial collection. No mass effect or midline shift. No hydrocephalus. Basilar cisterns are patent. Vascular: No hyperdense vessel. Skull: No acute fracture or focal lesion. Sinuses/Orbits: Paranasal sinuses and mastoid air cells are clear. The orbits are unremarkable. Other: None. CT CERVICAL SPINE FINDINGS Alignment: Normal. Skull base and vertebrae: Multilevel severe degenerative changes with multilevel severe osseous neural foraminal stenosis. No severe osseous central canal stenosis. No acute fracture. No aggressive appearing focal osseous lesion or focal pathologic process. Soft tissues and spinal canal: No prevertebral fluid or swelling. No visible canal hematoma. Upper chest: Unremarkable. Other: None. IMPRESSION: 1. No acute intracranial abnormality. 2. No acute displaced fracture or traumatic listhesis of the cervical spine. 3. Multilevel severe degenerative changes with multilevel severe osseous neural foraminal stenosis. Given history, recommend MRI cervical spine for further evaluation. Electronically Signed   By: Iven Finn M.D.   On: 01/15/2021 19:51   MR Cervical Spine Wo Contrast  Result Date: 01/16/2021 CLINICAL DATA:  Initial evaluation for acute neck pain. EXAM: MRI CERVICAL SPINE WITHOUT CONTRAST TECHNIQUE: Multiplanar, multisequence MR imaging of the cervical spine was performed. No intravenous contrast was administered. COMPARISON:  Prior CT from earlier the same day.  Failed FINDINGS: Alignment: Straightening with mild reversal of the normal cervical lordosis, apex at C4-5. Trace anterolisthesis of C3 on C4, with trace  retrolisthesis of C5 on C6. Findings presumably chronic and degenerative. Vertebrae: Vertebral body height  maintained without acute or chronic fracture. Bone marrow signal intensity diffusely heterogeneous but overall within normal limits. 1.4 cm benign hemangioma noted within the T1 vertebral body. Additional smaller benign hemangioma noted inferiorly at T2. No worrisome osseous lesions. No abnormal marrow edema. Cord: Normal signal and morphology. Posterior Fossa, vertebral arteries, paraspinal tissues: Visualized brain and posterior fossa within normal limits. Craniocervical junction normal. Paraspinous soft tissues within normal limits. Normal flow voids seen within the vertebral arteries bilaterally. Disc levels: C2-C3: Disc desiccation without disc bulge. Mild left facet hypertrophy. No canal or foraminal stenosis. C3-C4: Advanced degenerative intervertebral disc space narrowing with diffuse disc osteophyte complex. Broad posterior component flattens and partially effaces the ventral thecal sac without significant spinal stenosis. Left greater than right uncovertebral and facet hypertrophy. Resultant severe left with mild-to-moderate right C4 foraminal stenosis. C4-C5: Moderate degenerative intervertebral disc space narrowing with succumb for external disc osteophyte complex. Broad posterior component flattens and partially effaces the ventral thecal sac, slightly eccentric to the right. Mild spinal stenosis without frank cord impingement. Superimposed bilateral facet degeneration. Resultant severe right with moderate left C5 foraminal stenosis. C5-C6: Moderate to advanced degenerative intervertebral disc space narrowing with diffuse disc osteophyte complex. Flattening and partial effacement of the ventral thecal sac with resultant mild spinal stenosis. Severe bilateral C6 foraminal narrowing. C6-C7: Moderate degenerative intervertebral disc space narrowing with diffuse disc bulge. Left greater than right uncovertebral spurring. No significant spinal stenosis. Severe left with moderate right C7 foraminal stenosis.  C7-T1: Disc bulge with left-sided uncovertebral spurring. Mild facet hypertrophy. No spinal stenosis. Mild to moderate left C8 foraminal narrowing. Right neural foramen remains patent. T1-2: Mild disc bulge. Right worse than left facet hypertrophy with right-sided endplate spurring. No spinal stenosis. Moderate right worse than left foraminal narrowing. IMPRESSION: 1. No acute abnormality within the cervical spine. 2. Multilevel cervical spondylosis with resultant mild spinal stenosis at C4-5 and C5-6. No frank cord impingement. 3. Multifactorial degenerative changes with resultant multilevel foraminal narrowing as above. Notable findings include severe left C4 foraminal stenosis, severe right with moderate left C5 foraminal narrowing, severe bilateral C6 foraminal stenosis, with severe left and moderate right C7 foraminal narrowing. Electronically Signed   By: Jeannine Boga M.D.   On: 01/16/2021 01:16    Assessment and plan- Patient is a 60 y.o. female referred for normocytic anemia  Patient's hemoglobin was closer to 10 in early November which has subsequently improved to 11.2 based on her CBC on 01/22/2021.  Labs reveal evidence of both iron and B12 deficiency.  Given that her anemia is mild she would like to try oral iron and B12 at this time.  We have given her written instructions as to how to obtain them over-the-counter.  Repeat CBC ferritin and iron studies B12 folate in 2 months followed by in person or video visit.  If hemoglobin fails to improve despite oral therapy we will consider doing IV treatment at that time.  I do not see any reports of her colonoscopy in her system and I will try to look into this further.  If iron deficiency persists she will need to complete GI work-up   Thank you for this kind referral and the opportunity to participate in the care of this patient   Visit Diagnosis 1. Normocytic anemia     Dr. Randa Evens, MD, MPH [facility] [phone] 01/27/2021

## 2021-01-28 ENCOUNTER — Ambulatory Visit (INDEPENDENT_AMBULATORY_CARE_PROVIDER_SITE_OTHER): Payer: Managed Care, Other (non HMO) | Admitting: Vascular Surgery

## 2021-02-04 ENCOUNTER — Ambulatory Visit (INDEPENDENT_AMBULATORY_CARE_PROVIDER_SITE_OTHER): Payer: Managed Care, Other (non HMO) | Admitting: Vascular Surgery

## 2021-02-04 ENCOUNTER — Other Ambulatory Visit: Payer: Self-pay

## 2021-02-04 ENCOUNTER — Encounter (INDEPENDENT_AMBULATORY_CARE_PROVIDER_SITE_OTHER): Payer: Self-pay | Admitting: Vascular Surgery

## 2021-02-04 VITALS — BP 125/79 | HR 66 | Ht 63.0 in | Wt 207.0 lb

## 2021-02-04 DIAGNOSIS — I10 Essential (primary) hypertension: Secondary | ICD-10-CM | POA: Diagnosis not present

## 2021-02-04 DIAGNOSIS — I7121 Aneurysm of the ascending aorta, without rupture: Secondary | ICD-10-CM

## 2021-02-04 DIAGNOSIS — M79604 Pain in right leg: Secondary | ICD-10-CM

## 2021-02-04 DIAGNOSIS — E785 Hyperlipidemia, unspecified: Secondary | ICD-10-CM | POA: Diagnosis not present

## 2021-02-04 DIAGNOSIS — M79605 Pain in left leg: Secondary | ICD-10-CM

## 2021-02-04 DIAGNOSIS — I872 Venous insufficiency (chronic) (peripheral): Secondary | ICD-10-CM | POA: Insufficient documentation

## 2021-02-04 NOTE — Progress Notes (Signed)
MRN : [id number]  Audrey Farley is a 60 y.o. (Feb 05, 1961) female who presents with chief complaint of  Chief Complaint  Patient presents with   Follow-up    3 mo no studies  .  History of Present Illness: Patient returns today in follow up of her leg pain.  This is about the same as it was a few months ago.  She was found to have a hardware issue with her left knee and is having surgery to repair/replace that next week.  Still having pain and swelling that is about the same.  No new ulceration or infection.  Her previously performed venous duplex showed very mild venous disease in both lower extremities.  Current Outpatient Medications  Medication Sig Dispense Refill   acetaminophen (TYLENOL) 500 MG tablet Take by mouth.     albuterol (VENTOLIN HFA) 108 (90 Base) MCG/ACT inhaler Inhale 2 puffs into the lungs every 4 (four) hours as needed for wheezing or shortness of breath.     ascorbic acid (VITAMIN C) 250 MG tablet Take by mouth.     aspirin EC 81 MG tablet Take 1 tablet (81 mg total) by mouth daily. 90 tablet 3   atorvastatin (LIPITOR) 20 MG tablet Take 20 mg by mouth at bedtime.     cholecalciferol (VITAMIN D3) 25 MCG (1000 UNIT) tablet Take 1,000 Units by mouth daily.     telmisartan-hydrochlorothiazide (MICARDIS HCT) 80-12.5 MG tablet Take 1 tablet by mouth daily. 90 tablet 0   vitamin B-12 (CYANOCOBALAMIN) 1000 MCG tablet Take 1,000 mcg by mouth daily.     benzonatate (TESSALON) 100 MG capsule Take 100-200 mg by mouth 3 (three) times daily as needed. (Patient not taking: Reported on 01/27/2021)     celecoxib (CELEBREX) 200 MG capsule Take 200 mg by mouth 2 (two) times daily. (Patient not taking: Reported on 02/04/2021)     diltiazem (CARDIZEM CD) 120 MG 24 hr capsule Take 1 capsule (120 mg total) by mouth daily. 90 capsule 3   diltiazem (TIAZAC) 120 MG 24 hr capsule Take by mouth. (Patient not taking: Reported on 01/27/2021)     ferrous sulfate 325 (65 FE) MG EC tablet Take  by mouth. (Patient not taking: Reported on 62/83/6629)     folic acid (FOLVITE) 1 MG tablet Take 1 mg by mouth daily. (Patient not taking: Reported on 01/27/2021)     guaiFENesin-codeine 100-10 MG/5ML syrup SMARTSIG:5-10 Milliliter(s) By Mouth 4 Times Daily PRN (Patient not taking: Reported on 01/27/2021)     HYDROcodone-acetaminophen (NORCO/VICODIN) 5-325 MG tablet Take 1 tablet by mouth every 4 (four) hours as needed. (Patient not taking: Reported on 01/27/2021) 10 tablet 0   methylPREDNISolone (MEDROL DOSEPAK) 4 MG TBPK tablet Follow package instructions. (Patient not taking: Reported on 02/04/2021) 21 tablet 0   pantoprazole (PROTONIX) 40 MG tablet Take by mouth. (Patient not taking: Reported on 01/27/2021)     rosuvastatin (CRESTOR) 20 MG tablet Take 1 tablet (20 mg total) by mouth daily. (Patient not taking: Reported on 01/15/2021) 90 tablet 3   No current facility-administered medications for this visit.    Past Medical History:  Diagnosis Date   Allergy    Aneurysm of ascending aorta 02/25/2016   3.9cm by echo 02/2016  -aorta enlarged   Arthritis    CAD in native artery 11/19/2020   Chest pain 01/24/2016   Dyspnea on exertion    Hypertension    Urge incontinence     Past Surgical History:  Procedure Laterality Date  ABDOMINAL HYSTERECTOMY     still has cervix; DUB; ovaries intact.   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   TOTAL KNEE ARTHROPLASTY Left 09/28/2016   Procedure: LEFT TOTAL KNEE ARTHROPLASTY;  Surgeon: Vickey Huger, MD;  Location: [facility];  Service: Orthopedics;  Laterality: Left;     Social History   Tobacco Use   Smoking status: Never   Smokeless tobacco: Never  Vaping Use   Vaping Use: Never used  Substance Use Topics   Alcohol use: Yes    Comment: occ vodka   Drug use: No      Family History  Problem Relation Age of Onset   Diabetes Mother    Hypertension Mother    Heart attack Mother    Hyperlipidemia Mother     Heart disease Mother        heart problems; no AMI   Kidney disease Mother    Hyperlipidemia Sister    Hypertension Sister    Multiple sclerosis Sister    Mental illness Brother    Epilepsy Brother    Diabetes Sister    Sudden death Neg Hx      Allergies  Allergen Reactions   Celecoxib Nausea Only   Mirabegron Nausea Only   No Known Allergies    Latex Itching     REVIEW OF SYSTEMS (Negative unless checked)   Constitutional: _0 Weight loss  _1 Fever  _2 Chills Cardiac: _3 Chest pain   _4 Chest pressure   _5 Palpitations   _6 Shortness of breath when laying flat   _7 Shortness of breath at rest   _8 Shortness of breath with exertion. Vascular:  _9 Pain in legs with walking   _10 Pain in legs at rest   _11 Pain in legs when laying flat   _12 Claudication   _13 Pain in feet when walking  _14 Pain in feet at rest  _15 Pain in feet when laying flat   _16 History of DVT   _17 Phlebitis   _18 Swelling in legs   _19 Varicose veins   _20 Non-healing ulcers Pulmonary:   _21 Uses home oxygen   _22 Productive cough   _23 Hemoptysis   _24 Wheeze  _25 COPD   _26 Asthma Neurologic:  _27 Dizziness  _28 Blackouts   _29 Seizures   _30 History of stroke   _31 History of TIA  _32 Aphasia   _33 Temporary blindness   _34 Dysphagia   _35 Weakness or numbness in arms   _36 Weakness or numbness in legs Musculoskeletal:  _37 Arthritis   _38 Joint swelling   _39 Joint pain   _40 Low back pain Hematologic:  _41 Easy bruising  _42 Easy bleeding   _43 Hypercoagulable state   _44 Anemic  _45 Hepatitis Gastrointestinal:  _46 Blood in stool   _47 Vomiting blood  _48 Gastroesophageal reflux/heartburn   _49 Abdominal pain Genitourinary:  _50 Chronic kidney disease   _51 Difficult urination  _52 Frequent urination  _53 Burning with urination   _54 Hematuria Skin:  _55 Rashes   _56 Ulcers   _57 Wounds Psychological:  _58 History of anxiety   _59  History of major depression.   Physical Examination  BP 125/79   Pulse 66   Ht _60  (1.6 m)   Wt 207 lb (93.9 kg)   BMI 36.67 kg/m  Gen:  WD/WN, NAD Head: [place]/AT, No  temporalis wasting. Ear/Nose/Throat: Hearing grossly intact, nares w/o erythema or drainage Eyes: Conjunctiva clear. Sclera non-icteric Neck: Supple.  Trachea midline Pulmonary:  Good air movement, no use of accessory muscles.  Cardiac: RRR, no JVD Vascular:  Vessel Right Left  Radial Palpable Palpable  PT Palpable Palpable  DP Palpable Palpable   Musculoskeletal: M/S 5/5 throughout.  No deformity or atrophy. Trace RLE edema, 1+ LLE edema. Neurologic: Sensation grossly intact in extremities.  Symmetrical.  Speech is fluent.  Psychiatric: Judgment intact, Mood & affect appropriate for pt's clinical situation. Dermatologic: No rashes or ulcers noted.  No cellulitis or open wounds.      Labs Recent Results (from the past 2160 hour(s))  Lipid panel     Status: None   Collection Time: 11/19/20 10:00 AM  Result Value Ref Range   Cholesterol, Total 184 100 - 199 mg/dL   Triglycerides 51 0 - 149 mg/dL   HDL 95 >39 mg/dL   VLDL Cholesterol Cal 10 5 - 40 mg/dL   LDL Chol Calc (NIH) 79 0 - 99 mg/dL   Chol/HDL Ratio 1.9 0.0 - 4.4 ratio    Comment:                                   T. Chol/HDL Ratio                                             Men  Women                               1/2 Avg.Risk  3.4    3.3                                   Avg.Risk  5.0    4.4                                2X Avg.Risk  9.6    7.1                                3X Avg.Risk 23.4   11.0   Comprehensive metabolic panel     Status: None   Collection Time: 11/19/20 10:00 AM  Result Value Ref Range   Glucose 94 65 - 99 mg/dL   BUN 16 6 - 24 mg/dL   Creatinine, Ser 0.99 0.57 - 1.00 mg/dL   eGFR 66 >59 mL/min/1.73   BUN/Creatinine Ratio 16 9 - 23   Sodium 141 134 - 144 mmol/L   Potassium 4.0 3.5 - 5.2 mmol/L   Chloride 103 96 - 106 mmol/L   CO2 23 20 - 29 mmol/L   Calcium 9.5 8.7 - 10.2 mg/dL   Total Protein 6.8 6.0 - 8.5 g/dL   Albumin 4.2 3.8 - 4.9 g/dL   Globulin,  Total 2.6 1.5 - 4.5 g/dL   Albumin/Globulin Ratio 1.6 1.2 - 2.2   Bilirubin Total 0.4 0.0 - 1.2 mg/dL   Alkaline Phosphatase 101 44 - 121 IU/L   AST 11 0 - 40 IU/L   ALT 17 0 - 32 IU/L    Radiology CT Head Wo Contrast  Result Date: 01/15/2021 CLINICAL DATA:  bilateral arm pain that started 2 weeks ago. She decided to come in because her arm pain has gotten increasingly worse and is unrelieved.  EXAM: CT HEAD WITHOUT CONTRAST CT CERVICAL SPINE WITHOUT CONTRAST TECHNIQUE: Multidetector CT imaging of the head and cervical spine was performed following the standard protocol without intravenous contrast. Multiplanar CT image reconstructions of the cervical spine were also generated. COMPARISON:  None. FINDINGS: CT HEAD FINDINGS Brain: No evidence of large-territorial acute infarction. No parenchymal hemorrhage. No mass lesion. No extra-axial collection. No mass effect or midline shift. No hydrocephalus. Basilar cisterns are patent. Vascular: No hyperdense vessel. Skull: No acute fracture or focal lesion. Sinuses/Orbits: Paranasal sinuses and mastoid air cells are clear. The orbits are unremarkable. Other: None. CT CERVICAL SPINE FINDINGS Alignment: Normal. Skull base and vertebrae: Multilevel severe degenerative changes with multilevel severe osseous neural foraminal stenosis. No severe osseous central canal stenosis. No acute fracture. No aggressive appearing focal osseous lesion or focal pathologic process. Soft tissues and spinal canal: No prevertebral fluid or swelling. No visible canal hematoma. Upper chest: Unremarkable. Other: None. IMPRESSION: 1. No acute intracranial abnormality. 2. No acute displaced fracture or traumatic listhesis of the cervical spine. 3. Multilevel severe degenerative changes with multilevel severe osseous neural foraminal stenosis. Given history, recommend MRI cervical spine for further evaluation. Electronically Signed   By: Iven Finn M.D.   On: 01/15/2021 19:51   CT  Cervical Spine Wo Contrast  Result Date: 01/15/2021 CLINICAL DATA:  bilateral arm pain that started 2 weeks ago. She decided to come in because her arm pain has gotten increasingly worse and is unrelieved. EXAM: CT HEAD WITHOUT CONTRAST CT CERVICAL SPINE WITHOUT CONTRAST TECHNIQUE: Multidetector CT imaging of the head and cervical spine was performed following the standard protocol without intravenous contrast. Multiplanar CT image reconstructions of the cervical spine were also generated. COMPARISON:  None. FINDINGS: CT HEAD FINDINGS Brain: No evidence of large-territorial acute infarction. No parenchymal hemorrhage. No mass lesion. No extra-axial collection. No mass effect or midline shift. No hydrocephalus. Basilar cisterns are patent. Vascular: No hyperdense vessel. Skull: No acute fracture or focal lesion. Sinuses/Orbits: Paranasal sinuses and mastoid air cells are clear. The orbits are unremarkable. Other: None. CT CERVICAL SPINE FINDINGS Alignment: Normal. Skull base and vertebrae: Multilevel severe degenerative changes with multilevel severe osseous neural foraminal stenosis. No severe osseous central canal stenosis. No acute fracture. No aggressive appearing focal osseous lesion or focal pathologic process. Soft tissues and spinal canal: No prevertebral fluid or swelling. No visible canal hematoma. Upper chest: Unremarkable. Other: None. IMPRESSION: 1. No acute intracranial abnormality. 2. No acute displaced fracture or traumatic listhesis of the cervical spine. 3. Multilevel severe degenerative changes with multilevel severe osseous neural foraminal stenosis. Given history, recommend MRI cervical spine for further evaluation. Electronically Signed   By: Iven Finn M.D.   On: 01/15/2021 19:51   MR Cervical Spine Wo Contrast  Result Date: 01/16/2021 CLINICAL DATA:  Initial evaluation for acute neck pain. EXAM: MRI CERVICAL SPINE WITHOUT CONTRAST TECHNIQUE: Multiplanar, multisequence MR imaging of  the cervical spine was performed. No intravenous contrast was administered. COMPARISON:  Prior CT from earlier the same day.  Failed FINDINGS: Alignment: Straightening with mild reversal of the normal cervical lordosis, apex at C4-5. Trace anterolisthesis of C3 on C4, with trace retrolisthesis of C5 on C6. Findings presumably chronic and degenerative. Vertebrae: Vertebral body height maintained without acute or chronic fracture. Bone marrow signal intensity diffusely heterogeneous but overall within normal limits. 1.4 cm benign hemangioma noted within the T1 vertebral body. Additional smaller benign hemangioma noted inferiorly at T2. No worrisome osseous lesions. No abnormal marrow edema. Cord: Normal  signal and morphology. Posterior Fossa, vertebral arteries, paraspinal tissues: Visualized brain and posterior fossa within normal limits. Craniocervical junction normal. Paraspinous soft tissues within normal limits. Normal flow voids seen within the vertebral arteries bilaterally. Disc levels: C2-C3: Disc desiccation without disc bulge. Mild left facet hypertrophy. No canal or foraminal stenosis. C3-C4: Advanced degenerative intervertebral disc space narrowing with diffuse disc osteophyte complex. Broad posterior component flattens and partially effaces the ventral thecal sac without significant spinal stenosis. Left greater than right uncovertebral and facet hypertrophy. Resultant severe left with mild-to-moderate right C4 foraminal stenosis. C4-C5: Moderate degenerative intervertebral disc space narrowing with succumb for external disc osteophyte complex. Broad posterior component flattens and partially effaces the ventral thecal sac, slightly eccentric to the right. Mild spinal stenosis without frank cord impingement. Superimposed bilateral facet degeneration. Resultant severe right with moderate left C5 foraminal stenosis. C5-C6: Moderate to advanced degenerative intervertebral disc space narrowing with diffuse  disc osteophyte complex. Flattening and partial effacement of the ventral thecal sac with resultant mild spinal stenosis. Severe bilateral C6 foraminal narrowing. C6-C7: Moderate degenerative intervertebral disc space narrowing with diffuse disc bulge. Left greater than right uncovertebral spurring. No significant spinal stenosis. Severe left with moderate right C7 foraminal stenosis. C7-T1: Disc bulge with left-sided uncovertebral spurring. Mild facet hypertrophy. No spinal stenosis. Mild to moderate left C8 foraminal narrowing. Right neural foramen remains patent. T1-2: Mild disc bulge. Right worse than left facet hypertrophy with right-sided endplate spurring. No spinal stenosis. Moderate right worse than left foraminal narrowing. IMPRESSION: 1. No acute abnormality within the cervical spine. 2. Multilevel cervical spondylosis with resultant mild spinal stenosis at C4-5 and C5-6. No frank cord impingement. 3. Multifactorial degenerative changes with resultant multilevel foraminal narrowing as above. Notable findings include severe left C4 foraminal stenosis, severe right with moderate left C5 foraminal narrowing, severe bilateral C6 foraminal stenosis, with severe left and moderate right C7 foraminal narrowing. Electronically Signed   By: Jeannine Boga M.D.   On: 01/16/2021 01:16    Assessment/Plan Essential hypertension blood pressure control important in reducing the progression of atherosclerotic disease. On appropriate oral medications.     Hyperlipidemia lipid control important in reducing the progression of atherosclerotic disease. Continue statin therapy  Pain in limb She has upcoming orthopedic surgery for hardware issues in her left leg in the next couple of weeks.  This is the predominant cause of her pain at this point not her mild venous disease.  We will just make a 54-monthfollow-up.  Chronic venous insufficiency Fairly mild on her duplex few months ago.  Not the predominant  cause of her pain.  No role for intervention from a vascular standpoint at this time.  Return in 6 months.    JLeotis Pain MD  02/04/2021 11:38 AM    This note was created with Dragon medical transcription system.  Any errors from dictation are purely unintentional

## 2021-02-04 NOTE — Assessment & Plan Note (Signed)
Fairly mild on her duplex few months ago.  Not the predominant cause of her pain.  No role for intervention from a vascular standpoint at this time.  Return in 6 months.

## 2021-02-04 NOTE — Assessment & Plan Note (Signed)
She has upcoming orthopedic surgery for hardware issues in her left leg in the next couple of weeks.  This is the predominant cause of her pain at this point not her mild venous disease.  We will just make a 71-month follow-up.

## 2021-03-26 ENCOUNTER — Other Ambulatory Visit: Payer: Self-pay | Admitting: *Deleted

## 2021-03-26 DIAGNOSIS — D649 Anemia, unspecified: Secondary | ICD-10-CM

## 2021-04-03 ENCOUNTER — Inpatient Hospital Stay: Payer: Managed Care, Other (non HMO)

## 2021-04-04 ENCOUNTER — Other Ambulatory Visit: Payer: Self-pay

## 2021-04-04 ENCOUNTER — Inpatient Hospital Stay: Payer: Managed Care, Other (non HMO) | Attending: Oncology

## 2021-04-04 DIAGNOSIS — E538 Deficiency of other specified B group vitamins: Secondary | ICD-10-CM | POA: Insufficient documentation

## 2021-04-04 DIAGNOSIS — D649 Anemia, unspecified: Secondary | ICD-10-CM

## 2021-04-04 DIAGNOSIS — D509 Iron deficiency anemia, unspecified: Secondary | ICD-10-CM | POA: Insufficient documentation

## 2021-04-04 LAB — CBC WITH DIFFERENTIAL/PLATELET
Abs Immature Granulocytes: 0.02 10*3/uL (ref 0.00–0.07)
Basophils Absolute: 0 10*3/uL (ref 0.0–0.1)
Basophils Relative: 1 %
Eosinophils Absolute: 0.1 10*3/uL (ref 0.0–0.5)
Eosinophils Relative: 1 %
HCT: 30.8 % — ABNORMAL LOW (ref 36.0–46.0)
Hemoglobin: 9.9 g/dL — ABNORMAL LOW (ref 12.0–15.0)
Immature Granulocytes: 0 %
Lymphocytes Relative: 20 %
Lymphs Abs: 1.7 10*3/uL (ref 0.7–4.0)
MCH: 26.8 pg (ref 26.0–34.0)
MCHC: 32.1 g/dL (ref 30.0–36.0)
MCV: 83.2 fL (ref 80.0–100.0)
Monocytes Absolute: 0.6 10*3/uL (ref 0.1–1.0)
Monocytes Relative: 7 %
Neutro Abs: 6.1 10*3/uL (ref 1.7–7.7)
Neutrophils Relative %: 71 %
Platelets: 268 10*3/uL (ref 150–400)
RBC: 3.7 MIL/uL — ABNORMAL LOW (ref 3.87–5.11)
RDW: 14.7 % (ref 11.5–15.5)
WBC: 8.6 10*3/uL (ref 4.0–10.5)
nRBC: 0 % (ref 0.0–0.2)

## 2021-04-04 LAB — IRON AND TIBC
Iron: 26 ug/dL — ABNORMAL LOW (ref 28–170)
Saturation Ratios: 9 % — ABNORMAL LOW (ref 10.4–31.8)
TIBC: 287 ug/dL (ref 250–450)
UIBC: 261 ug/dL

## 2021-04-04 LAB — FOLATE: Folate: 10.2 ng/mL (ref >5.9)

## 2021-04-04 LAB — FERRITIN: Ferritin: 214 ng/mL (ref 11–307)

## 2021-04-04 LAB — VITAMIN B12: Vitamin B-12: 135 pg/mL — ABNORMAL LOW (ref 180–914)

## 2021-04-08 ENCOUNTER — Encounter: Payer: Self-pay | Admitting: Oncology

## 2021-04-08 ENCOUNTER — Inpatient Hospital Stay (HOSPITAL_BASED_OUTPATIENT_CLINIC_OR_DEPARTMENT_OTHER): Payer: Managed Care, Other (non HMO) | Admitting: Oncology

## 2021-04-08 DIAGNOSIS — R634 Abnormal weight loss: Secondary | ICD-10-CM

## 2021-04-08 DIAGNOSIS — E538 Deficiency of other specified B group vitamins: Secondary | ICD-10-CM

## 2021-04-08 DIAGNOSIS — D509 Iron deficiency anemia, unspecified: Secondary | ICD-10-CM

## 2021-04-08 NOTE — Addendum Note (Signed)
Addended by: Luella Cook on: 04/08/2021 07:43 PM   Modules accepted: Orders

## 2021-04-08 NOTE — Progress Notes (Signed)
I connected with Audrey Farley on 04/08/21 at  2:45 PM EST by video enabled telemedicine visit and verified that I am speaking with the correct person using two identifiers.   I discussed the limitations, risks, security and privacy concerns of performing an evaluation and management service by telemedicine and the availability of in-person appointments. I also discussed with the patient that there may be a patient responsible charge related to this service. The patient expressed understanding and agreed to proceed.  Other persons participating in the visit and their role in the encounter: Patient's sister  Patient's location:  work Provider's location:  work  Risk analyst Complaint: Discuss results of blood work  History of present illness: patient is a 61 year old African-American female with a past medical history significant for hypertension hyperlipidemia GERD among other medical problems.  She has been referred to [place] for anemia.Most recent CBC from November 2022 showed a hemoglobin of 11 that was normocytic.  Prior to that her hemoglobin was 10 in July 2022.  B12 levels low at 197 and folate levels which were previously abnormal in July normalized to 7.9.  Serum iron low at 20 and iron saturation 8%.  Patient denies any family history of cancer.  Denies any blood loss in her stool or urine.  Denies any dark melanotic stool.  Denies any consistent use of NSAIDs.  States that she had a colonoscopy in August or September 2022 diagnosis pulmonary not seen colonoscopy reports in the system.  She has not tried oral iron or oral B12.  Reports mild fatigue but denies other complaints at this time.   Interval history patient reports ongoing fatigue.  Her sister is concerned about her ongoing weight loss.Presently it is unclear what her weight is but in November she was 2 was 7 pounds down from 223 pounds in September 2022.  Patient does not remember when she last had a colonoscopy.   Review of Systems   Constitutional:  Positive for malaise/fatigue and weight loss. Negative for chills and fever.  HENT:  Negative for congestion, ear discharge and nosebleeds.   Eyes:  Negative for blurred vision.  Respiratory:  Negative for cough, hemoptysis, sputum production, shortness of breath and wheezing.   Cardiovascular:  Negative for chest pain, palpitations, orthopnea and claudication.  Gastrointestinal:  Negative for abdominal pain, blood in stool, constipation, diarrhea, heartburn, melena, nausea and vomiting.  Genitourinary:  Negative for dysuria, flank pain, frequency, hematuria and urgency.  Musculoskeletal:  Negative for back pain, joint pain and myalgias.  Skin:  Negative for rash.  Neurological:  Negative for dizziness, tingling, focal weakness, seizures, weakness and headaches.  Endo/Heme/Allergies:  Does not bruise/bleed easily.  Psychiatric/Behavioral:  Negative for depression and suicidal ideas. The patient does not have insomnia.    Allergies  Allergen Reactions   Celecoxib Nausea Only   Mirabegron Nausea Only   No Known Allergies    Latex Itching    Past Medical History:  Diagnosis Date   Allergy    Aneurysm of ascending aorta 02/25/2016   3.9cm by echo 02/2016  -aorta enlarged   Arthritis    CAD in native artery 11/19/2020   Chest pain 01/24/2016   Dyspnea on exertion    Hypertension    Urge incontinence     Past Surgical History:  Procedure Laterality Date   ABDOMINAL HYSTERECTOMY     still has cervix; DUB; ovaries intact.   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   TOTAL KNEE ARTHROPLASTY Left  09/28/2016   Procedure: LEFT TOTAL KNEE ARTHROPLASTY;  Surgeon: Vickey Huger, MD;  Location: [facility];  Service: Orthopedics;  Laterality: Left;    Social History   Socioeconomic History   Marital status: Single    Spouse name: Not on file   Number of children: Not on file   Years of education: Not on file   Highest education level: Not on  file  Occupational History   Not on file  Tobacco Use   Smoking status: Never   Smokeless tobacco: Never  Vaping Use   Vaping Use: Never used  Substance and Sexual Activity   Alcohol use: Yes    Comment: occ vodka   Drug use: No   Sexual activity: Yes    Birth control/protection: Surgical, Post-menopausal  Other Topics Concern   Not on file  Social History Narrative   Marital status: single; not dating since 2014      Children: 2 daughters (16, 79); no grandchildren      Lives: with 2 daughters       Employment: tobacco company; Stage manager x 16 years; loves job!  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        Tobacco: none      Alcohol; weekends/socially      Drugs: none      Exercise:  Two days per week; cardio      Seatbelt: 100%; no texting      Sexual activity: not sexually active since 2014; dates males; no STDs; total partners = 7.   Social Determinants of Health   Financial Resource Strain: Low Risk    Difficulty of Paying Living Expenses: Not hard at all  Food Insecurity: No Food Insecurity   Worried About Charity fundraiser in the Last Year: Never true   [place] in the Last Year: Never true  Transportation Needs: No Transportation Needs   Lack of Transportation (Medical): No   Lack of Transportation (Non-Medical): No  Physical Activity: Inactive   Days of Exercise per Week: 0 days   Minutes of Exercise per Session: 0 min  Stress: Not on file  Social Connections: Not on file  Intimate Partner Violence: Not on file    Family History  Problem Relation Age of Onset   Diabetes Mother    Hypertension Mother    Heart attack Mother    Hyperlipidemia Mother    Heart disease Mother        heart problems; no AMI   Kidney disease Mother    Hyperlipidemia Sister    Hypertension Sister    Multiple sclerosis Sister    Mental illness Brother    Epilepsy Brother    Diabetes Sister    Sudden death Neg Hx      Current  Outpatient Medications:    acetaminophen (TYLENOL) 500 MG tablet, Take by mouth., Disp: , Rfl:    albuterol (VENTOLIN HFA) 108 (90 Base) MCG/ACT inhaler, Inhale 2 puffs into the lungs every 4 (four) hours as needed for wheezing or shortness of breath., Disp: , Rfl:    aspirin EC 81 MG tablet, Take 1 tablet (81 mg total) by mouth daily., Disp: 90 tablet, Rfl: 3   atorvastatin (LIPITOR) 20 MG tablet, Take 20 mg by mouth at bedtime., Disp: , Rfl:    cholecalciferol (VITAMIN D3) 25 MCG (1000 UNIT) tablet, Take 1,000 Units by mouth daily., Disp: , Rfl:    diltiazem (CARDIZEM CD) 120 MG 24 hr  capsule, Take 1 capsule (120 mg total) by mouth daily., Disp: 90 capsule, Rfl: 3   HYDROcodone-acetaminophen (NORCO/VICODIN) 5-325 MG tablet, Take 1 tablet by mouth every 4 (four) hours as needed., Disp: 10 tablet, Rfl: 0   methocarbamol (ROBAXIN) 500 MG tablet, Take by mouth., Disp: , Rfl:    pantoprazole (PROTONIX) 40 MG tablet, Take by mouth., Disp: , Rfl:    telmisartan-hydrochlorothiazide (MICARDIS HCT) 80-12.5 MG tablet, Take 1 tablet by mouth daily., Disp: 90 tablet, Rfl: 0   vitamin B-12 (CYANOCOBALAMIN) 1000 MCG tablet, Take 1,000 mcg by mouth daily., Disp: , Rfl:    celecoxib (CELEBREX) 200 MG capsule, Take 200 mg by mouth 2 (two) times daily. (Patient not taking: Reported on 02/04/2021), Disp: , Rfl:    diltiazem (TIAZAC) 120 MG 24 hr capsule, Take by mouth. (Patient not taking: Reported on 01/27/2021), Disp: , Rfl:    ferrous sulfate 325 (65 FE) MG EC tablet, Take by mouth. (Patient not taking: Reported on 01/27/2021), Disp: , Rfl:    folic acid (FOLVITE) 1 MG tablet, Take 1 mg by mouth daily. (Patient not taking: Reported on 01/27/2021), Disp: , Rfl:    guaiFENesin-codeine 100-10 MG/5ML syrup, SMARTSIG:5-10 Milliliter(s) By Mouth 4 Times Daily PRN (Patient not taking: Reported on 01/27/2021), Disp: , Rfl:    methylPREDNISolone (MEDROL DOSEPAK) 4 MG TBPK tablet, Follow package instructions. (Patient not  taking: Reported on 02/04/2021), Disp: 21 tablet, Rfl: 0   rosuvastatin (CRESTOR) 20 MG tablet, Take 1 tablet (20 mg total) by mouth daily. (Patient not taking: Reported on 01/15/2021), Disp: 90 tablet, Rfl: 3  No results found.  No images are attached to the encounter.   CMP Latest Ref Rng & Units 11/19/2020  Glucose 65 - 99 mg/dL 94  BUN 6 - 24 mg/dL 16  Creatinine 0.57 - 1.00 mg/dL 0.99  Sodium 134 - 144 mmol/L 141  Potassium 3.5 - 5.2 mmol/L 4.0  Chloride 96 - 106 mmol/L 103  CO2 20 - 29 mmol/L 23  Calcium 8.7 - 10.2 mg/dL 9.5  Total Protein 6.0 - 8.5 g/dL 6.8  Total Bilirubin 0.0 - 1.2 mg/dL 0.4  Alkaline Phos 44 - 121 IU/L 101  AST 0 - 40 IU/L 11  ALT 0 - 32 IU/L 17   CBC Latest Ref Rng & Units 04/04/2021  WBC 4.0 - 10.5 K/uL 8.6  Hemoglobin 12.0 - 15.0 g/dL 9.9(L)  Hematocrit 36.0 - 46.0 % 30.8(L)  Platelets 150 - 400 K/uL 268     Observation/objective: Appears in no acute distress over video visit today.  Breathing is nonlabored  Assessment and plan: Patient is a 61 year old female and this is a follow-up visit for following issues:  Normocytic anemia:Patient has been on oral iron for a couple of months now.  Although ferritin levels are normal at 214 it is unclear if that is falsely normal as an acute reactive component.  Iron saturation is low at 9% with a low serum iron of 26.  I will therefore give her a trial of IV iron with either 1 dose of Feraheme or 2 doses of Venofer.  Discussed risks and benefits of IV iron including all but not limited to possible risk of infusion reaction.  Patient understands and agrees to proceed as planned.  She also has low B12 levels and I will therefore plan to give her 3 doses of weekly B12 followed by monthly B12 injections.  We will repeat CBC ferritin and iron studies in 2 months and see me  thereafter.  2.  Abnormal weight loss: Patient has never had a colonoscopy and given her ongoing weight loss with concern for iron deficiency I  will refer her to GI for further work-up.  I will also proceed with a CT chest abdomen and pelvis with contrast to rule out any malignancy.  She did have a CT chest in June of last year which did not show any active lung malignancy at that time.  Follow-up instructions: As above  I discussed the assessment and treatment plan with the patient. The patient was provided an opportunity to ask questions and all were answered. The patient agreed with the plan and demonstrated an understanding of the instructions.   The patient was advised to call back or seek an in-person evaluation if the symptoms worsen or if the condition fails to improve as anticipated.    Visit Diagnosis: 1. Iron deficiency anemia, unspecified iron deficiency anemia type   2. B12 deficiency     Dr. Randa Evens, MD, MPH [facility] at [facility] Tel- [phone] 04/08/2021 4:53 PM

## 2021-04-09 ENCOUNTER — Other Ambulatory Visit: Payer: Self-pay | Admitting: Oncology

## 2021-04-10 ENCOUNTER — Telehealth: Payer: Self-pay

## 2021-04-10 NOTE — Telephone Encounter (Signed)
Scheduled for 06/25/2021

## 2021-04-15 ENCOUNTER — Inpatient Hospital Stay: Payer: Managed Care, Other (non HMO) | Attending: Oncology

## 2021-04-15 ENCOUNTER — Other Ambulatory Visit: Payer: Self-pay

## 2021-04-15 VITALS — BP 111/69 | Resp 20

## 2021-04-15 DIAGNOSIS — E538 Deficiency of other specified B group vitamins: Secondary | ICD-10-CM | POA: Insufficient documentation

## 2021-04-15 DIAGNOSIS — D509 Iron deficiency anemia, unspecified: Secondary | ICD-10-CM | POA: Insufficient documentation

## 2021-04-15 MED ORDER — CYANOCOBALAMIN 1000 MCG/ML IJ SOLN
1000.0000 ug | INTRAMUSCULAR | Status: DC
  Administered 2021-04-15: 1000 ug via INTRAMUSCULAR
  Filled 2021-04-15: qty 1

## 2021-04-15 MED ORDER — SODIUM CHLORIDE 0.9 % IV SOLN: 200.0000 mg | INTRAVENOUS | Status: DC

## 2021-04-15 MED ORDER — SODIUM CHLORIDE 0.9 % IV SOLN
Freq: Once | INTRAVENOUS | Status: AC
  Filled 2021-04-15: qty 250

## 2021-04-15 MED ORDER — IRON SUCROSE 20 MG/ML IV SOLN
200.0000 mg | Freq: Once | INTRAVENOUS | Status: AC
  Administered 2021-04-15: 200 mg via INTRAVENOUS
  Filled 2021-04-15: qty 10

## 2021-04-15 NOTE — Patient Instructions (Signed)
[facility] AT [place]  Discharge Instructions: Thank you for choosing [facility] to provide your oncology and hematology care.  If you have a lab appointment with the [facility], please go directly to the [facility] and check in at the registration area.  Wear comfortable clothing and clothing appropriate for easy access to any Portacath or PICC line.   We strive to give you quality time with your provider. You may need to reschedule your appointment if you arrive late (15 or more minutes).  Arriving late affects you and other patients whose appointments are after yours.  Also, if you miss three or more appointments without notifying the office, you may be dismissed from the clinic at the providers discretion.      For prescription refill requests, have your pharmacy contact our office and allow 72 hours for refills to be completed.    Today you received the following : Venofer / Vit B12 injection       Iron Sucrose Injection What is this medication? IRON SUCROSE (EYE ern SOO krose) treats low levels of iron (iron deficiency anemia) in people with kidney disease. Iron is a mineral that plays an important role in making red blood cells, which carry oxygen from your lungs to the rest of your body. This medicine may be used for other purposes; ask your health care provider or pharmacist if you have questions. COMMON BRAND NAME(S): Venofer What should I tell my care team before I take this medication? They need to know if you have any of these conditions: Anemia not caused by low iron levels Heart disease High levels of iron in the blood Kidney disease Liver disease An unusual or allergic reaction to iron, other medications, foods, dyes, or preservatives Pregnant or trying to get pregnant Breast-feeding How should I use this medication? This medication is for infusion into a vein. It is given in a hospital or clinic setting. Talk to your care  team about the use of this medication in children. While this medication may be prescribed for children as young as 2 years for selected conditions, precautions do apply. Overdosage: If you think you have taken too much of this medicine contact a poison control center or emergency room at once. NOTE: This medicine is only for you. Do not share this medicine with others. What if I miss a dose? It is important not to miss your dose. Call your care team if you are unable to keep an appointment. What may interact with this medication? Do not take this medication with any of the following: Deferoxamine Dimercaprol Other iron products This medication may also interact with the following: Chloramphenicol Deferasirox This list may not describe all possible interactions. Give your health care provider a list of all the medicines, herbs, non-prescription drugs, or dietary supplements you use. Also tell them if you smoke, drink alcohol, or use illegal drugs. Some items may interact with your medicine. What should I watch for while using this medication? Visit your care team regularly. Tell your care team if your symptoms do not start to get better or if they get worse. You may need blood work done while you are taking this medication. You may need to follow a special diet. Talk to your care team. Foods that contain iron include: whole grains/cereals, dried fruits, beans, or peas, leafy green vegetables, and organ meats (liver, kidney). What side effects may I notice from receiving this medication? Side effects that you should report to  your care team as soon as possible: Allergic reactions--skin rash, itching, hives, swelling of the face, lips, tongue, or throat Low blood pressure--dizziness, feeling faint or lightheaded, blurry vision Shortness of breath Side effects that usually do not require medical attention (report to your care team if they continue or are bothersome): Flushing Headache Joint  pain Muscle pain Nausea Pain, redness, or irritation at injection site This list may not describe all possible side effects. Call your doctor for medical advice about side effects. You may report side effects to FDA at 1-800-FDA-1088. Where should I keep my medication? This medication is given in a hospital or clinic and will not be stored at home. NOTE: This sheet is a summary. It may not cover all possible information. If you have questions about this medicine, talk to your doctor, pharmacist, or health care provider.  2022 Elsevier/Gold Standard (2020-07-19 00:00:00)    To help prevent nausea and vomiting after your treatment, we encourage you to take your nausea medication as directed.  BELOW ARE SYMPTOMS THAT SHOULD BE REPORTED IMMEDIATELY: *FEVER GREATER THAN 100.4 F (38 C) OR HIGHER *CHILLS OR SWEATING *NAUSEA AND VOMITING THAT IS NOT CONTROLLED WITH YOUR NAUSEA MEDICATION *UNUSUAL SHORTNESS OF BREATH *UNUSUAL BRUISING OR BLEEDING *URINARY PROBLEMS (pain or burning when urinating, or frequent urination) *BOWEL PROBLEMS (unusual diarrhea, constipation, pain near the anus) TENDERNESS IN MOUTH AND THROAT WITH OR WITHOUT PRESENCE OF ULCERS (sore throat, sores in mouth, or a toothache) UNUSUAL RASH, SWELLING OR PAIN  UNUSUAL VAGINAL DISCHARGE OR ITCHING   Items with * indicate a potential emergency and should be followed up as soon as possible or go to the Emergency Department if any problems should occur.  Please show the CHEMOTHERAPY ALERT CARD or IMMUNOTHERAPY ALERT CARD at check-in to the Emergency Department and triage nurse.  Should you have questions after your visit or need to cancel or reschedule your appointment, please contact [facility] [place] AT [place]  [phone] and follow the prompts.  Office hours are 8:00 a.m. to 4:30 p.m. Monday - Friday. Please note that voicemails left after 4:00 p.m. may not be returned until the following business day.  We  are closed weekends and major holidays. You have access to a nurse at all times for urgent questions. Please call the main number to the clinic [phone] and follow the prompts.  For any non-urgent questions, you may also contact your provider using MyChart. We now offer e-Visits for anyone 23 and older to request care online for non-urgent symptoms. For details visit [email or web address].   Also download the MyChart app! Go to the app store, search "MyChart", open the app, select [facility], and log in with your MyChart username and password.  Due to Covid, a mask is required upon entering the hospital/clinic. If you do not have a mask, one will be given to you upon arrival. For doctor visits, patients may have 1 support person aged 19 or older with them. For treatment visits, patients cannot have anyone with them due to current Covid guidelines and our immunocompromised population.

## 2021-04-22 ENCOUNTER — Inpatient Hospital Stay: Payer: Managed Care, Other (non HMO)

## 2021-04-22 ENCOUNTER — Other Ambulatory Visit: Payer: Self-pay

## 2021-04-22 VITALS — BP 118/80 | HR 76 | Temp 98.2°F | Resp 20

## 2021-04-22 DIAGNOSIS — D509 Iron deficiency anemia, unspecified: Secondary | ICD-10-CM | POA: Diagnosis not present

## 2021-04-22 DIAGNOSIS — E538 Deficiency of other specified B group vitamins: Secondary | ICD-10-CM

## 2021-04-22 MED ORDER — SODIUM CHLORIDE 0.9 % IV SOLN
Freq: Once | INTRAVENOUS | Status: AC
  Filled 2021-04-22: qty 250

## 2021-04-22 MED ORDER — CYANOCOBALAMIN 1000 MCG/ML IJ SOLN
1000.0000 ug | INTRAMUSCULAR | Status: DC
  Administered 2021-04-22: 1000 ug via INTRAMUSCULAR
  Filled 2021-04-22: qty 1

## 2021-04-22 MED ORDER — SODIUM CHLORIDE 0.9 % IV SOLN: 200.0000 mg | INTRAVENOUS | Status: DC

## 2021-04-22 MED ORDER — IRON SUCROSE 20 MG/ML IV SOLN
200.0000 mg | Freq: Once | INTRAVENOUS | Status: AC
  Administered 2021-04-22: 200 mg via INTRAVENOUS
  Filled 2021-04-22: qty 10

## 2021-04-22 NOTE — Patient Instructions (Signed)
Vitamin B12 Injection ?What is this medication? ?Vitamin B12 (VAHY tuh min B12) prevents and treats low vitamin B12 levels in your body. It is used in people who do not get enough vitamin B12 from their diet or when their digestive tract does not absorb enough. Vitamin B12 plays an important role in maintaining the health of your nervous system and red blood cells. ?This medicine may be used for other purposes; ask your health care provider or pharmacist if you have questions. ?COMMON BRAND NAME(S): B-12 Compliance Kit, B-12 Injection Kit, Cyomin, Dodex, LA-12, Nutri-Twelve, Physicians EZ Use B-12, Primabalt ?What should I tell my care team before I take this medication? ?They need to know if you have any of these conditions: ?Kidney disease ?Leber's disease ?Megaloblastic anemia ?An unusual or allergic reaction to cyanocobalamin, cobalt, other medications, foods, dyes, or preservatives ?Pregnant or trying to get pregnant ?Breast-feeding ?How should I use this medication? ?This medication is injected into a muscle or deeply under the skin. It is usually given in a clinic or care team's office. However, your care team may teach you how to inject yourself. Follow all instructions. ?Talk to your care team about the use of this medication in children. Special care may be needed. ?Overdosage: If you think you have taken too much of this medicine contact a poison control center or emergency room at once. ?NOTE: This medicine is only for you. Do not share this medicine with others. ?What if I miss a dose? ?If you are given your dose at a clinic or care team's office, call to reschedule your appointment. If you give your own injections, and you miss a dose, take it as soon as you can. If it is almost time for your next dose, take only that dose. Do not take double or extra doses. ?What may interact with this medication? ?Colchicine ?Heavy alcohol intake ?This list may not describe all possible interactions. Give your health  care provider a list of all the medicines, herbs, non-prescription drugs, or dietary supplements you use. Also tell them if you smoke, drink alcohol, or use illegal drugs. Some items may interact with your medicine. ?What should I watch for while using this medication? ?Visit your care team regularly. You may need blood work done while you are taking this medication. ?You may need to follow a special diet. Talk to your care team. Limit your alcohol intake and avoid smoking to get the best benefit. ?What side effects may I notice from receiving this medication? ?Side effects that you should report to your care team as soon as possible: ?Allergic reactions--skin rash, itching, hives, swelling of the face, lips, tongue, or throat ?Swelling of the ankles, hands, or feet ?Trouble breathing ?Side effects that usually do not require medical attention (report to your care team if they continue or are bothersome): ?Diarrhea ?This list may not describe all possible side effects. Call your doctor for medical advice about side effects. You may report side effects to FDA at 1-800-FDA-1088. ?Where should I keep my medication? ?Keep out of the reach of children. ?Store at room temperature between 15 and 30 degrees C (59 and 85 degrees F). Protect from light. Throw away any unused medication after the expiration date. ?NOTE: This sheet is a summary. It may not cover all possible information. If you have questions about this medicine, talk to your doctor, pharmacist, or health care provider. ?? 2022 Elsevier/Gold Standard (2020-05-08 00:00:00) ?Iron Sucrose Injection ?What is this medication? ?IRON SUCROSE (EYE ern SOO   krose) treats low levels of iron (iron deficiency anemia) in people with kidney disease. Iron is a mineral that plays an important role in making red blood cells, which carry oxygen from your lungs to the rest of your body. ?This medicine may be used for other purposes; ask your health care provider or pharmacist if  you have questions. ?COMMON BRAND NAME(S): Venofer ?What should I tell my care team before I take this medication? ?They need to know if you have any of these conditions: ?Anemia not caused by low iron levels ?Heart disease ?High levels of iron in the blood ?Kidney disease ?Liver disease ?An unusual or allergic reaction to iron, other medications, foods, dyes, or preservatives ?Pregnant or trying to get pregnant ?Breast-feeding ?How should I use this medication? ?This medication is for infusion into a vein. It is given in a hospital or clinic setting. ?Talk to your care team about the use of this medication in children. While this medication may be prescribed for children as young as 2 years for selected conditions, precautions do apply. ?Overdosage: If you think you have taken too much of this medicine contact a poison control center or emergency room at once. ?NOTE: This medicine is only for you. Do not share this medicine with others. ?What if I miss a dose? ?It is important not to miss your dose. Call your care team if you are unable to keep an appointment. ?What may interact with this medication? ?Do not take this medication with any of the following: ?Deferoxamine ?Dimercaprol ?Other iron products ?This medication may also interact with the following: ?Chloramphenicol ?Deferasirox ?This list may not describe all possible interactions. Give your health care provider a list of all the medicines, herbs, non-prescription drugs, or dietary supplements you use. Also tell them if you smoke, drink alcohol, or use illegal drugs. Some items may interact with your medicine. ?What should I watch for while using this medication? ?Visit your care team regularly. Tell your care team if your symptoms do not start to get better or if they get worse. You may need blood work done while you are taking this medication. ?You may need to follow a special diet. Talk to your care team. Foods that contain iron include: whole  grains/cereals, dried fruits, beans, or peas, leafy green vegetables, and organ meats (liver, kidney). ?What side effects may I notice from receiving this medication? ?Side effects that you should report to your care team as soon as possible: ?Allergic reactions--skin rash, itching, hives, swelling of the face, lips, tongue, or throat ?Low blood pressure--dizziness, feeling faint or lightheaded, blurry vision ?Shortness of breath ?Side effects that usually do not require medical attention (report to your care team if they continue or are bothersome): ?Flushing ?Headache ?Joint pain ?Muscle pain ?Nausea ?Pain, redness, or irritation at injection site ?This list may not describe all possible side effects. Call your doctor for medical advice about side effects. You may report side effects to FDA at 1-800-FDA-1088. ?Where should I keep my medication? ?This medication is given in a hospital or clinic and will not be stored at home. ?NOTE: This sheet is a summary. It may not cover all possible information. If you have questions about this medicine, talk to your doctor, pharmacist, or health care provider. ?? 2022 Elsevier/Gold Standard (2020-07-19 00:00:00) ? ?

## 2021-04-23 ENCOUNTER — Ambulatory Visit: Admission: RE | Admit: 2021-04-23 | Payer: Managed Care, Other (non HMO) | Source: Ambulatory Visit

## 2021-04-24 ENCOUNTER — Telehealth: Payer: Self-pay | Admitting: *Deleted

## 2021-04-24 NOTE — Telephone Encounter (Addendum)
Message from [facility] rad scheduler stating that they need an ordered faxed to them for the CT C/A/P with contrast that was called in to them by [place] Fax # [phone] ASAP Scheduled for Monday morning

## 2021-04-25 ENCOUNTER — Encounter: Payer: Self-pay | Admitting: Oncology

## 2021-04-25 NOTE — Telephone Encounter (Signed)
I got a call from Audrey Farley. She was told that Audrey Farley was requiring pt to get ct scan at a [facility] facility. Audrey Farley said it will be in [place].She needed the order and signature and I faxed it and it went through. Later I got another call from [place] that she did not see it. I did email it to her and I asked if pt. Has been notified and according to their information that pt., Audrey Farley and the [facility] person on [address] call and pt knows the location and ct scan to be done on Monday 2/20

## 2021-04-25 NOTE — Telephone Encounter (Signed)
Can you look into this?

## 2021-04-28 ENCOUNTER — Encounter: Payer: Self-pay | Admitting: Oncology

## 2021-04-29 ENCOUNTER — Inpatient Hospital Stay: Payer: Managed Care, Other (non HMO)

## 2021-04-29 ENCOUNTER — Other Ambulatory Visit: Payer: Self-pay

## 2021-04-29 DIAGNOSIS — D509 Iron deficiency anemia, unspecified: Secondary | ICD-10-CM | POA: Diagnosis not present

## 2021-04-29 DIAGNOSIS — E538 Deficiency of other specified B group vitamins: Secondary | ICD-10-CM

## 2021-04-29 MED ORDER — CYANOCOBALAMIN 1000 MCG/ML IJ SOLN
1000.0000 ug | INTRAMUSCULAR | Status: DC
  Administered 2021-04-29: 1000 ug via INTRAMUSCULAR
  Filled 2021-04-29: qty 1

## 2021-05-06 ENCOUNTER — Inpatient Hospital Stay: Payer: Managed Care, Other (non HMO)

## 2021-05-06 ENCOUNTER — Other Ambulatory Visit: Payer: Self-pay

## 2021-05-06 DIAGNOSIS — E538 Deficiency of other specified B group vitamins: Secondary | ICD-10-CM

## 2021-05-06 DIAGNOSIS — D509 Iron deficiency anemia, unspecified: Secondary | ICD-10-CM | POA: Diagnosis not present

## 2021-05-06 MED ORDER — CYANOCOBALAMIN 1000 MCG/ML IJ SOLN
1000.0000 ug | INTRAMUSCULAR | Status: DC
  Administered 2021-05-06: 1000 ug via INTRAMUSCULAR
  Filled 2021-05-06: qty 1

## 2021-05-07 ENCOUNTER — Inpatient Hospital Stay: Payer: Managed Care, Other (non HMO)

## 2021-05-28 ENCOUNTER — Telehealth: Payer: Self-pay | Admitting: *Deleted

## 2021-05-28 NOTE — Telephone Encounter (Signed)
Patient called stating that she had labs st [facility] and was told that her Iron is still low and would like for her to hav 2 more iron infusion before her surgery on 06/17/21. Please advise. ? ?CBC ?Specimen:  Blood ? Ref Range & Units 2 d ago  ?WBC 4.4 - 11.0 x 10*3/uL 6.4   ?RBC 4.10 - 5.10 x 10*6/uL 3.86 Low    ?Hemoglobin 12.3 - 15.3 G/DL 10.6 Low    ?Hematocrit 35.9 - 44.6 % 31.3 Low    ?MCV 80.0 - 96.0 FL 81.2   ?MCH 27.5 - 33.2 PG 27.4 Low    ?MCHC 33.0 - 37.0 G/DL 33.8   ?RDW 12.3 - 17.0 % 16.1   ?Platelets 150 - 450 X 10*3/uL 352   ?MPV 6.8 - 10.2 FL 6.9   ?Resulting Agency  [place]  ?Specimen Collected: 05/26/21 13:36 Last Resulted: 05/26/21 15:44  ?Received From: [place]  Result Received: 05/28/21 10:40  ? ?

## 2021-05-29 ENCOUNTER — Other Ambulatory Visit: Payer: Self-pay | Admitting: Oncology

## 2021-05-29 ENCOUNTER — Encounter: Payer: Self-pay | Admitting: Oncology

## 2021-05-29 ENCOUNTER — Inpatient Hospital Stay: Payer: Managed Care, Other (non HMO) | Attending: Oncology

## 2021-05-29 ENCOUNTER — Other Ambulatory Visit: Payer: Self-pay

## 2021-05-29 ENCOUNTER — Inpatient Hospital Stay: Payer: Managed Care, Other (non HMO)

## 2021-05-29 VITALS — BP 128/74 | HR 63

## 2021-05-29 DIAGNOSIS — E538 Deficiency of other specified B group vitamins: Secondary | ICD-10-CM

## 2021-05-29 DIAGNOSIS — R634 Abnormal weight loss: Secondary | ICD-10-CM

## 2021-05-29 DIAGNOSIS — D509 Iron deficiency anemia, unspecified: Secondary | ICD-10-CM

## 2021-05-29 LAB — IRON AND TIBC
Iron: 58 ug/dL (ref 28–170)
Saturation Ratios: 19 % (ref 10.4–31.8)
TIBC: 302 ug/dL (ref 250–450)
UIBC: 244 ug/dL

## 2021-05-29 LAB — VITAMIN B12: Vitamin B-12: 246 pg/mL (ref 180–914)

## 2021-05-29 LAB — CBC
HCT: 32.2 % — ABNORMAL LOW (ref 36.0–46.0)
Hemoglobin: 10.3 g/dL — ABNORMAL LOW (ref 12.0–15.0)
MCH: 27.1 pg (ref 26.0–34.0)
MCHC: 32 g/dL (ref 30.0–36.0)
MCV: 84.7 fL (ref 80.0–100.0)
Platelets: 307 10*3/uL (ref 150–400)
RBC: 3.8 MIL/uL — ABNORMAL LOW (ref 3.87–5.11)
RDW: 15.1 % (ref 11.5–15.5)
WBC: 6.8 10*3/uL (ref 4.0–10.5)
nRBC: 0 % (ref 0.0–0.2)

## 2021-05-29 LAB — FERRITIN: Ferritin: 245 ng/mL (ref 11–307)

## 2021-05-29 MED ORDER — SODIUM CHLORIDE 0.9 % IV SOLN
Freq: Once | INTRAVENOUS | Status: AC
  Filled 2021-05-29: qty 250

## 2021-05-29 MED ORDER — IRON SUCROSE 20 MG/ML IV SOLN
200.0000 mg | Freq: Once | INTRAVENOUS | Status: AC
  Administered 2021-05-29: 200 mg via INTRAVENOUS
  Filled 2021-05-29: qty 10

## 2021-05-29 MED ORDER — SODIUM CHLORIDE 0.9 % IV SOLN: 200.0000 mg | INTRAVENOUS | Status: DC

## 2021-05-29 NOTE — Telephone Encounter (Signed)
Pt having labs now and 1 dose of venofer and based on the labs we may need to give her more iron inf. Pt has been told that she will get a call about future venofer based on labs ?

## 2021-05-29 NOTE — Telephone Encounter (Signed)
Check ferritin and iron studies this week. Schedule for 2 doses of venofer. Further doses to be based on iron studies ?

## 2021-05-29 NOTE — Progress Notes (Signed)
Audrey Farley called the pt to let her know that she will get the 2nd iron on Monday but she is not iron deficient based on labs but due to going to have knee surgery she will get 2 doses of venofer ?

## 2021-06-02 ENCOUNTER — Other Ambulatory Visit: Payer: Self-pay

## 2021-06-02 ENCOUNTER — Inpatient Hospital Stay: Payer: Managed Care, Other (non HMO)

## 2021-06-02 VITALS — BP 128/72 | HR 83 | Resp 18

## 2021-06-02 DIAGNOSIS — D509 Iron deficiency anemia, unspecified: Secondary | ICD-10-CM | POA: Diagnosis not present

## 2021-06-02 DIAGNOSIS — E538 Deficiency of other specified B group vitamins: Secondary | ICD-10-CM

## 2021-06-02 MED ORDER — SODIUM CHLORIDE 0.9 % IV SOLN
Freq: Once | INTRAVENOUS | Status: AC
  Filled 2021-06-02: qty 250

## 2021-06-02 MED ORDER — IRON SUCROSE 20 MG/ML IV SOLN
200.0000 mg | Freq: Once | INTRAVENOUS | Status: AC
  Administered 2021-06-02: 200 mg via INTRAVENOUS
  Filled 2021-06-02: qty 10

## 2021-06-02 MED ORDER — SODIUM CHLORIDE 0.9 % IV SOLN: 200.0000 mg | INTRAVENOUS | Status: DC

## 2021-06-02 MED ORDER — CYANOCOBALAMIN 1000 MCG/ML IJ SOLN
1000.0000 ug | INTRAMUSCULAR | Status: DC
  Administered 2021-06-02: 1000 ug via INTRAMUSCULAR
  Filled 2021-06-02: qty 1

## 2021-06-02 NOTE — Patient Instructions (Signed)
[facility]  Discharge Instructions: ?Thank you for choosing [facility] to provide your oncology and hematology care.  ?If you have a lab appointment with the Cancer Center, please go directly to the Cancer Center and check in at the registration area. ? ?Wear comfortable clothing and clothing appropriate for easy access to any Portacath or PICC line.  ? ?We strive to give you quality time with your provider. You may need to reschedule your appointment if you arrive late (15 or more minutes).  Arriving late affects you and other patients whose appointments are after yours.  Also, if you miss three or more appointments without notifying the office, you may be dismissed from the clinic at the provider?s discretion.    ?  ?For prescription refill requests, have your pharmacy contact our office and allow 72 hours for refills to be completed.   ? ?Today you received the following chemotherapy and/or immunotherapy agents VENOFER    ?  ?To help prevent nausea and vomiting after your treatment, we encourage you to take your nausea medication as directed. ? ?BELOW ARE SYMPTOMS THAT SHOULD BE REPORTED IMMEDIATELY: ?*FEVER GREATER THAN 100.4 F (38 ?C) OR HIGHER ?*CHILLS OR SWEATING ?*NAUSEA AND VOMITING THAT IS NOT CONTROLLED WITH YOUR NAUSEA MEDICATION ?*UNUSUAL SHORTNESS OF BREATH ?*UNUSUAL BRUISING OR BLEEDING ?*URINARY PROBLEMS (pain or burning when urinating, or frequent urination) ?*BOWEL PROBLEMS (unusual diarrhea, constipation, pain near the anus) ?TENDERNESS IN MOUTH AND THROAT WITH OR WITHOUT PRESENCE OF ULCERS (sore throat, sores in mouth, or a toothache) ?UNUSUAL RASH, SWELLING OR PAIN  ?UNUSUAL VAGINAL DISCHARGE OR ITCHING  ? ?Items with * indicate a potential emergency and should be followed up as soon as possible or go to the Emergency Department if any problems should occur. ? ?Please show the CHEMOTHERAPY ALERT CARD or IMMUNOTHERAPY ALERT CARD at check-in to the  Emergency Department and triage nurse. ? ?Should you have questions after your visit or need to cancel or reschedule your appointment, please contact [facility]  [phone] and follow the prompts.  Office hours are 8:00 a.m. to 4:30 p.m. Monday - Friday. Please note that voicemails left after 4:00 p.m. may not be returned until the following business day.  We are closed weekends and major holidays. You have access to a nurse at all times for urgent questions. Please call the main number to the clinic [phone] and follow the prompts. ? ?For any non-urgent questions, you may also contact your provider using MyChart. We now offer e-Visits for anyone 18 and older to request care online for non-urgent symptoms. For details visit [email or web address]. ?  ?Also download the MyChart app! Go to the app store, search "MyChart", open the app, select [facility], and log in with your MyChart username and password. ? ?Due to Covid, a mask is required upon entering the hospital/clinic. If you do not have a mask, one will be given to you upon arrival. For doctor visits, patients may have 1 support person aged 18 or older with them. For treatment visits, patients cannot have anyone with them due to current Covid guidelines and our immunocompromised population.  ? ?Iron Sucrose Injection ?What is this medication? ?IRON SUCROSE (EYE ern SOO krose) treats low levels of iron (iron deficiency anemia) in people with kidney disease. Iron is a mineral that plays an important role in making red blood cells, which carry oxygen from your lungs to the rest of your body. ?This medicine may   be used for other purposes; ask your health care provider or pharmacist if you have questions. ?COMMON BRAND NAME(S): Venofer ?What should I tell my care team before I take this medication? ?They need to know if you have any of these conditions: ?Anemia not caused by low iron levels ?Heart disease ?High levels of  iron in the blood ?Kidney disease ?Liver disease ?An unusual or allergic reaction to iron, other medications, foods, dyes, or preservatives ?Pregnant or trying to get pregnant ?Breast-feeding ?How should I use this medication? ?This medication is for infusion into a vein. It is given in a hospital or clinic setting. ?Talk to your care team about the use of this medication in children. While this medication may be prescribed for children as young as 2 years for selected conditions, precautions do apply. ?Overdosage: If you think you have taken too much of this medicine contact a poison control center or emergency room at once. ?NOTE: This medicine is only for you. Do not share this medicine with others. ?What if I miss a dose? ?It is important not to miss your dose. Call your care team if you are unable to keep an appointment. ?What may interact with this medication? ?Do not take this medication with any of the following: ?Deferoxamine ?Dimercaprol ?Other iron products ?This medication may also interact with the following: ?Chloramphenicol ?Deferasirox ?This list may not describe all possible interactions. Give your health care provider a list of all the medicines, herbs, non-prescription drugs, or dietary supplements you use. Also tell them if you smoke, drink alcohol, or use illegal drugs. Some items may interact with your medicine. ?What should I watch for while using this medication? ?Visit your care team regularly. Tell your care team if your symptoms do not start to get better or if they get worse. You may need blood work done while you are taking this medication. ?You may need to follow a special diet. Talk to your care team. Foods that contain iron include: whole grains/cereals, dried fruits, beans, or peas, leafy green vegetables, and organ meats (liver, kidney). ?What side effects may I notice from receiving this medication? ?Side effects that you should report to your care team as soon as  possible: ?Allergic reactions--skin rash, itching, hives, swelling of the face, lips, tongue, or throat ?Low blood pressure--dizziness, feeling faint or lightheaded, blurry vision ?Shortness of breath ?Side effects that usually do not require medical attention (report to your care team if they continue or are bothersome): ?Flushing ?Headache ?Joint pain ?Muscle pain ?Nausea ?Pain, redness, or irritation at injection site ?This list may not describe all possible side effects. Call your doctor for medical advice about side effects. You may report side effects to FDA at 1-800-FDA-1088. ?Where should I keep my medication? ?This medication is given in a hospital or clinic and will not be stored at home. ?NOTE: This sheet is a summary. It may not cover all possible information. If you have questions about this medicine, talk to your doctor, pharmacist, or health care provider. ?? 2022 Elsevier/Gold Standard (2020-07-19 00:00:00) ? ?

## 2021-06-03 ENCOUNTER — Other Ambulatory Visit: Payer: Self-pay | Admitting: *Deleted

## 2021-06-09 ENCOUNTER — Encounter: Payer: Self-pay | Admitting: Oncology

## 2021-06-09 ENCOUNTER — Other Ambulatory Visit: Payer: Managed Care, Other (non HMO)

## 2021-06-09 ENCOUNTER — Inpatient Hospital Stay: Payer: Managed Care, Other (non HMO) | Attending: Oncology | Admitting: Oncology

## 2021-06-09 ENCOUNTER — Inpatient Hospital Stay: Payer: Managed Care, Other (non HMO)

## 2021-06-09 VITALS — BP 130/84 | HR 74 | Resp 18 | Wt 203.8 lb

## 2021-06-09 DIAGNOSIS — E538 Deficiency of other specified B group vitamins: Secondary | ICD-10-CM | POA: Diagnosis not present

## 2021-06-09 DIAGNOSIS — Z79899 Other long term (current) drug therapy: Secondary | ICD-10-CM | POA: Insufficient documentation

## 2021-06-09 DIAGNOSIS — D638 Anemia in other chronic diseases classified elsewhere: Secondary | ICD-10-CM | POA: Diagnosis not present

## 2021-06-09 DIAGNOSIS — D509 Iron deficiency anemia, unspecified: Secondary | ICD-10-CM | POA: Insufficient documentation

## 2021-06-09 NOTE — Progress Notes (Signed)
? ? ? ?Hematology/Oncology Consult note ?[facility]  ?[phone] [phone] ? ?Patient Care Team: ?Gae Bon, NP as PCP - General (Nurse Practitioner) ?Skeet Latch, MD as PCP - Cardiology (Cardiology) ?Sindy Guadeloupe, MD as Consulting Physician (Hematology and Oncology)  ? ?Name of the patient: Audrey Farley  ?[id number]  ?March 22, 1960  ? ?Date of visit: 06/09/21 ? ?Diagnosis-normocytic anemia likely multifactorial secondary to chronic disease as well as component of iron and B12 deficiency ? ?Chief complaint/ Reason for visit-routine follow-up of anemia ? ?Heme/Onc history: patient is a 61 year old African-American female with a past medical history significant for hypertension hyperlipidemia GERD among other medical problems.  She has been referred to [place] for anemia.Most recent CBC from November 2022 showed a hemoglobin of 11 that was normocytic.  Prior to that her hemoglobin was 10 in July 2022.  B12 levels low at 197 and folate levels which were previously abnormal in July normalized to 7.9.  Serum iron low at 20 and iron saturation 8%.  Patient denies any family history of cancer.  Denies any blood loss in her stool or urine.  Denies any dark melanotic stool.  Denies any consistent use of NSAIDs.  States that she had a colonoscopy in August or September 2022 diagnosis pulmonary not seen colonoscopy reports in the system.  She has not tried oral iron or oral B12.  Reports mild fatigue but denies other complaints at this time. ? ?Interval history-she has a revision surgery of her left knee coming up this month.  Still reports ongoing fatigue. ? ?ECOG PS- 1 ?Pain scale- 4 ? ? ?Review of systems- Review of Systems  ?Constitutional:  Positive for malaise/fatigue. Negative for chills, fever and weight loss.  ?HENT:  Negative for congestion, ear discharge and nosebleeds.   ?Eyes:  Negative for blurred vision.  ?Respiratory:  Negative for cough, hemoptysis,  sputum production, shortness of breath and wheezing.   ?Cardiovascular:  Negative for chest pain, palpitations, orthopnea and claudication.  ?Gastrointestinal:  Negative for abdominal pain, blood in stool, constipation, diarrhea, heartburn, melena, nausea and vomiting.  ?Genitourinary:  Negative for dysuria, flank pain, frequency, hematuria and urgency.  ?Musculoskeletal:  Positive for joint pain. Negative for back pain and myalgias.  ?Skin:  Negative for rash.  ?Neurological:  Negative for dizziness, tingling, focal weakness, seizures, weakness and headaches.  ?Endo/Heme/Allergies:  Does not bruise/bleed easily.  ?Psychiatric/Behavioral:  Negative for depression and suicidal ideas. The patient does not have insomnia.    ? ? ? ?Allergies  ?Allergen Reactions  ? Celecoxib Nausea Only  ? Mirabegron Nausea Only  ? No Known Allergies   ? Latex Itching  ? ? ? ?Past Medical History:  ?Diagnosis Date  ? Allergy   ? Aneurysm of ascending aorta (HCC) 02/25/2016  ? 3.9cm by echo 02/2016  -aorta enlarged  ? Arthritis   ? CAD in native artery 11/19/2020  ? Chest pain 01/24/2016  ? Dyspnea on exertion   ? Hypertension   ? Urge incontinence   ? ? ? ?Past Surgical History:  ?Procedure Laterality Date  ? ABDOMINAL HYSTERECTOMY    ? still has cervix; DUB; ovaries intact.  ? KNEE ARTHROSCOPY Left 2012  ? TOTAL KNEE ARTHROPLASTY Left 09/28/2016  ? TOTAL KNEE ARTHROPLASTY Left 09/28/2016  ? Procedure: LEFT TOTAL KNEE ARTHROPLASTY;  Surgeon: Vickey Huger, MD;  Location: [facility];  Service: Orthopedics;  Laterality: Left;  ? ? ?Social History  ? ?Socioeconomic History  ? Marital status: Single  ?  Spouse name: Not on file  ? Number of children: Not on file  ? Years of education: Not on file  ? Highest education level: Not on file  ?Occupational History  ? Not on file  ?Tobacco Use  ? Smoking status: Never  ? Smokeless tobacco: Never  ?Vaping Use  ? Vaping Use: Never used  ?Substance and Sexual Activity  ? Alcohol use: Yes  ?  Comment: occ  vodka  ? Drug use: No  ? Sexual activity: Yes  ?  Birth control/protection: Surgical, Post-menopausal  ?Other Topics Concern  ? Not on file  ?Social History Narrative  ? Marital status: single; not dating since 2014  ?    Children: 2 daughters (16, 59); no grandchildren  ?    Lives: with 2 daughters   ?    Employment: tobacco company; Stage manager x 16 years; loves job!  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    ?    Tobacco: none  ?    Alcohol; weekends/socially  ?    Drugs: none  ?    Exercise:  Two days per week; cardio  ?    Seatbelt: 100%; no texting  ?    Sexual activity: not sexually active since 2014; dates males; no STDs; total partners = 7.  ? ?Social Determinants of Health  ? ?Financial Resource Strain: Low Risk   ? Difficulty of Paying Living Expenses: Not hard at all  ?Food Insecurity: No Food Insecurity  ? Worried About Charity fundraiser in the Last Year: Never true  ? Ran Out of Food in the Last Year: Never true  ?Transportation Needs: No Transportation Needs  ? Lack of Transportation (Medical): No  ? Lack of Transportation (Non-Medical): No  ?Physical Activity: Inactive  ? Days of Exercise per Week: 0 days  ? Minutes of Exercise per Session: 0 min  ?Stress: Not on file  ?Social Connections: Not on file  ?Intimate Partner Violence: Not on file  ? ? ?Family History  ?Problem Relation Age of Onset  ? Diabetes Mother   ? Hypertension Mother   ? Heart attack Mother   ? Hyperlipidemia Mother   ? Heart disease Mother   ?     heart problems; no AMI  ? Kidney disease Mother   ? Hyperlipidemia Sister   ? Hypertension Sister   ? Multiple sclerosis Sister   ? Mental illness Brother   ? Epilepsy Brother   ? Diabetes Sister   ? Sudden death Neg Hx   ? ? ? ?Current Outpatient Medications:  ?  acetaminophen (TYLENOL) 500 MG tablet, Take by mouth., Disp: , Rfl:  ?  albuterol (VENTOLIN HFA) 108 (90 Base) MCG/ACT inhaler, Inhale 2 puffs into the lungs every 4 (four) hours as needed for  wheezing or shortness of breath., Disp: , Rfl:  ?  ascorbic acid (VITAMIN C) 250 MG tablet, Take 1 tablet by mouth daily., Disp: , Rfl:  ?  aspirin EC 81 MG tablet, Take 1 tablet (81 mg total) by mouth daily., Disp: 90 tablet, Rfl: 3 ?  benzonatate (TESSALON) 100 MG capsule, Take by mouth., Disp: , Rfl:  ?  folic acid (FOLVITE) 1 MG tablet, Take 1 mg by mouth daily., Disp: , Rfl:  ?  hydrochlorothiazide (MICROZIDE) 12.5 MG capsule, Take by mouth., Disp: , Rfl:  ?  pantoprazole (PROTONIX) 40 MG tablet, Take by mouth., Disp: , Rfl:  ?  telmisartan-hydrochlorothiazide (MICARDIS HCT) 80-12.5 MG tablet,  Take 1 tablet by mouth daily., Disp: 90 tablet, Rfl: 0 ?  vitamin B-12 (CYANOCOBALAMIN) 1000 MCG tablet, Take 1,000 mcg by mouth daily., Disp: , Rfl:  ?  atorvastatin (LIPITOR) 20 MG tablet, Take 20 mg by mouth at bedtime. (Patient not taking: Reported on 06/09/2021), Disp: , Rfl:  ?  celecoxib (CELEBREX) 200 MG capsule, Take 200 mg by mouth 2 (two) times daily. (Patient not taking: Reported on 02/04/2021), Disp: , Rfl:  ?  cholecalciferol (VITAMIN D3) 25 MCG (1000 UNIT) tablet, Take 1,000 Units by mouth daily. (Patient not taking: Reported on 06/09/2021), Disp: , Rfl:  ?  diltiazem (CARDIZEM CD) 120 MG 24 hr capsule, Take 1 capsule (120 mg total) by mouth daily. (Patient not taking: Reported on 06/09/2021), Disp: 90 capsule, Rfl: 3 ?  diltiazem (TIAZAC) 120 MG 24 hr capsule, Take by mouth. (Patient not taking: Reported on 01/27/2021), Disp: , Rfl:  ?  ferrous sulfate 325 (65 FE) MG EC tablet, Take by mouth. (Patient not taking: Reported on 01/27/2021), Disp: , Rfl:  ?  guaiFENesin-codeine 100-10 MG/5ML syrup, SMARTSIG:5-10 Milliliter(s) By Mouth 4 Times Daily PRN (Patient not taking: Reported on 01/27/2021), Disp: , Rfl:  ?  HYDROcodone-acetaminophen (NORCO/VICODIN) 5-325 MG tablet, Take 1 tablet by mouth every 4 (four) hours as needed. (Patient not taking: Reported on 06/09/2021), Disp: 10 tablet, Rfl: 0 ?  methocarbamol  (ROBAXIN) 500 MG tablet, Take by mouth. (Patient not taking: Reported on 06/09/2021), Disp: , Rfl:  ?  methylPREDNISolone (MEDROL DOSEPAK) 4 MG TBPK tablet, Follow package instructions. (Patient not taking: Reported on

## 2021-06-23 ENCOUNTER — Telehealth: Payer: Self-pay | Admitting: *Deleted

## 2021-06-23 NOTE — Telephone Encounter (Signed)
We will need to wait for 2-3 weeks brfore we check levels and decide. Cannot give it empirically

## 2021-06-23 NOTE — Telephone Encounter (Signed)
Call returned to patient and advised of doctor response, I suggested she call her surgeon to let them know how she is feeling and she agreed to that ?

## 2021-06-23 NOTE — Telephone Encounter (Signed)
Patient called stating she thinks she needs iron because she is dizzy and light headed. She reports that she had her surgery 4/11 (Total Knee per Care Everywhere) Please adivse ?

## 2021-06-25 ENCOUNTER — Ambulatory Visit (INDEPENDENT_AMBULATORY_CARE_PROVIDER_SITE_OTHER): Payer: Managed Care, Other (non HMO) | Admitting: Gastroenterology

## 2021-06-25 ENCOUNTER — Encounter: Payer: Self-pay | Admitting: Gastroenterology

## 2021-06-25 ENCOUNTER — Other Ambulatory Visit: Payer: Self-pay

## 2021-06-25 VITALS — BP 107/74 | HR 75 | Temp 98.2°F | Ht 63.0 in | Wt 205.2 lb

## 2021-06-25 DIAGNOSIS — D509 Iron deficiency anemia, unspecified: Secondary | ICD-10-CM

## 2021-06-25 DIAGNOSIS — D519 Vitamin B12 deficiency anemia, unspecified: Secondary | ICD-10-CM

## 2021-06-25 DIAGNOSIS — Z7409 Other reduced mobility: Secondary | ICD-10-CM | POA: Insufficient documentation

## 2021-06-25 MED ORDER — NA SULFATE-K SULFATE-MG SULF 17.5-3.13-1.6 GM/177ML PO SOLN: 354.0000 mL | Freq: Once | ORAL | 0 refills | Status: AC

## 2021-06-25 NOTE — Progress Notes (Signed)
?  ?Cephas Darby, MD ?[address]  ?[address]  ?[place], [place] [address]  ?Main: [phone]  ?Fax: [phone] ? ? ? ?Gastroenterology Consultation ? ?Referring Provider:     Sindy Guadeloupe, MD ?Primary Care Physician:  Gae Bon, NP ?Primary Gastroenterologist:  Dr. Cephas Darby ?Reason for Consultation: Anemia ?      ? HPI:   ?Audrey Farley is a 61 y.o. female referred by Dr. Gae Bon, NP  for consultation & management of anemia.  Patient is found to have normocytic anemia on 04/04/2021 hemoglobin 9.9, MCV 83.2.  She is found to have B12 deficiency, 135, mildly low iron levels with percent iron saturation ratio.  She was originally evaluated by hematology.  B12 has been replaced.  Her hemoglobin improved to 10.3 as of 05/29/2021.  Patient underwent left total knee replacement, complicated by infection, s/p total knee revision on 06/17/2021.  She is starting physical therapy tomorrow.  She reports intermittent abdominal cramps.  She denies any black stools or rectal bleeding.  She does started taking iron pills.  She is also on DVT prophylaxis with Lovenox managed by her orthopedic surgeon.  No history of chronic kidney disease. ? ? ?NSAIDs: None ? ?Antiplts/Anticoagulants/Anti thrombotics: On Lovenox post knee replacement ? ?GI Procedures: None ?She denies any family history of GI malignancy ? ? ?Current Outpatient Medications:  ?  acetaminophen (TYLENOL) 500 MG tablet, Take by mouth., Disp: , Rfl:  ?  albuterol (VENTOLIN HFA) 108 (90 Base) MCG/ACT inhaler, Inhale 2 puffs into the lungs every 4 (four) hours as needed for wheezing or shortness of breath., Disp: , Rfl:  ?  ascorbic acid (VITAMIN C) 250 MG tablet, Take 1 tablet by mouth daily., Disp: , Rfl:  ?  [START ON 07/03/2021] aspirin 81 MG EC tablet, Take by mouth., Disp: , Rfl:  ?  atorvastatin (LIPITOR) 20 MG tablet, Take 20 mg by mouth at bedtime., Disp: , Rfl:  ?  benzonatate (TESSALON) 100 MG capsule, Take by mouth.,  Disp: , Rfl:  ?  cholecalciferol (VITAMIN D3) 25 MCG (1000 UNIT) tablet, Take 1,000 Units by mouth daily., Disp: , Rfl:  ?  diltiazem (TIAZAC) 120 MG 24 hr capsule, Take by mouth., Disp: , Rfl:  ?  enoxaparin (LOVENOX) 40 MG/0.4ML injection, Inject into the skin daily., Disp: , Rfl:  ?  ferrous sulfate 325 (65 FE) MG EC tablet, Take by mouth., Disp: , Rfl:  ?  folic acid (FOLVITE) 1 MG tablet, Take 1 mg by mouth daily., Disp: , Rfl:  ?  hydrochlorothiazide (MICROZIDE) 12.5 MG capsule, Take by mouth., Disp: , Rfl:  ?  HYDROcodone-acetaminophen (NORCO/VICODIN) 5-325 MG tablet, Take 1 tablet by mouth every 4 (four) hours as needed., Disp: 10 tablet, Rfl: 0 ?  methocarbamol (ROBAXIN) 500 MG tablet, Take by mouth., Disp: , Rfl:  ?  Na Sulfate-K Sulfate-Mg Sulf 17.5-3.13-1.6 GM/177ML SOLN, Take 354 mLs by mouth once for 1 dose., Disp: 354 mL, Rfl: 0 ?  oxyCODONE (OXY IR/ROXICODONE) 5 MG immediate release tablet, Take 5-10 mg by mouth every 4 (four) hours as needed., Disp: , Rfl:  ?  pantoprazole (PROTONIX) 40 MG tablet, Take by mouth., Disp: , Rfl:  ?  senna-docusate (SENOKOT-S) 8.6-50 MG tablet, Take by mouth., Disp: , Rfl:  ?  telmisartan-hydrochlorothiazide (MICARDIS HCT) 80-12.5 MG tablet, Take 1 tablet by mouth daily., Disp: 90 tablet, Rfl: 0 ?  vitamin B-12 (CYANOCOBALAMIN) 1000 MCG tablet, Take 1,000 mcg by mouth daily.,  Disp: , Rfl:  ?  rosuvastatin (CRESTOR) 20 MG tablet, Take 1 tablet (20 mg total) by mouth daily. (Patient not taking: Reported on 01/15/2021), Disp: 90 tablet, Rfl: 3 ? ? ? ?Family History  ?Problem Relation Age of Onset  ? Diabetes Mother   ? Hypertension Mother   ? Heart attack Mother   ? Hyperlipidemia Mother   ? Heart disease Mother   ?     heart problems; no AMI  ? Kidney disease Mother   ? Hyperlipidemia Sister   ? Hypertension Sister   ? Multiple sclerosis Sister   ? Mental illness Brother   ? Epilepsy Brother   ? Diabetes Sister   ? Sudden death Neg Hx   ?  ? ?Social History  ? ?Tobacco Use   ? Smoking status: Never  ? Smokeless tobacco: Never  ?Vaping Use  ? Vaping Use: Never used  ?Substance Use Topics  ? Alcohol use: Yes  ?  Comment: occ vodka  ? Drug use: No  ? ? ?Allergies as of 06/25/2021 - Review Complete 06/25/2021  ?Allergen Reaction Noted  ? Celecoxib Nausea Only 10/23/2020  ? Mirabegron Nausea Only 10/23/2020  ? No known allergies  09/25/2016  ? Latex Itching 01/21/2021  ? ? ?Review of Systems:    ?All systems reviewed and negative except where noted in HPI. ? ? Physical Exam:  ?BP 107/74 (BP Location: Left Arm, Patient Position: Sitting, Cuff Size: Normal)   Pulse 75   Temp 98.2 ?F (36.8 ?C) (Oral)   Ht '5\' 3"'$  (1.6 m)   Wt 205 lb 4 oz (93.1 kg)   BMI 36.36 kg/m?  ?No LMP recorded. Patient has had a hysterectomy. ? ?General:   Alert,  Well-developed, well-nourished, pleasant and cooperative in NAD ?Head:  Normocephalic and atraumatic. ?Eyes:  Sclera clear, no icterus.   Conjunctiva pink. ?Ears:  Normal auditory acuity. ?Nose:  No deformity, discharge, or lesions. ?Mouth:  No deformity or lesions,oropharynx pink & moist. ?Neck:  Supple; no masses or thyromegaly. ?Lungs:  Respirations even and unlabored.  Clear throughout to auscultation.   No wheezes, crackles, or rhonchi. No acute distress. ?Heart:  Regular rate and rhythm; no murmurs, clicks, rubs, or gallops. ?Abdomen:  Normal bowel sounds. Soft, non-tender and non-distended without masses, hepatosplenomegaly or hernias noted.  No guarding or rebound tenderness.   ?Rectal: Not performed ?Msk:  Symmetrical without gross deformities. Good, equal movement & strength bilaterally. ?Pulses:  Normal pulses noted. ?Extremities:  No clubbing or edema.  No cyanosis. ?Neurologic:  Alert and oriented x3;  grossly normal neurologically. ?Skin:  Intact without significant lesions or rashes. No jaundice. ?Psych:  Alert and cooperative. Normal mood and affect. ? ?Imaging Studies: ?No abdominal imaging ? ?Assessment and Plan:  ? ?Dionna Wiedemann is a  61 y.o. female with obesity, hypertension, history of left total knee replacement, recent revision secondary to infection is seen in consultation for new onset of normocytic anemia, found to have B12 deficiency, ?  Iron deficiency ? ?Recommend EGD and colonoscopy for further evaluation.  We will schedule it in 4 weeks from now to allow recovery from the recent revision of her left knee ? ?Follow up as needed ? ? ?Cephas Darby, MD ? ?

## 2021-07-03 ENCOUNTER — Other Ambulatory Visit: Payer: Self-pay | Admitting: *Deleted

## 2021-07-03 DIAGNOSIS — D509 Iron deficiency anemia, unspecified: Secondary | ICD-10-CM

## 2021-07-03 DIAGNOSIS — D638 Anemia in other chronic diseases classified elsewhere: Secondary | ICD-10-CM

## 2021-07-07 ENCOUNTER — Inpatient Hospital Stay: Payer: Managed Care, Other (non HMO) | Attending: Oncology

## 2021-07-07 DIAGNOSIS — D509 Iron deficiency anemia, unspecified: Secondary | ICD-10-CM | POA: Diagnosis present

## 2021-07-07 DIAGNOSIS — E538 Deficiency of other specified B group vitamins: Secondary | ICD-10-CM | POA: Diagnosis not present

## 2021-07-07 DIAGNOSIS — D638 Anemia in other chronic diseases classified elsewhere: Secondary | ICD-10-CM

## 2021-07-07 LAB — FERRITIN: Ferritin: 222 ng/mL (ref 11–307)

## 2021-07-07 LAB — CBC
HCT: 32.3 % — ABNORMAL LOW (ref 36.0–46.0)
Hemoglobin: 10.3 g/dL — ABNORMAL LOW (ref 12.0–15.0)
MCH: 28.2 pg (ref 26.0–34.0)
MCHC: 31.9 g/dL (ref 30.0–36.0)
MCV: 88.5 fL (ref 80.0–100.0)
Platelets: 286 10*3/uL (ref 150–400)
RBC: 3.65 MIL/uL — ABNORMAL LOW (ref 3.87–5.11)
RDW: 15.6 % — ABNORMAL HIGH (ref 11.5–15.5)
WBC: 6.2 10*3/uL (ref 4.0–10.5)
nRBC: 0 % (ref 0.0–0.2)

## 2021-07-07 LAB — IRON AND TIBC
Iron: 43 ug/dL (ref 28–170)
Saturation Ratios: 14 % (ref 10.4–31.8)
TIBC: 305 ug/dL (ref 250–450)
UIBC: 262 ug/dL

## 2021-07-21 ENCOUNTER — Inpatient Hospital Stay: Payer: Managed Care, Other (non HMO)

## 2021-07-21 ENCOUNTER — Other Ambulatory Visit: Payer: Managed Care, Other (non HMO)

## 2021-07-21 DIAGNOSIS — D509 Iron deficiency anemia, unspecified: Secondary | ICD-10-CM | POA: Diagnosis not present

## 2021-07-21 DIAGNOSIS — E538 Deficiency of other specified B group vitamins: Secondary | ICD-10-CM

## 2021-07-21 MED ORDER — CYANOCOBALAMIN 1000 MCG/ML IJ SOLN
1000.0000 ug | INTRAMUSCULAR | Status: DC
  Administered 2021-07-21: 1000 ug via INTRAMUSCULAR
  Filled 2021-07-21: qty 1

## 2021-08-05 ENCOUNTER — Ambulatory Visit (INDEPENDENT_AMBULATORY_CARE_PROVIDER_SITE_OTHER): Payer: Managed Care, Other (non HMO) | Admitting: Vascular Surgery

## 2021-08-15 ENCOUNTER — Encounter (HOSPITAL_COMMUNITY): Payer: Self-pay

## 2021-08-15 ENCOUNTER — Emergency Department (HOSPITAL_COMMUNITY)
Admission: EM | Admit: 2021-08-15 | Discharge: 2021-08-15 | Disposition: A | Discharge status: Home / Self Care | Payer: Managed Care, Other (non HMO) | Attending: Emergency Medicine | Admitting: Emergency Medicine

## 2021-08-15 DIAGNOSIS — Z91040 Latex allergy status: Secondary | ICD-10-CM | POA: Diagnosis not present

## 2021-08-15 DIAGNOSIS — I10 Essential (primary) hypertension: Secondary | ICD-10-CM | POA: Insufficient documentation

## 2021-08-15 DIAGNOSIS — I2510 Atherosclerotic heart disease of native coronary artery without angina pectoris: Secondary | ICD-10-CM | POA: Diagnosis not present

## 2021-08-15 DIAGNOSIS — M7989 Other specified soft tissue disorders: Secondary | ICD-10-CM | POA: Diagnosis present

## 2021-08-15 DIAGNOSIS — Z79899 Other long term (current) drug therapy: Secondary | ICD-10-CM | POA: Diagnosis not present

## 2021-08-15 MED ORDER — ENOXAPARIN SODIUM 150 MG/ML IJ SOSY
1.5000 mg/kg | PREFILLED_SYRINGE | Freq: Once | INTRAMUSCULAR | Status: AC
  Administered 2021-08-15: 138 mg via SUBCUTANEOUS
  Filled 2021-08-15: qty 0.92

## 2021-08-15 NOTE — ED Triage Notes (Signed)
Pt states that she was at PT on Wednesday for her L knee which she had surgery on in April when she noticed R calf pain and swelling. Since then pt states pain and swelling has increased. No blood thinners or hx of DVT per pt. Pt endorses SOB, but states that this is her baseline and no worse than normal.

## 2021-08-15 NOTE — Discharge Instructions (Addendum)
You were seen in the emergency department for leg pain and swelling.  As we discussed, you need an ultrasound of your leg to rule out a blood clot.  I have put in the order for you to have this done tomorrow.  Please come to the [facility] registration desk at 11 AM tell them you are here for an ultrasound.  In the meantime we have given you a preliminary dose of the anticoagulation medicine.  Return to the emergency department immediately for symptoms such as chest pain or difficulty breathing.

## 2021-08-15 NOTE — ED Provider Notes (Signed)
[facility] DEPT Provider Note   CSN: [id number] Arrival date & time: 08/15/21  1833     History  Chief Complaint  Patient presents with   Leg Swelling    Audrey Farley is a 61 y.o. female who presents to the emergency department complaining of right calf pain and swelling.  Patient recently had surgery on her left knee, and was at physical therapy 2 days ago when she noticed the swelling and pain of her right leg.  She states that since then the pain and swelling has increased.  No history of blood clots, is not on chronic anticoagulation.  She states that she has some shortness of breath at baseline, but has had no worsening in the past several days.  No chest pain.  There was no injury to the leg.  The orthopedic surgeon encouraged her to come to the ER to rule out a DVT.  HPI     Home Medications Prior to Admission medications   Medication Sig Start Date End Date Taking? Authorizing Provider  acetaminophen (TYLENOL) 500 MG tablet Take by mouth.    [provider]  albuterol (VENTOLIN HFA) 108 (90 Base) MCG/ACT inhaler Inhale 2 puffs into the lungs every 4 (four) hours as needed for wheezing or shortness of breath.    [provider]  ascorbic acid (VITAMIN C) 250 MG tablet Take 1 tablet by mouth daily. 02/24/21   [provider]  atorvastatin (LIPITOR) 20 MG tablet Take 20 mg by mouth at bedtime. 01/06/21   [provider]  benzonatate (TESSALON) 100 MG capsule Take by mouth. 01/15/21   [provider]  cholecalciferol (VITAMIN D3) 25 MCG (1000 UNIT) tablet Take 1,000 Units by mouth daily.    [provider]  diltiazem (TIAZAC) 120 MG 24 hr capsule Take by mouth. 10/18/19   [provider]  enoxaparin (LOVENOX) 40 MG/0.4ML injection Inject into the skin daily. 06/18/21   [provider]  ferrous sulfate 325 (65 FE) MG EC tablet Take by mouth. 01/24/21   [provider]   folic acid (FOLVITE) 1 MG tablet Take 1 mg by mouth daily.    [provider]  hydrochlorothiazide (MICROZIDE) 12.5 MG capsule Take by mouth. 04/09/21   [provider]  HYDROcodone-acetaminophen (NORCO/VICODIN) 5-325 MG tablet Take 1 tablet by mouth every 4 (four) hours as needed. 01/16/21   Larene Pickett, PA-C  methocarbamol (ROBAXIN) 500 MG tablet Take by mouth. 06/18/21   [provider]  oxyCODONE (OXY IR/ROXICODONE) 5 MG immediate release tablet Take 5-10 mg by mouth every 4 (four) hours as needed. 03/05/21   [provider]  pantoprazole (PROTONIX) 40 MG tablet Take by mouth. 05/24/17   [provider]  rosuvastatin (CRESTOR) 20 MG tablet Take 1 tablet (20 mg total) by mouth daily. Patient not taking: Reported on 01/15/2021 09/06/20 01/15/21  Skeet Latch, MD  senna-docusate (SENOKOT-S) 8.6-50 MG tablet Take by mouth. 06/18/21   [provider]  telmisartan-hydrochlorothiazide (MICARDIS HCT) 80-12.5 MG tablet Take 1 tablet by mouth daily. 07/09/20   Skeet Latch, MD  vitamin B-12 (CYANOCOBALAMIN) 1000 MCG tablet Take 1,000 mcg by mouth daily.    [provider]      Allergies    Celecoxib, Mirabegron, No known allergies, and Latex    Review of Systems   Review of Systems  Respiratory:  Negative for shortness of breath.   Cardiovascular:  Positive for leg swelling. Negative for chest pain.  All  other systems reviewed and are negative.  Physical Exam Updated Vital Signs BP (!) 159/99 (BP Location: Left Arm)   Pulse 86   Temp 98.2 F (36.8 C) (Oral)   Resp 18   Ht '5\' 3"'$  (1.6 m)   Wt 92.1 kg   SpO2 99%   BMI 35.96 kg/m  Physical Exam Vitals and nursing note reviewed.  Constitutional:      Appearance: Normal appearance.  HENT:     Head: Normocephalic and atraumatic.  Eyes:     Conjunctiva/sclera: Conjunctivae normal.  Cardiovascular:     Rate and Rhythm: Normal rate and regular rhythm.     Pulses:           Dorsalis pedis pulses are 2+ on the right side and 2+ on the left side.     Comments: Right calf edema, non-pitting, from level of ankles to posterior knee with associated tenderness to palpation.  Pulmonary:     Effort: Pulmonary effort is normal. No respiratory distress.     Breath sounds: Normal breath sounds.  Abdominal:     General: There is no distension.     Palpations: Abdomen is soft.     Tenderness: There is no abdominal tenderness.  Musculoskeletal:     Comments: Normal range of motion and 5/5 strength in BLE  Skin:    General: Skin is warm and dry.     Comments: No overlying skin changes to BLE  Neurological:     General: No focal deficit present.     Mental Status: She is alert.     Comments: Sensation intact in BLE   ED Results / Procedures / Treatments   Labs (all labs ordered are listed, but only abnormal results are displayed) Labs Reviewed - No data to display  EKG None  Radiology No results found.  Procedures Procedures    Medications Ordered in ED Medications  enoxaparin (LOVENOX) injection 138 mg (has no administration in time range)    ED Course/ Medical Decision Making/ A&P                           Medical Decision Making Risk Prescription drug management.  This patient is a 61 y.o. female who presents to the ED for concern of right calf swelling after recent surgery.   Past Medical History / Co-morbidities / Social History: HTN, CAD  Physical Exam: Physical exam performed. The pertinent findings include: Right leg nonpitting edema.  Good distal pulses.  Normal strength and sensation in BLE.  No overlying skin changes.   Disposition: After consideration of the diagnostic results and the patients response to treatment, I feel that patient's requiring ultrasound to rule out DVT.  Discussed thoroughly with the patient that our facility does not have the appropriate staff to perform this test at this time.  As I have high suspicion for DVT  due to her recent surgery, as well as the pattern of her symptoms, will treat prophylactically with a one-time dose of Lovenox and have her return tomorrow for an ultrasound.  I placed the order for her to have this done at [facility] tomorrow and patient is agreeable to this.  I discussed thoroughly that if she has any worsening of symptoms, including chest pain or shortness of breath, she is to return to the ER immediately.  She is agreeable to the plan.    Final Clinical Impression(s) / ED Diagnoses Final diagnoses:  Right leg swelling  Rx / DC Orders ED Discharge Orders          Ordered    LE VENOUS        08/15/21 1953           Portions of this report may have been transcribed using voice recognition software. Every effort was made to ensure accuracy; however, inadvertent computerized transcription errors may be present.    Estill Cotta 08/15/21 2016    Sherwood Gambler, MD 08/18/21 [phone]

## 2021-08-16 ENCOUNTER — Ambulatory Visit (HOSPITAL_COMMUNITY)
Admission: RE | Admit: 2021-08-16 | Discharge: 2021-08-16 | Disposition: A | Discharge status: Home / Self Care | Payer: Managed Care, Other (non HMO) | Source: Ambulatory Visit | Attending: Emergency Medicine | Admitting: Emergency Medicine

## 2021-08-16 DIAGNOSIS — R600 Localized edema: Secondary | ICD-10-CM | POA: Diagnosis not present

## 2021-08-16 DIAGNOSIS — M79604 Pain in right leg: Secondary | ICD-10-CM

## 2021-08-16 DIAGNOSIS — M7989 Other specified soft tissue disorders: Secondary | ICD-10-CM | POA: Diagnosis not present

## 2021-09-01 ENCOUNTER — Inpatient Hospital Stay (HOSPITAL_BASED_OUTPATIENT_CLINIC_OR_DEPARTMENT_OTHER): Payer: Managed Care, Other (non HMO) | Admitting: Oncology

## 2021-09-01 ENCOUNTER — Inpatient Hospital Stay: Payer: Managed Care, Other (non HMO) | Attending: Oncology

## 2021-09-01 ENCOUNTER — Inpatient Hospital Stay: Payer: Managed Care, Other (non HMO)

## 2021-09-01 VITALS — BP 125/82 | HR 77 | Temp 97.0°F | Resp 16 | Ht 63.0 in | Wt 204.3 lb

## 2021-09-01 DIAGNOSIS — Z96652 Presence of left artificial knee joint: Secondary | ICD-10-CM | POA: Insufficient documentation

## 2021-09-01 DIAGNOSIS — D509 Iron deficiency anemia, unspecified: Secondary | ICD-10-CM | POA: Diagnosis present

## 2021-09-01 DIAGNOSIS — Z79899 Other long term (current) drug therapy: Secondary | ICD-10-CM | POA: Diagnosis not present

## 2021-09-01 DIAGNOSIS — E538 Deficiency of other specified B group vitamins: Secondary | ICD-10-CM

## 2021-09-01 LAB — CBC WITH DIFFERENTIAL/PLATELET
Abs Immature Granulocytes: 0.01 10*3/uL (ref 0.00–0.07)
Basophils Absolute: 0 10*3/uL (ref 0.0–0.1)
Basophils Relative: 1 %
Eosinophils Absolute: 0.1 10*3/uL (ref 0.0–0.5)
Eosinophils Relative: 2 %
HCT: 33.3 % — ABNORMAL LOW (ref 36.0–46.0)
Hemoglobin: 11 g/dL — ABNORMAL LOW (ref 12.0–15.0)
Immature Granulocytes: 0 %
Lymphocytes Relative: 30 %
Lymphs Abs: 1.9 10*3/uL (ref 0.7–4.0)
MCH: 28.4 pg (ref 26.0–34.0)
MCHC: 33 g/dL (ref 30.0–36.0)
MCV: 85.8 fL (ref 80.0–100.0)
Monocytes Absolute: 0.6 10*3/uL (ref 0.1–1.0)
Monocytes Relative: 9 %
Neutro Abs: 3.8 10*3/uL (ref 1.7–7.7)
Neutrophils Relative %: 58 %
Platelets: 291 10*3/uL (ref 150–400)
RBC: 3.88 MIL/uL (ref 3.87–5.11)
RDW: 12.3 % (ref 11.5–15.5)
WBC: 6.4 10*3/uL (ref 4.0–10.5)
nRBC: 0 % (ref 0.0–0.2)

## 2021-09-01 LAB — VITAMIN B12: Vitamin B-12: 394 pg/mL (ref 180–914)

## 2021-09-01 LAB — RETICULOCYTES
Immature Retic Fract: 4.5 % (ref 2.3–15.9)
RBC.: 3.83 MIL/uL — ABNORMAL LOW (ref 3.87–5.11)
Retic Count, Absolute: 31.4 10*3/uL (ref 19.0–186.0)
Retic Ct Pct: 0.8 % (ref 0.4–3.1)

## 2021-09-01 LAB — IRON AND TIBC
Iron: 27 ug/dL — ABNORMAL LOW (ref 28–170)
Saturation Ratios: 10 % — ABNORMAL LOW (ref 10.4–31.8)
TIBC: 286 ug/dL (ref 250–450)
UIBC: 259 ug/dL

## 2021-09-01 LAB — LACTATE DEHYDROGENASE: LDH: 152 U/L (ref 98–192)

## 2021-09-01 LAB — FERRITIN: Ferritin: 179 ng/mL (ref 11–307)

## 2021-09-01 NOTE — Progress Notes (Signed)
Pt reports having to wait 3 months after having knee surgery before being able to get colonoscopy done. Pt reports "queasiness" in stomach and loose green stools  in the morning that are more formed throughout the day.

## 2021-09-02 ENCOUNTER — Other Ambulatory Visit: Payer: Self-pay

## 2021-09-02 DIAGNOSIS — D509 Iron deficiency anemia, unspecified: Secondary | ICD-10-CM

## 2021-09-02 LAB — KAPPA/LAMBDA LIGHT CHAINS
Kappa free light chain: 30 mg/L — ABNORMAL HIGH (ref 3.3–19.4)
Kappa, lambda light chain ratio: 1.46 (ref 0.26–1.65)
Lambda free light chains: 20.6 mg/L (ref 5.7–26.3)

## 2021-09-02 LAB — HAPTOGLOBIN: Haptoglobin: 227 mg/dL (ref 33–346)

## 2021-09-04 LAB — MULTIPLE MYELOMA PANEL, SERUM
Albumin SerPl Elph-Mcnc: 3.4 g/dL (ref 2.9–4.4)
Albumin/Glob SerPl: 1.2 (ref 0.7–1.7)
Alpha 1: 0.3 g/dL (ref 0.0–0.4)
Alpha2 Glob SerPl Elph-Mcnc: 0.7 g/dL (ref 0.4–1.0)
B-Globulin SerPl Elph-Mcnc: 0.9 g/dL (ref 0.7–1.3)
Gamma Glob SerPl Elph-Mcnc: 1.1 g/dL (ref 0.4–1.8)
Globulin, Total: 3 g/dL (ref 2.2–3.9)
IgA: 66 mg/dL — ABNORMAL LOW (ref 87–352)
IgG (Immunoglobin G), Serum: 1082 mg/dL (ref 586–1602)
IgM (Immunoglobulin M), Srm: 107 mg/dL (ref 26–217)
M Protein SerPl Elph-Mcnc: 0.2 g/dL — ABNORMAL HIGH
Total Protein ELP: 6.4 g/dL (ref 6.0–8.5)

## 2021-09-05 ENCOUNTER — Inpatient Hospital Stay: Payer: Managed Care, Other (non HMO)

## 2021-09-07 ENCOUNTER — Encounter: Payer: Self-pay | Admitting: Oncology

## 2021-09-07 NOTE — Progress Notes (Signed)
Hematology/Oncology Consult note [facility]  [phone] [phone]  Patient Care Team: Gae Bon, NP as PCP - General (Nurse Practitioner) Skeet Latch, MD as PCP - Cardiology (Cardiology) Sindy Guadeloupe, MD as Consulting Physician (Hematology and Oncology)   Name of the patient: Audrey Farley  [id number]  [birth year]   Date of visit: 09/07/21  Diagnosis- normocytic anemia likely multifactorial secondary to chronic disease as well as component of iron and B12 deficiency  Chief complaint/ Reason for visit- routine f/u of anemia  Heme/Onc history: patient is a 61 year old African-American female with a past medical history significant for hypertension hyperlipidemia GERD among other medical problems.  She has been referred to [place] for anemia.Most recent CBC from November 2022 showed a hemoglobin of 11 that was normocytic.  Prior to that her hemoglobin was 10 in July 2022.  B12 levels low at 197 and folate levels which were previously abnormal in July normalized to 7.9.  Serum iron low at 20 and iron saturation 8%.  Patient denies any family history of cancer.  Denies any blood loss in her stool or urine.  Denies any dark melanotic stool.  Denies any consistent use of NSAIDs.  States that she had a colonoscopy in August or September 2022 diagnosis pulmonary not seen colonoscopy reports in the system.  She has not tried oral iron or oral B12.  Reports mild fatigue but denies other complaints at this time.  Interval history-patient still has some ongoing pain and discomfort after her knee surgery.  Colonoscopy has been postponed for some time following a left knee replacement  ECOG PS- 1 Pain scale- 2   Review of systems- Review of Systems  Constitutional:  Negative for chills, fever, malaise/fatigue and weight loss.  HENT:  Negative for congestion, ear discharge and nosebleeds.   Eyes:  Negative for blurred vision.   Respiratory:  Negative for cough, hemoptysis, sputum production, shortness of breath and wheezing.   Cardiovascular:  Negative for chest pain, palpitations, orthopnea and claudication.  Gastrointestinal:  Negative for abdominal pain, blood in stool, constipation, diarrhea, heartburn, melena, nausea and vomiting.  Genitourinary:  Negative for dysuria, flank pain, frequency, hematuria and urgency.  Musculoskeletal:  Positive for joint pain (Left knee pain). Negative for back pain and myalgias.  Skin:  Negative for rash.  Neurological:  Negative for dizziness, tingling, focal weakness, seizures, weakness and headaches.  Endo/Heme/Allergies:  Does not bruise/bleed easily.  Psychiatric/Behavioral:  Negative for depression and suicidal ideas. The patient does not have insomnia.       Allergies  Allergen Reactions   Celecoxib Nausea Only   Mirabegron Nausea Only   No Known Allergies    Latex Itching     Past Medical History:  Diagnosis Date   Allergy    Aneurysm of ascending aorta ([place]) 02/25/2016   3.9cm by echo 02/2016  -aorta enlarged   Arthritis    CAD in native artery 11/19/2020   Chest pain 01/24/2016   Dyspnea on exertion    Hypertension    Urge incontinence      Past Surgical History:  Procedure Laterality Date   ABDOMINAL HYSTERECTOMY     still has cervix; DUB; ovaries intact.   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   TOTAL KNEE ARTHROPLASTY Left 09/28/2016   Procedure: LEFT TOTAL KNEE ARTHROPLASTY;  Surgeon: Vickey Huger, MD;  Location: [facility];  Service: Orthopedics;  Laterality: Left;    Social History  Socioeconomic History   Marital status: Single    Spouse name: Not on file   Number of children: Not on file   Years of education: Not on file   Highest education level: Not on file  Occupational History   Not on file  Tobacco Use   Smoking status: Never   Smokeless tobacco: Never  Vaping Use   Vaping Use: Never used   Substance and Sexual Activity   Alcohol use: Yes    Comment: occ vodka   Drug use: No   Sexual activity: Yes    Birth control/protection: Surgical, Post-menopausal  Other Topics Concern   Not on file  Social History Narrative   Marital status: single; not dating since 2014      Children: 2 daughters (16, 30); no grandchildren      Lives: with 2 daughters       Employment: tobacco company; Stage manager x 16 years; loves job!  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        Tobacco: none      Alcohol; weekends/socially      Drugs: none      Exercise:  Two days per week; cardio      Seatbelt: 100%; no texting      Sexual activity: not sexually active since 2014; dates males; no STDs; total partners = 7.   Social Determinants of Health   Financial Resource Strain: Low Risk  (11/19/2020)   Overall Financial Resource Strain (CARDIA)    Difficulty of Paying Living Expenses: Not hard at all  Food Insecurity: No Food Insecurity (11/19/2020)   Hunger Vital Sign    Worried About Running Out of Food in the Last Year: Never true    Ran Out of Food in the Last Year: Never true  Transportation Needs: No Transportation Needs (11/19/2020)   PRAPARE - Hydrologist (Medical): No    Lack of Transportation (Non-Medical): No  Physical Activity: Inactive (11/19/2020)   Exercise Vital Sign    Days of Exercise per Week: 0 days    Minutes of Exercise per Session: 0 min  Stress: Not on file  Social Connections: Not on file  Intimate Partner Violence: Not on file    Family History  Problem Relation Age of Onset   Diabetes Mother    Hypertension Mother    Heart attack Mother    Hyperlipidemia Mother    Heart disease Mother        heart problems; no AMI   Kidney disease Mother    Hyperlipidemia Sister    Hypertension Sister    Multiple sclerosis Sister    Mental illness Brother    Epilepsy Brother    Diabetes Sister    Sudden death Neg Hx       Current Outpatient Medications:    acetaminophen (TYLENOL) 500 MG tablet, Take by mouth., Disp: , Rfl:    albuterol (VENTOLIN HFA) 108 (90 Base) MCG/ACT inhaler, Inhale 2 puffs into the lungs every 4 (four) hours as needed for wheezing or shortness of breath., Disp: , Rfl:    ascorbic acid (VITAMIN C) 250 MG tablet, Take 1 tablet by mouth daily., Disp: , Rfl:    atorvastatin (LIPITOR) 20 MG tablet, Take 20 mg by mouth at bedtime., Disp: , Rfl:    benzonatate (TESSALON) 100 MG capsule, Take by mouth., Disp: , Rfl:    cholecalciferol (VITAMIN D3) 25 MCG (1000 UNIT) tablet, Take 1,000 Units by  mouth daily., Disp: , Rfl:    diltiazem (TIAZAC) 120 MG 24 hr capsule, Take by mouth., Disp: , Rfl:    enoxaparin (LOVENOX) 40 MG/0.4ML injection, Inject into the skin daily., Disp: , Rfl:    ferrous sulfate 325 (65 FE) MG EC tablet, Take by mouth., Disp: , Rfl:    folic acid (FOLVITE) 1 MG tablet, Take 1 mg by mouth daily., Disp: , Rfl:    hydrochlorothiazide (MICROZIDE) 12.5 MG capsule, Take by mouth., Disp: , Rfl:    HYDROcodone-acetaminophen (NORCO/VICODIN) 5-325 MG tablet, Take 1 tablet by mouth every 4 (four) hours as needed., Disp: 10 tablet, Rfl: 0   methocarbamol (ROBAXIN) 500 MG tablet, Take by mouth., Disp: , Rfl:    oxyCODONE (OXY IR/ROXICODONE) 5 MG immediate release tablet, Take 5-10 mg by mouth every 4 (four) hours as needed., Disp: , Rfl:    pantoprazole (PROTONIX) 40 MG tablet, Take by mouth., Disp: , Rfl:    senna-docusate (SENOKOT-S) 8.6-50 MG tablet, Take by mouth., Disp: , Rfl:    telmisartan-hydrochlorothiazide (MICARDIS HCT) 80-12.5 MG tablet, Take 1 tablet by mouth daily., Disp: 90 tablet, Rfl: 0   vitamin B-12 (CYANOCOBALAMIN) 1000 MCG tablet, Take 1,000 mcg by mouth daily., Disp: , Rfl:    rosuvastatin (CRESTOR) 20 MG tablet, Take 1 tablet (20 mg total) by mouth daily. (Patient not taking: Reported on 01/15/2021), Disp: 90 tablet, Rfl: 3  Physical exam:  Vitals:   09/01/21  1341  BP: 125/82  Pulse: 77  Resp: 16  Temp: (!) 97 F (36.1 C)  TempSrc: Tympanic  SpO2: 100%  Weight: 204 lb 4.8 oz (92.7 kg)  Height: '5\' 3"'$  (1.6 m)   Physical Exam Constitutional:      General: She is not in acute distress. Cardiovascular:     Rate and Rhythm: Normal rate and regular rhythm.     Heart sounds: Normal heart sounds.  Pulmonary:     Effort: Pulmonary effort is normal.     Breath sounds: Normal breath sounds.  Abdominal:     General: Bowel sounds are normal.     Palpations: Abdomen is soft.  Skin:    General: Skin is warm and dry.  Neurological:     Mental Status: She is alert and oriented to person, place, and time.         Latest Ref Rng & Units 11/19/2020   10:00 AM  CMP  Glucose 65 - 99 mg/dL 94   BUN 6 - 24 mg/dL 16   Creatinine 0.57 - 1.00 mg/dL 0.99   Sodium 134 - 144 mmol/L 141   Potassium 3.5 - 5.2 mmol/L 4.0   Chloride 96 - 106 mmol/L 103   CO2 20 - 29 mmol/L 23   Calcium 8.7 - 10.2 mg/dL 9.5   Total Protein 6.0 - 8.5 g/dL 6.8   Total Bilirubin 0.0 - 1.2 mg/dL 0.4   Alkaline Phos 44 - 121 IU/L 101   AST 0 - 40 IU/L 11   ALT 0 - 32 IU/L 17       Latest Ref Rng & Units 09/01/2021    1:29 PM  CBC  WBC 4.0 - 10.5 K/uL 6.4   Hemoglobin 12.0 - 15.0 g/dL 11.0   Hematocrit 36.0 - 46.0 % 33.3   Platelets 150 - 400 K/uL 291     No images are attached to the encounter.  LE VENOUS  Result Date: 08/17/2021  Lower Venous DVT Study Patient Name:  GENA LASKI  Date  of Exam:   08/16/2021 Medical Rec #: [id number]          Accession #:    [id number] Date of Birth: 02-04-1961          Patient Gender: F Patient Age:   61 years Exam Location:  [facility] Procedure:      VAS [place] LOWER EXTREMITY VENOUS (DVT) Referring Phys: Audrie Gallus ROEMHILDT --------------------------------------------------------------------------------  Indications: Pain, and Swelling.  Limitations: Poor ultrasound/tissue interface. Comparison Study: Previous venous reflux  exam on 10/30/20 showed no evidence of                   DVT in areas examined. Baker's cyst present in RLE at the time                   of exam. Performing Technologist: Rogelia Rohrer RVT, RDMS  Examination Guidelines: A complete evaluation includes B-mode imaging, spectral Doppler, color Doppler, and power Doppler as needed of all accessible portions of each vessel. Bilateral testing is considered an integral part of a complete examination. Limited examinations for reoccurring indications may be performed as noted. The reflux portion of the exam is performed with the patient in reverse Trendelenburg.  +---------+---------------+---------+-----------+----------+--------------+ RIGHT    CompressibilityPhasicitySpontaneityPropertiesThrombus Aging +---------+---------------+---------+-----------+----------+--------------+ CFV      Full           Yes      Yes                                 +---------+---------------+---------+-----------+----------+--------------+ SFJ      Full                                                        +---------+---------------+---------+-----------+----------+--------------+ FV Prox  Full           Yes      Yes                                 +---------+---------------+---------+-----------+----------+--------------+ FV Mid   Full           Yes      Yes                                 +---------+---------------+---------+-----------+----------+--------------+ FV DistalFull           Yes      Yes                                 +---------+---------------+---------+-----------+----------+--------------+ PFV      Full                                                        +---------+---------------+---------+-----------+----------+--------------+ POP      Full           Yes      Yes                                 +---------+---------------+---------+-----------+----------+--------------+  PTV      Full                                                         +---------+---------------+---------+-----------+----------+--------------+ PERO     Full                                                        +---------+---------------+---------+-----------+----------+--------------+   +----+---------------+---------+-----------+----------+--------------+ LEFTCompressibilityPhasicitySpontaneityPropertiesThrombus Aging +----+---------------+---------+-----------+----------+--------------+ CFV Full           Yes      Yes                                 +----+---------------+---------+-----------+----------+--------------+    Summary: RIGHT: - There is no evidence of deep vein thrombosis in the lower extremity.  - Complex cystic appearing structure with internal echoes extending from popliteal fossa into mid posterior calf. Subcutaneous edema noted in area of calf.  LEFT: - No evidence of common femoral vein obstruction.  *See table(s) above for measurements and observations. Electronically signed by Servando Snare MD on 08/17/2021 at 10:08:08 AM.    Final      Assessment and plan- Patient is a 61 y.o. female here for routine follow-up of normocytic anemia  Hemoglobin is improved to 11 from 9.95 months prior.  Ferritin levels are normal at 179 but iron saturation is still low at 10%.  Myeloma panel showed a small amount of 0.2 g of IgG M protein but I do not think that is contributing to her anemia.  Free light chain ratio is normal.  Reticulocyte count haptoglobin is normal and therefore not indicative of hemolysis.  B12 levels are normal and LDH is normal.  I will schedule her for 3 more Venofer's at this time and see her back in 4 months for labs and 8 months to see me   Visit Diagnosis 1. Iron deficiency anemia, unspecified iron deficiency anemia type      Dr. Randa Evens, MD, MPH [facility] [phone] 09/07/2021 10:22 AM

## 2021-09-12 ENCOUNTER — Inpatient Hospital Stay: Payer: Managed Care, Other (non HMO) | Attending: Oncology

## 2021-09-12 MED FILL — Iron Sucrose Inj 20 MG/ML (Fe Equiv): INTRAVENOUS | Qty: 10 | Status: AC

## 2021-09-19 ENCOUNTER — Inpatient Hospital Stay: Payer: Managed Care, Other (non HMO)

## 2021-09-19 MED FILL — Iron Sucrose Inj 20 MG/ML (Fe Equiv): INTRAVENOUS | Qty: 10 | Status: AC

## 2021-10-08 ENCOUNTER — Other Ambulatory Visit: Payer: Self-pay | Admitting: Obstetrics and Gynecology

## 2021-10-08 DIAGNOSIS — R2232 Localized swelling, mass and lump, left upper limb: Secondary | ICD-10-CM

## 2021-10-09 ENCOUNTER — Inpatient Hospital Stay: Payer: Managed Care, Other (non HMO) | Attending: Oncology

## 2021-10-09 VITALS — BP 139/78 | HR 65 | Temp 98.2°F | Resp 18

## 2021-10-09 DIAGNOSIS — D509 Iron deficiency anemia, unspecified: Secondary | ICD-10-CM | POA: Insufficient documentation

## 2021-10-09 DIAGNOSIS — Z79899 Other long term (current) drug therapy: Secondary | ICD-10-CM | POA: Insufficient documentation

## 2021-10-09 DIAGNOSIS — E538 Deficiency of other specified B group vitamins: Secondary | ICD-10-CM

## 2021-10-09 MED ORDER — SODIUM CHLORIDE 0.9 % IV SOLN
200.0000 mg | INTRAVENOUS | Status: DC
  Administered 2021-10-09: 200 mg via INTRAVENOUS
  Filled 2021-10-09: qty 200

## 2021-10-09 MED ORDER — SODIUM CHLORIDE 0.9 % IV SOLN
Freq: Once | INTRAVENOUS | Status: AC
  Filled 2021-10-09: qty 250

## 2021-10-09 MED ORDER — CYANOCOBALAMIN 1000 MCG/ML IJ SOLN
1000.0000 ug | INTRAMUSCULAR | Status: DC
  Administered 2021-10-09: 1000 ug via INTRAMUSCULAR
  Filled 2021-10-09: qty 1

## 2021-10-09 NOTE — Patient Instructions (Signed)
Start taking oral B12 1000 mcg tablet by mouth daily. We will check B12 levels at your next lab visit.

## 2021-10-14 ENCOUNTER — Ambulatory Visit
Admission: RE | Admit: 2021-10-14 | Discharge: 2021-10-14 | Disposition: A | Discharge status: Home / Self Care | Payer: Managed Care, Other (non HMO) | Source: Ambulatory Visit | Attending: Obstetrics and Gynecology | Admitting: Obstetrics and Gynecology

## 2021-10-14 ENCOUNTER — Other Ambulatory Visit: Payer: Self-pay | Admitting: Obstetrics and Gynecology

## 2021-10-14 DIAGNOSIS — R599 Enlarged lymph nodes, unspecified: Secondary | ICD-10-CM

## 2021-10-14 DIAGNOSIS — R2232 Localized swelling, mass and lump, left upper limb: Secondary | ICD-10-CM

## 2021-10-16 ENCOUNTER — Other Ambulatory Visit: Payer: Self-pay | Admitting: Medical Oncology

## 2021-10-16 ENCOUNTER — Inpatient Hospital Stay: Payer: Managed Care, Other (non HMO)

## 2021-10-16 VITALS — BP 147/81 | HR 67 | Temp 97.7°F | Resp 20

## 2021-10-16 DIAGNOSIS — D509 Iron deficiency anemia, unspecified: Secondary | ICD-10-CM | POA: Diagnosis not present

## 2021-10-16 DIAGNOSIS — E538 Deficiency of other specified B group vitamins: Secondary | ICD-10-CM

## 2021-10-16 MED ORDER — SODIUM CHLORIDE 0.9 % IV SOLN
Freq: Once | INTRAVENOUS | Status: AC
  Filled 2021-10-16: qty 250

## 2021-10-16 MED ORDER — SODIUM CHLORIDE 0.9 % IV SOLN
200.0000 mg | Freq: Once | INTRAVENOUS | Status: AC
  Administered 2021-10-16: 200 mg via INTRAVENOUS
  Filled 2021-10-16: qty 10

## 2021-10-17 ENCOUNTER — Encounter: Payer: Self-pay | Admitting: Oncology

## 2021-10-17 ENCOUNTER — Ambulatory Visit
Admission: RE | Admit: 2021-10-17 | Discharge: 2021-10-17 | Disposition: A | Discharge status: Home / Self Care | Payer: Managed Care, Other (non HMO) | Source: Ambulatory Visit | Attending: Obstetrics and Gynecology | Admitting: Obstetrics and Gynecology

## 2021-10-17 DIAGNOSIS — R599 Enlarged lymph nodes, unspecified: Secondary | ICD-10-CM

## 2021-10-23 ENCOUNTER — Inpatient Hospital Stay: Payer: Managed Care, Other (non HMO)

## 2021-10-23 VITALS — BP 119/72 | HR 72 | Temp 98.6°F | Resp 18

## 2021-10-23 DIAGNOSIS — D509 Iron deficiency anemia, unspecified: Secondary | ICD-10-CM | POA: Diagnosis not present

## 2021-10-23 DIAGNOSIS — E538 Deficiency of other specified B group vitamins: Secondary | ICD-10-CM

## 2021-10-23 MED ORDER — SODIUM CHLORIDE 0.9 % IV SOLN
200.0000 mg | Freq: Once | INTRAVENOUS | Status: AC
  Administered 2021-10-23: 200 mg via INTRAVENOUS
  Filled 2021-10-23: qty 200

## 2021-10-23 MED ORDER — SODIUM CHLORIDE 0.9 % IV SOLN
Freq: Once | INTRAVENOUS | Status: AC
  Filled 2021-10-23: qty 250

## 2021-10-28 ENCOUNTER — Encounter: Payer: Self-pay | Admitting: Oncology

## 2021-10-29 ENCOUNTER — Encounter: Payer: Self-pay | Admitting: Oncology

## 2021-10-31 ENCOUNTER — Encounter: Payer: Self-pay | Admitting: Gastroenterology

## 2021-11-03 ENCOUNTER — Ambulatory Visit
Admission: RE | Admit: 2021-11-03 | Discharge: 2021-11-03 | Disposition: A | Discharge status: Home / Self Care | Payer: Self-pay | Attending: Gastroenterology | Admitting: Gastroenterology

## 2021-11-03 ENCOUNTER — Ambulatory Visit: Payer: Self-pay | Admitting: Anesthesiology

## 2021-11-03 ENCOUNTER — Encounter: Payer: Self-pay | Admitting: Gastroenterology

## 2021-11-03 ENCOUNTER — Encounter: Payer: Self-pay | Admitting: Oncology

## 2021-11-03 ENCOUNTER — Encounter
Admission: RE | Disposition: A | Discharge status: Home / Self Care | Payer: Self-pay | Source: Home / Self Care | Attending: Gastroenterology

## 2021-11-03 ENCOUNTER — Other Ambulatory Visit: Payer: Self-pay

## 2021-11-03 DIAGNOSIS — K3189 Other diseases of stomach and duodenum: Secondary | ICD-10-CM | POA: Insufficient documentation

## 2021-11-03 DIAGNOSIS — K219 Gastro-esophageal reflux disease without esophagitis: Secondary | ICD-10-CM | POA: Insufficient documentation

## 2021-11-03 DIAGNOSIS — I2510 Atherosclerotic heart disease of native coronary artery without angina pectoris: Secondary | ICD-10-CM | POA: Insufficient documentation

## 2021-11-03 DIAGNOSIS — K259 Gastric ulcer, unspecified as acute or chronic, without hemorrhage or perforation: Secondary | ICD-10-CM

## 2021-11-03 DIAGNOSIS — I10 Essential (primary) hypertension: Secondary | ICD-10-CM | POA: Insufficient documentation

## 2021-11-03 DIAGNOSIS — D509 Iron deficiency anemia, unspecified: Secondary | ICD-10-CM

## 2021-11-03 HISTORY — PX: COLONOSCOPY WITH PROPOFOL: SHX5780

## 2021-11-03 HISTORY — DX: Hyperlipidemia, unspecified: E78.5

## 2021-11-03 HISTORY — PX: ESOPHAGOGASTRODUODENOSCOPY (EGD) WITH PROPOFOL: SHX5813

## 2021-11-03 SURGERY — COLONOSCOPY WITH PROPOFOL
Anesthesia: General

## 2021-11-03 MED ORDER — DEXMEDETOMIDINE HCL IN NACL 200 MCG/50ML IV SOLN
INTRAVENOUS | Status: DC | PRN
  Administered 2021-11-03: 12 ug via INTRAVENOUS

## 2021-11-03 MED ORDER — LIDOCAINE HCL (CARDIAC) PF 100 MG/5ML IV SOSY
PREFILLED_SYRINGE | INTRAVENOUS | Status: DC | PRN
  Administered 2021-11-03: 50 mg via INTRAVENOUS

## 2021-11-03 MED ORDER — PROPOFOL 1000 MG/100ML IV EMUL
INTRAVENOUS | Status: AC
  Filled 2021-11-03: qty 100

## 2021-11-03 MED ORDER — LIDOCAINE HCL (PF) 2 % IJ SOLN
INTRAMUSCULAR | Status: AC
  Filled 2021-11-03: qty 5

## 2021-11-03 MED ORDER — PANTOPRAZOLE SODIUM 40 MG PO TBEC: 40.0000 mg | DELAYED_RELEASE_TABLET | Freq: Every day | ORAL | 0 refills | Status: DC

## 2021-11-03 MED ORDER — PROPOFOL 500 MG/50ML IV EMUL
INTRAVENOUS | Status: DC | PRN
  Administered 2021-11-03: 120 ug/kg/min via INTRAVENOUS

## 2021-11-03 MED ORDER — DEXMEDETOMIDINE HCL IN NACL 80 MCG/20ML IV SOLN
INTRAVENOUS | Status: AC
  Filled 2021-11-03: qty 20

## 2021-11-03 MED ORDER — PROPOFOL 10 MG/ML IV BOLUS
INTRAVENOUS | Status: DC | PRN
  Administered 2021-11-03: 40 mg via INTRAVENOUS
  Administered 2021-11-03: 20 mg via INTRAVENOUS
  Administered 2021-11-03: 80 mg via INTRAVENOUS
  Administered 2021-11-03 (×2): 20 mg via INTRAVENOUS

## 2021-11-03 MED ORDER — PROPOFOL 10 MG/ML IV BOLUS
INTRAVENOUS | Status: AC
  Filled 2021-11-03: qty 20

## 2021-11-03 MED ORDER — SODIUM CHLORIDE 0.9 % IV SOLN: INTRAVENOUS | Status: DC

## 2021-11-03 NOTE — Op Note (Signed)
[facility] Gastroenterology Patient Name: Audrey Farley Procedure Date: 11/03/2021 7:24 AM MRN: [id number] Account #: [id number] Date of Birth: September 04, 1960 Admit Type: Outpatient Age: 61 Room: [facility] ENDO ROOM 4 Gender: Female Note Status: Finalized Instrument Name: Altamese Cabal Endoscope [id number] Procedure:             Upper GI endoscopy Indications:           Unexplained iron deficiency anemia Providers:             Lin Landsman MD, MD Medicines:             General Anesthesia Complications:         No immediate complications. Estimated blood loss: None. Procedure:             Pre-Anesthesia Assessment:                        - Prior to the procedure, a History and Physical was                         performed, and patient medications and allergies were                         reviewed. The patient is competent. The risks and                         benefits of the procedure and the sedation options and                         risks were discussed with the patient. All questions                         were answered and informed consent was obtained.                         Patient identification and proposed procedure were                         verified by the physician, the nurse, the                         anesthesiologist, the anesthetist and the technician                         in the pre-procedure area in the procedure room in the                         endoscopy suite. Mental Status Examination: alert and                         oriented. Airway Examination: normal oropharyngeal                         airway and neck mobility. Respiratory Examination:                         clear to auscultation. CV Examination: normal.                         Prophylactic  Antibiotics: The patient does not require                         prophylactic antibiotics. Prior Anticoagulants: The                         patient has taken no previous anticoagulant or                          antiplatelet agents. ASA Grade Assessment: III - A                         patient with severe systemic disease. After reviewing                         the risks and benefits, the patient was deemed in                         satisfactory condition to undergo the procedure. The                         anesthesia plan was to use general anesthesia.                         Immediately prior to administration of medications,                         the patient was re-assessed for adequacy to receive                         sedatives. The heart rate, respiratory rate, oxygen                         saturations, blood pressure, adequacy of pulmonary                         ventilation, and response to care were monitored                         throughout the procedure. The physical status of the                         patient was re-assessed after the procedure.                        After obtaining informed consent, the endoscope was                         passed under direct vision. Throughout the procedure,                         the patient's blood pressure, pulse, and oxygen                         saturations were monitored continuously. The Endoscope                         was introduced through the mouth, and advanced to the  second part of duodenum. The upper GI endoscopy was                         accomplished without difficulty. The patient tolerated                         the procedure well. Findings:      The duodenal bulb and second portion of the duodenum were normal.      One non-bleeding healing superficial gastric ulcer with a clean ulcer       base (Forrest Class III) was found in the gastric antrum. The lesion was       8 mm in largest dimension.      The gastric body, incisura and prepyloric region of the stomach were       normal. Biopsies were taken with a cold forceps for Helicobacter pylori       testing.      The  cardia and gastric fundus were normal on retroflexion.      The gastroesophageal junction and examined esophagus were normal. Impression:            - Normal duodenal bulb and second portion of the                         duodenum.                        - Non-bleeding gastric ulcer with a clean ulcer base                         (Forrest Class III).                        - Normal gastric body, incisura and prepyloric region                         of the stomach. Biopsied.                        - Normal gastroesophageal junction and esophagus. Recommendation:        - Await pathology results.                        - Proceed with colonoscopy as scheduled                        See colonoscopy report Procedure Code(s):     --- Professional ---                        [phone], Esophagogastroduodenoscopy, flexible,                         transoral; with biopsy, single or multiple Diagnosis Code(s):     --- Professional ---                        K25.9, Gastric ulcer, unspecified as acute or chronic,                         without hemorrhage or perforation  D50.9, Iron deficiency anemia, unspecified CPT copyright 2019 American Medical Association. All rights reserved. The codes documented in this report are preliminary and upon coder review may  be revised to meet current compliance requirements. Dr. Ulyess Mort Lin Landsman MD, MD 11/03/2021 7:56:48 AM This report has been signed electronically. Number of Addenda: 0 Note Initiated On: 11/03/2021 7:24 AM Estimated Blood Loss:  Estimated blood loss: none.      [facility]

## 2021-11-03 NOTE — Anesthesia Preprocedure Evaluation (Signed)
Anesthesia Evaluation  Patient identified by MRN, date of birth, ID band Patient awake    Reviewed: Allergy & Precautions, NPO status , Patient's Chart, lab work & pertinent test results  History of Anesthesia Complications Negative for: history of anesthetic complications  Airway Mallampati: III  TM Distance: >3 FB Neck ROM: full    Dental  (+) Chipped   Pulmonary neg pulmonary ROS, neg shortness of breath,    Pulmonary exam normal        Cardiovascular hypertension, (-) angina+ CAD  Normal cardiovascular exam     Neuro/Psych negative neurological ROS  negative psych ROS   GI/Hepatic Neg liver ROS, GERD  Controlled,  Endo/Other  negative endocrine ROS  Renal/GU negative Renal ROS  negative genitourinary   Musculoskeletal   Abdominal   Peds  Hematology negative hematology ROS (+)   Anesthesia Other Findings Past Medical History: No date: Allergy 02/25/2016: Aneurysm of ascending aorta ([facility])     Comment:  3.9cm by echo 02/2016  -aorta enlarged No date: Arthritis 11/19/2020: CAD in native artery 01/24/2016: Chest pain No date: Dyspnea on exertion No date: Hypertension No date: Urge incontinence  Past Surgical History: No date: ABDOMINAL HYSTERECTOMY     Comment:  still has cervix; DUB; ovaries intact. 2012: KNEE ARTHROSCOPY; Left 09/28/2016: TOTAL KNEE ARTHROPLASTY; Left 09/28/2016: TOTAL KNEE ARTHROPLASTY; Left     Comment:  Procedure: LEFT TOTAL KNEE ARTHROPLASTY;  Surgeon:               Vickey Huger, MD;  Location: [place] [facility];  Service:               Orthopedics;  Laterality: Left;     Reproductive/Obstetrics negative OB ROS                             Anesthesia Physical Anesthesia Plan  ASA: 3  Anesthesia Plan: General   Post-op Pain Management:    Induction: Intravenous  PONV Risk Score and Plan: Propofol infusion and TIVA  Airway Management Planned: Natural Airway  and Nasal Cannula  Additional Equipment:   Intra-op Plan:   Post-operative Plan:   Informed Consent: I have reviewed the patients History and Physical, chart, labs and discussed the procedure including the risks, benefits and alternatives for the proposed anesthesia with the patient or authorized representative who has indicated his/her understanding and acceptance.     Dental Advisory Given  Plan Discussed with: Anesthesiologist, CRNA and Surgeon  Anesthesia Plan Comments: (Patient consented for risks of anesthesia including but not limited to:  - adverse reactions to medications - risk of airway placement if required - damage to eyes, teeth, lips or other oral mucosa - nerve damage due to positioning  - sore throat or hoarseness - Damage to heart, brain, nerves, lungs, other parts of body or loss of life  Patient voiced understanding.)        Anesthesia Quick Evaluation

## 2021-11-03 NOTE — H&P (Signed)
Cephas Darby, MD [address]  [place], [place] [address]  Main: [phone]  Fax: [phone] Pager: [phone]  Primary Care Physician:  Gae Bon, NP Primary Gastroenterologist:  Dr. Cephas Darby  Pre-Procedure History & Physical: HPI:  Audrey Farley is a 61 y.o. female is here for an endoscopy and colonoscopy.   Past Medical History:  Diagnosis Date   Allergy    Aneurysm of ascending aorta ([place]) 02/25/2016   3.9cm by echo 02/2016  -aorta enlarged   Arthritis    CAD in native artery 11/19/2020   Chest pain 01/24/2016   Dyspnea on exertion    HLD (hyperlipidemia)    Hypertension    Urge incontinence     Past Surgical History:  Procedure Laterality Date   ABDOMINAL HYSTERECTOMY     still has cervix; DUB; ovaries intact.   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   TOTAL KNEE ARTHROPLASTY Left 09/28/2016   Procedure: LEFT TOTAL KNEE ARTHROPLASTY;  Surgeon: Vickey Huger, MD;  Location: [facility];  Service: Orthopedics;  Laterality: Left;    Prior to Admission medications   Medication Sig Start Date End Date Taking? Authorizing Provider  albuterol (VENTOLIN HFA) 108 (90 Base) MCG/ACT inhaler Inhale 2 puffs into the lungs every 4 (four) hours as needed for wheezing or shortness of breath.   Yes [provider]  diltiazem (TIAZAC) 120 MG 24 hr capsule Take by mouth. 10/18/19  Yes [provider]  telmisartan-hydrochlorothiazide (MICARDIS HCT) 80-12.5 MG tablet Take 1 tablet by mouth daily. 07/09/20  Yes Skeet Latch, MD  acetaminophen (TYLENOL) 500 MG tablet Take by mouth.    [provider]  ascorbic acid (VITAMIN C) 250 MG tablet Take 1 tablet by mouth daily. 02/24/21   [provider]  atorvastatin (LIPITOR) 20 MG tablet Take 20 mg by mouth at bedtime. 01/06/21   [provider]  benzonatate (TESSALON) 100 MG capsule Take by mouth. 01/15/21   [provider]   cholecalciferol (VITAMIN D3) 25 MCG (1000 UNIT) tablet Take 1,000 Units by mouth daily.    [provider]  enoxaparin (LOVENOX) 40 MG/0.4ML injection Inject into the skin daily. 06/18/21   [provider]  ferrous sulfate 325 (65 FE) MG EC tablet Take by mouth. 01/24/21   [provider]  folic acid (FOLVITE) 1 MG tablet Take 1 mg by mouth daily.    [provider]  hydrochlorothiazide (MICROZIDE) 12.5 MG capsule Take by mouth. 04/09/21   [provider]  HYDROcodone-acetaminophen (NORCO/VICODIN) 5-325 MG tablet Take 1 tablet by mouth every 4 (four) hours as needed. 01/16/21   Larene Pickett, PA-C  methocarbamol (ROBAXIN) 500 MG tablet Take by mouth. 06/18/21   [provider]  oxyCODONE (OXY IR/ROXICODONE) 5 MG immediate release tablet Take 5-10 mg by mouth every 4 (four) hours as needed. 03/05/21   [provider]  pantoprazole (PROTONIX) 40 MG tablet Take by mouth. 05/24/17   [provider]  rosuvastatin (CRESTOR) 20 MG tablet Take 1 tablet (20 mg total) by mouth daily. Patient not taking: Reported on 01/15/2021 09/06/20 01/15/21  Skeet Latch, MD  senna-docusate (SENOKOT-S) 8.6-50 MG tablet Take by mouth. 06/18/21   [provider]  vitamin B-12 (CYANOCOBALAMIN) 1000 MCG tablet Take 1,000 mcg by mouth daily.    [provider]    Allergies as of 06/25/2021 - Review Complete 06/25/2021  Allergen Reaction Noted   Celecoxib Nausea Only 10/23/2020  Mirabegron Nausea Only 10/23/2020   No known allergies  09/25/2016   Latex Itching 01/21/2021    Family History  Problem Relation Age of Onset   Diabetes Mother    Hypertension Mother    Heart attack Mother    Hyperlipidemia Mother    Heart disease Mother        heart problems; no AMI   Kidney disease Mother    Hyperlipidemia Sister    Hypertension Sister    Multiple sclerosis Sister    Mental illness Brother    Epilepsy Brother    Diabetes  Sister    Sudden death Neg Hx     Social History   Socioeconomic History   Marital status: Single    Spouse name: Not on file   Number of children: Not on file   Years of education: Not on file   Highest education level: Not on file  Occupational History   Not on file  Tobacco Use   Smoking status: Never   Smokeless tobacco: Never  Vaping Use   Vaping Use: Never used  Substance and Sexual Activity   Alcohol use: Yes    Comment: occ vodka.NONE LAST 24HRS   Drug use: No   Sexual activity: Yes    Birth control/protection: Surgical, Post-menopausal  Other Topics Concern   Not on file  Social History Narrative   Marital status: single; not dating since 2014      Children: 2 daughters (16, 40); no grandchildren      Lives: with 2 daughters       Employment: tobacco company; Stage manager x 16 years; loves job!  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        Tobacco: none      Alcohol; weekends/socially      Drugs: none      Exercise:  Two days per week; cardio      Seatbelt: 100%; no texting      Sexual activity: not sexually active since 2014; dates males; no STDs; total partners = 7.   Social Determinants of Health   Financial Resource Strain: Low Risk  (11/19/2020)   Overall Financial Resource Strain (CARDIA)    Difficulty of Paying Living Expenses: Not hard at all  Food Insecurity: No Food Insecurity (11/19/2020)   Hunger Vital Sign    Worried About Running Out of Food in the Last Year: Never true    Ran Out of Food in the Last Year: Never true  Transportation Needs: No Transportation Needs (11/19/2020)   PRAPARE - Hydrologist (Medical): No    Lack of Transportation (Non-Medical): No  Physical Activity: Inactive (11/19/2020)   Exercise Vital Sign    Days of Exercise per Week: 0 days    Minutes of Exercise per Session: 0 min  Stress: Not on file  Social Connections: Not on file  Intimate Partner Violence: Not on  file    Review of Systems: See HPI, otherwise negative ROS  Physical Exam: BP 115/78   Pulse 70   Temp (!) 96.4 F (35.8 C) (Temporal)   Resp 20   Ht '5\' 3"'$  (1.6 m)   Wt 95.3 kg   SpO2 100%   BMI 37.20 kg/m  General:   Alert,  pleasant and cooperative in NAD Head:  Normocephalic and atraumatic. Neck:  Supple; no masses or thyromegaly. Lungs:  Clear throughout to auscultation.    Heart:  Regular rate and rhythm. Abdomen:  Soft, nontender and nondistended. Normal bowel sounds, without guarding, and without rebound.   Neurologic:  Alert and  oriented x4;  grossly normal neurologically.  Impression/Plan: Alycia Cooperwood is here for an endoscopy and colonoscopy to be performed for IDA  Risks, benefits, limitations, and alternatives regarding  endoscopy and colonoscopy have been reviewed with the patient.  Questions have been answered.  All parties agreeable.   Sherri Sear, MD  11/03/2021, 7:32 AM

## 2021-11-03 NOTE — Op Note (Signed)
[facility] Gastroenterology Patient Name: Audrey Farley Procedure Date: 11/03/2021 7:23 AM MRN: [id number] Account #: [id number] Date of Birth: January 06, 1961 Admit Type: Outpatient Age: 61 Room: [facility] ENDO ROOM 4 Gender: Female Note Status: Finalized Instrument Name: Colonoscope [id number] Procedure:             Colonoscopy Indications:           This is the patient's first colonoscopy, Unexplained                         iron deficiency anemia Providers:             Lin Landsman MD, MD Medicines:             General Anesthesia Complications:         No immediate complications. Estimated blood loss: None. Procedure:             Pre-Anesthesia Assessment:                        - Prior to the procedure, a History and Physical was                         performed, and patient medications and allergies were                         reviewed. The patient is competent. The risks and                         benefits of the procedure and the sedation options and                         risks were discussed with the patient. All questions                         were answered and informed consent was obtained.                         Patient identification and proposed procedure were                         verified by the physician, the nurse, the                         anesthesiologist, the anesthetist and the technician                         in the pre-procedure area in the procedure room in the                         endoscopy suite. Mental Status Examination: alert and                         oriented. Airway Examination: normal oropharyngeal                         airway and neck mobility. Respiratory Examination:                         clear to auscultation. CV  Examination: normal.                         Prophylactic Antibiotics: The patient does not require                         prophylactic antibiotics. Prior Anticoagulants: The                          patient has taken no previous anticoagulant or                         antiplatelet agents. ASA Grade Assessment: III - A                         patient with severe systemic disease. After reviewing                         the risks and benefits, the patient was deemed in                         satisfactory condition to undergo the procedure. The                         anesthesia plan was to use general anesthesia.                         Immediately prior to administration of medications,                         the patient was re-assessed for adequacy to receive                         sedatives. The heart rate, respiratory rate, oxygen                         saturations, blood pressure, adequacy of pulmonary                         ventilation, and response to care were monitored                         throughout the procedure. The physical status of the                         patient was re-assessed after the procedure.                        After obtaining informed consent, the colonoscope was                         passed under direct vision. Throughout the procedure,                         the patient's blood pressure, pulse, and oxygen                         saturations were monitored continuously. The  Colonoscope was introduced through the anus and                         advanced to the the terminal ileum, with                         identification of the appendiceal orifice and IC                         valve. The colonoscopy was performed without                         difficulty. The patient tolerated the procedure well.                         The quality of the bowel preparation was evaluated                         using the BBPS [facility] Bowel Preparation Scale) with                         scores of: Right Colon = 3, Transverse Colon = 3 and                         Left Colon = 3 (entire mucosa seen well with no                          residual staining, small fragments of stool or opaque                         liquid). The total BBPS score equals 9. Findings:      The perianal and digital rectal examinations were normal. Pertinent       negatives include normal sphincter tone and no palpable rectal lesions.      The terminal ileum appeared normal.      The entire examined colon appeared normal.      The retroflexed view of the distal rectum and anal verge was normal and       showed no anal or rectal abnormalities. Impression:            - The examined portion of the ileum was normal.                        - The entire examined colon is normal.                        - The distal rectum and anal verge are normal on                         retroflexion view.                        - No specimens collected. Recommendation:        - Discharge patient to home (with escort).                        - Resume previous diet today.                        -  Continue present medications.                        - Repeat colonoscopy in 10 years for screening                         purposes. Procedure Code(s):     --- Professional ---                        [phone], Colonoscopy, flexible; diagnostic, including                         collection of specimen(s) by brushing or washing, when                         performed (separate procedure) Diagnosis Code(s):     --- Professional ---                        D50.9, Iron deficiency anemia, unspecified CPT copyright 2019 American Medical Association. All rights reserved. The codes documented in this report are preliminary and upon coder review may  be revised to meet current compliance requirements. Dr. Ulyess Mort Lin Landsman MD, MD 11/03/2021 8:13:48 AM This report has been signed electronically. Number of Addenda: 0 Note Initiated On: 11/03/2021 7:23 AM Scope Withdrawal Time: 0 hours 8 minutes 5 seconds  Total Procedure Duration: 0 hours 12 minutes 48 seconds   Estimated Blood Loss:  Estimated blood loss: none.      [facility]

## 2021-11-03 NOTE — Anesthesia Postprocedure Evaluation (Signed)
Anesthesia Post Note  Patient: Latroya Ng  Procedure(s) Performed: COLONOSCOPY WITH PROPOFOL ESOPHAGOGASTRODUODENOSCOPY (EGD) WITH PROPOFOL  Patient location during evaluation: Endoscopy Anesthesia Type: General Level of consciousness: awake and alert Pain management: pain level controlled Vital Signs Assessment: post-procedure vital signs reviewed and stable Respiratory status: spontaneous breathing, nonlabored ventilation, respiratory function stable and patient connected to nasal cannula oxygen Cardiovascular status: blood pressure returned to baseline and stable Postop Assessment: no apparent nausea or vomiting Anesthetic complications: no   No notable events documented.   Last Vitals:  Vitals:   11/03/21 0816 11/03/21 0836  BP: 117/70 132/71  Pulse:    Resp: 18   Temp: (!) 35.6 C   SpO2: 98%     Last Pain:  Vitals:   11/03/21 0836  TempSrc:   PainSc: 0-No pain                 Precious Haws Talayla Doyel

## 2021-11-03 NOTE — Transfer of Care (Signed)
Immediate Anesthesia Transfer of Care Note  Patient: Edit Ricciardelli  Procedure(s) Performed: COLONOSCOPY WITH PROPOFOL ESOPHAGOGASTRODUODENOSCOPY (EGD) WITH PROPOFOL  Patient Location: PACU and Endoscopy Unit  Anesthesia Type:General  Level of Consciousness: drowsy and patient cooperative  Airway & Oxygen Therapy: Patient Spontanous Breathing  Post-op Assessment: Report given to RN and Post -op Vital signs reviewed and stable  Post vital signs: Reviewed and stable  Last Vitals:  Vitals Value Taken Time  BP 117/70 11/03/21 0818  Temp 35.6 C 11/03/21 0816  Pulse 61 11/03/21 0821  Resp 19 11/03/21 0821  SpO2 100 % 11/03/21 0821  Vitals shown include unvalidated device data.  Last Pain:  Vitals:   11/03/21 0816  TempSrc: Temporal  PainSc: 0-No pain         Complications: No notable events documented.

## 2021-11-04 ENCOUNTER — Encounter: Payer: Self-pay | Admitting: Gastroenterology

## 2021-11-06 LAB — SURGICAL PATHOLOGY

## 2021-11-11 ENCOUNTER — Telehealth: Payer: Self-pay

## 2021-11-11 NOTE — Telephone Encounter (Signed)
Please inform the patient that her ferritin levels are normal.  Her serum iron is one-point below the normal limit.  We can recheck her labs since it has been more than 2 months before doing further testing  RV

## 2021-11-11 NOTE — Telephone Encounter (Signed)
Patient verbalized understanding of results. She states her PCP will draw her labs in 2 months

## 2021-11-11 NOTE — Telephone Encounter (Signed)
-----   Message from Lin Landsman, MD sent at 11/07/2021  9:59 AM EDT ----- Please inform patient that the pathology results from upper endoscopy came back normal  RV

## 2021-11-11 NOTE — Telephone Encounter (Signed)
Patient verbalized understanding of results. Patient wants to know why her Iron levels are still low. Patient states she wants to know why she has not had a MRI or a Pet scan she states everything is coming back normal and she wants to know why her levels are low. She states her PCP  has referred her to a neurologist but she has not heard about a referral

## 2021-11-17 ENCOUNTER — Encounter: Payer: Self-pay | Admitting: Oncology

## 2021-12-17 ENCOUNTER — Telehealth: Payer: Self-pay | Admitting: *Deleted

## 2021-12-17 ENCOUNTER — Other Ambulatory Visit: Payer: Self-pay | Admitting: *Deleted

## 2021-12-17 DIAGNOSIS — E538 Deficiency of other specified B group vitamins: Secondary | ICD-10-CM

## 2021-12-17 DIAGNOSIS — D638 Anemia in other chronic diseases classified elsewhere: Secondary | ICD-10-CM

## 2021-12-17 DIAGNOSIS — D509 Iron deficiency anemia, unspecified: Secondary | ICD-10-CM

## 2021-12-17 NOTE — Telephone Encounter (Signed)
Patient called asking if she can have her lab appointment moved up. She has had weakness, dizziness and shortness of breath for the past 2 weeks. Please Arnoldo Hooker

## 2021-12-17 NOTE — Telephone Encounter (Signed)
I called the pt back and she states that she has cough, nose running. She said she has been n contact with people that has coughs and runny nose. She checked her covid test on sun and it was neg. I asked the pt to please do another test before coming in tom. And if it is positive-please not to come in and call us and we will see what we can do. Otherwise if it is negative then please come in. Lab orders are in and pt ok with this plan and she does have covid test at her house and knows how to use it

## 2021-12-18 ENCOUNTER — Inpatient Hospital Stay: Payer: Medicaid Other | Attending: Oncology

## 2021-12-18 DIAGNOSIS — D638 Anemia in other chronic diseases classified elsewhere: Secondary | ICD-10-CM

## 2021-12-18 DIAGNOSIS — E538 Deficiency of other specified B group vitamins: Secondary | ICD-10-CM | POA: Insufficient documentation

## 2021-12-18 DIAGNOSIS — D509 Iron deficiency anemia, unspecified: Secondary | ICD-10-CM | POA: Insufficient documentation

## 2021-12-18 LAB — IRON AND TIBC
Iron: 36 ug/dL (ref 28–170)
Saturation Ratios: 11 % (ref 10.4–31.8)
TIBC: 318 ug/dL (ref 250–450)
UIBC: 282 ug/dL

## 2021-12-18 LAB — CBC WITH DIFFERENTIAL/PLATELET
Abs Immature Granulocytes: 0.01 10*3/uL (ref 0.00–0.07)
Basophils Absolute: 0.1 10*3/uL (ref 0.0–0.1)
Basophils Relative: 1 %
Eosinophils Absolute: 0.2 10*3/uL (ref 0.0–0.5)
Eosinophils Relative: 3 %
HCT: 35.4 % — ABNORMAL LOW (ref 36.0–46.0)
Hemoglobin: 12 g/dL (ref 12.0–15.0)
Immature Granulocytes: 0 %
Lymphocytes Relative: 26 %
Lymphs Abs: 1.7 10*3/uL (ref 0.7–4.0)
MCH: 28.2 pg (ref 26.0–34.0)
MCHC: 33.9 g/dL (ref 30.0–36.0)
MCV: 83.3 fL (ref 80.0–100.0)
Monocytes Absolute: 0.5 10*3/uL (ref 0.1–1.0)
Monocytes Relative: 7 %
Neutro Abs: 4.1 10*3/uL (ref 1.7–7.7)
Neutrophils Relative %: 63 %
Platelets: 278 10*3/uL (ref 150–400)
RBC: 4.25 MIL/uL (ref 3.87–5.11)
RDW: 13.7 % (ref 11.5–15.5)
WBC: 6.5 10*3/uL (ref 4.0–10.5)
nRBC: 0 % (ref 0.0–0.2)

## 2021-12-18 LAB — VITAMIN B12: Vitamin B-12: 286 pg/mL (ref 180–914)

## 2021-12-18 LAB — FERRITIN: Ferritin: 210 ng/mL (ref 11–307)

## 2021-12-19 ENCOUNTER — Ambulatory Visit (HOSPITAL_BASED_OUTPATIENT_CLINIC_OR_DEPARTMENT_OTHER): Payer: Medicaid Other | Admitting: Cardiovascular Disease

## 2021-12-19 ENCOUNTER — Encounter (HOSPITAL_BASED_OUTPATIENT_CLINIC_OR_DEPARTMENT_OTHER): Payer: Self-pay | Admitting: Family

## 2021-12-19 ENCOUNTER — Ambulatory Visit (INDEPENDENT_AMBULATORY_CARE_PROVIDER_SITE_OTHER): Payer: Self-pay | Admitting: Family

## 2021-12-19 VITALS — BP 132/84 | HR 63 | Ht 63.0 in | Wt 216.6 lb

## 2021-12-19 DIAGNOSIS — R0609 Other forms of dyspnea: Secondary | ICD-10-CM

## 2021-12-19 DIAGNOSIS — I2510 Atherosclerotic heart disease of native coronary artery without angina pectoris: Secondary | ICD-10-CM

## 2021-12-19 DIAGNOSIS — I10 Essential (primary) hypertension: Secondary | ICD-10-CM

## 2021-12-19 DIAGNOSIS — E785 Hyperlipidemia, unspecified: Secondary | ICD-10-CM

## 2021-12-19 DIAGNOSIS — K219 Gastro-esophageal reflux disease without esophagitis: Secondary | ICD-10-CM

## 2021-12-19 MED ORDER — PANTOPRAZOLE SODIUM 40 MG PO TBEC: 40.0000 mg | DELAYED_RELEASE_TABLET | Freq: Every day | ORAL | 3 refills | Status: AC

## 2021-12-19 MED ORDER — ROSUVASTATIN CALCIUM 20 MG PO TABS: 20.0000 mg | ORAL_TABLET | Freq: Every day | ORAL | 3 refills | Status: DC

## 2021-12-19 MED ORDER — FUROSEMIDE 20 MG PO TABS: 20.0000 mg | ORAL_TABLET | Freq: Every day | ORAL | 2 refills | Status: DC | PRN

## 2021-12-19 NOTE — Patient Instructions (Addendum)
Medication Instructions:   Your physician has recommended you make the following change in your medication:   START Furosemide (Lasix) one '20mg'$  tablet daily for 3 days then as needed for swelling *if taking more than twice per week call and let us know   *If you need a refill on your cardiac medications before your next appointment, please call your pharmacy*   Lab Work: Your physician recommends that you return for lab work today CMP, lipid panel  If you have labs (blood work) drawn today and your tests are completely normal, you will receive your results only by: [place] (if you have MyChart) OR A paper copy in the mail If you have any lab test that is abnormal or we need to change your treatment, we will call you to review the results.  Testing/Procedures:  Your physician has requested that you have an echocardiogram. Echocardiography is a painless test that uses sound waves to create images of your heart. It provides your doctor with information about the size and shape of your heart and how well your heart's chambers and valves are working. This procedure takes approximately one hour. There are no restrictions for this procedure. Please do NOT wear cologne, perfume, aftershave, or lotions (deodorant is allowed). Please arrive 15 minutes prior to your appointment time.   Follow-Up: At [facility], you and your health needs are our priority.  As part of our continuing mission to provide you with exceptional heart care, we have created designated Provider Care Teams.  These Care Teams include your primary Cardiologist (physician) and Advanced Practice Providers (APPs -  Physician Assistants and Nurse Practitioners) who all work together to provide you with the care you need, when you need it.  We recommend signing up for the patient portal called "MyChart".  Sign up information is provided on this After Visit Summary.  MyChart is used to connect with patients for  Virtual Visits (Telemedicine).  Patients are able to view lab/test results, encounter notes, upcoming appointments, etc.  Non-urgent messages can be sent to your provider as well.   To learn more about what you can do with MyChart, go to [email or web address].    Your next appointment:   3-4 month(s)  The format for your next appointment:   In Person  Provider:   Skeet Latch, MD or Laurann Montana, NP    Other Instructions  To prevent or reduce lower extremity swelling: Eat a low salt diet. Salt makes the body hold onto extra fluid which causes swelling. Sit with legs elevated. For example, in the recliner or on an [place].  Wear knee-high compression stockings during the daytime. Ones labeled 15-20 mmHg provide good compression.  Heart Healthy Diet Recommendations: A low-salt diet is recommended. Meats should be grilled, baked, or boiled. Avoid fried foods. Focus on lean protein sources like fish or chicken with vegetables and fruits. The American Heart Association is a Microbiologist!  American Heart Association Diet and Lifeystyle Recommendations   Exercise recommendations: The American Heart Association recommends 150 minutes of moderate intensity exercise weekly. Try 30 minutes of moderate intensity exercise 4-5 times per week. This could include walking, jogging, or swimming.  Important Information About Sugar

## 2021-12-19 NOTE — Progress Notes (Signed)
Office Visit    Patient Name: Audrey Farley Date of Encounter: 12/19/2021  PCP:  Gae Bon, NP   Index  Cardiologist:  Skeet Latch, MD  Advanced Practice Provider:  Loel Dubonnet, NP Electrophysiologist:  None      Chief Complaint    Audrey Farley is a 61 y.o. female presents today for CAD, HTN.   Past Medical History    Past Medical History:  Diagnosis Date   Allergy    Aneurysm of ascending aorta ([place]) 02/25/2016   3.9cm by echo 02/2016  -aorta enlarged   Arthritis    CAD in native artery 11/19/2020   Chest pain 01/24/2016   Dyspnea on exertion    HLD (hyperlipidemia)    Hypertension    Urge incontinence    Past Surgical History:  Procedure Laterality Date   ABDOMINAL HYSTERECTOMY     still has cervix; DUB; ovaries intact.   COLONOSCOPY WITH PROPOFOL N/A 11/03/2021   Procedure: COLONOSCOPY WITH PROPOFOL;  Surgeon: Lin Landsman, MD;  Location: [facility] ENDOSCOPY;  Service: Gastroenterology;  Laterality: N/A;   ESOPHAGOGASTRODUODENOSCOPY (EGD) WITH PROPOFOL N/A 11/03/2021   Procedure: ESOPHAGOGASTRODUODENOSCOPY (EGD) WITH PROPOFOL;  Surgeon: Lin Landsman, MD;  Location: [facility] ENDOSCOPY;  Service: Gastroenterology;  Laterality: N/A;   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   TOTAL KNEE ARTHROPLASTY Left 09/28/2016   Procedure: LEFT TOTAL KNEE ARTHROPLASTY;  Surgeon: Vickey Huger, MD;  Location: [place];  Service: Orthopedics;  Laterality: Left;    Allergies  Allergies  Allergen Reactions   Celecoxib Nausea Only   Mirabegron Nausea Only   No Known Allergies    Latex Itching    History of Present Illness    Audrey Farley is a 61 y.o. female with a hx of HTN, GERD, HLD, nonobstructive CAD last seen 11/2020 by Dr. Oval Linsey.  Exercise Myoview in 2017 with EF 53% with no ischemia.  She achieved 8 METS on Bruce protocol.  Echo 02/2016 EF 55 to 60%, mild dilation  ascending aorta 3.9 cm.  Stress test 06/2019 EF 70% with no ischemia.  She was started on a diltiazem due to elevated blood pressure.  Repeat echo 06/2019 LVEF 60 to 65%, mild LVH, diastolic function indeterminate, ascending aorta normal in size.  Seen 08/2020 for acute visit for chest pain.  Coronary CTA with minimal nonobstructive disease.  Calcium score placing her in the 96 percentile.  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  Last in clinic 11/2020 by Dr. Oval Linsey.  Blood pressure at home was well controlled.  She noted heartburn but no anginal symptoms.  She noted bilateral lower extremity edema worse on the left at site of prior knee replacement.  Protonix was started for acid reflux.  Her exertional dyspnea was presumed due to deconditioning. She was pending repeat knee surgery which was performed 06/2021.  Presents today for follow-up. Reports no shortness of breath at rest and worsening dyspnea on exertion over the last 2-3 months. NOtes she has been inactive due to recent knee surgeries.. Reports occasional sharp sensation in her mid chest when laying down associated with palpitations. No exertional chest pain. No  orthopnea, PND. Does not edema in her hands and knees. BP at home routinely 140s.   EKGs/Labs/Other Studies Reviewed:   The following studies were reviewed today:   ABI 10/15/2020: Summary:  Right: Resting right ankle-brachial index is within normal range. No  evidence of significant right lower extremity arterial disease. The right  toe-brachial index is normal.   Left: Resting left ankle-brachial index is within normal range. No  evidence of significant left lower extremity arterial disease. The left  toe-brachial index is normal.    CT Angio Chest and Aorta 08/23/2020: IMPRESSION: 1. No evidence of thoracic aortic aneurysm or dissection. 2. Aortic atherosclerosis, in addition to left main and 2 vessel coronary artery disease. Please  note that although the presence of coronary artery calcium documents the presence of coronary artery disease, the severity of this disease and any potential stenosis cannot be assessed on this non-gated CT examination. Assessment for potential risk factor modification, dietary therapy or pharmacologic therapy may be warranted, if clinically indicated. 3. Mild cardiomegaly.   Aortic Atherosclerosis (ICD10-I70.0).   Coronary CT  08/23/2020: IMPRESSION: 1. Coronary calcium score of 206. This was 96th percentile for age, sex, and race matched control.   2. Normal coronary origin with right dominance.   3. CAD-RADS 1. Minimal non-obstructive CAD (1-24%). Consider non-atherosclerotic causes of chest pain. Consider preventive therapy and risk factor modification.   4. There is evidence of distal left main dilation into the proximal LAD, maximal diameter 10.4 mm. Diameter meets criteria for aneurysm.   Lexiscan Myoview 06/23/2019: Electrically nondiagnostic for ischemia Normal perfusion. No ischemia or scar LVEF 70% Low risk study.   Echo 06/23/2019:  1. Left ventricular ejection fraction, by estimation, is 60 to 65%. The  left ventricle has normal function. The left ventricle has no regional  wall motion abnormalities. There is mild concentric left ventricular  hypertrophy. Left ventricular diastolic  parameters are indeterminate.   2. Right ventricular systolic function is normal. The right ventricular  size is normal. There is normal pulmonary artery systolic pressure.   3. The mitral valve is normal in structure. Trivial mitral valve  regurgitation. No evidence of mitral stenosis.   4. The aortic valve is normal in structure. Aortic valve regurgitation is  not visualized. No aortic stenosis is present.   5. The inferior vena cava is normal in size with greater than 50%  respiratory variability, suggesting right atrial pressure of 3 mmHg.    Echo 02/21/16: Study Conclusions -  Left ventricle: The cavity size was normal. There was mild   concentric hypertrophy. Systolic function was normal. The   estimated ejection fraction was in the range of 55% to 60%. Wall   motion was normal; there were no regional wall motion   abnormalities. - Aortic valve: Trileaflet; normal thickness, mildly calcified   leaflets. - Aorta: Ascending aortic diameter: 39 mm (S). - Aortic root: The aortic root was normal in size. - Ascending aorta: The ascending aorta was mildly dilated. - Mitral valve: There was trivial regurgitation. - Tricuspid valve: There was trivial regurgitation.   Exercise Myoview 02/14/16: The left ventricular ejection fraction is mildly decreased (45-54%). Nuclear stress EF: 53%. Blood pressure demonstrated a hypertensive response to exercise. Baseline EKG showed NSR with diffuse ST/T wave abnormality in the inferolateral leads. During stress there was 61m J point depression from baseline with horizontal ST segment depression in the inferolateral leads. EKG nondiagnostic due to resting EKG changes. The study is normal. This is a low risk study.    EKG:  EKG is ordered today.  The ekg ordered today demonstrates NSR 63 bpm with no acute ST/T wave changes.   Recent Labs: 12/18/2021: Hemoglobin 12.0; Platelets 278  Recent Lipid Panel    Component Value Date/Time  CHOL 184 11/19/2020 1000   TRIG 51 11/19/2020 1000   HDL 95 11/19/2020 1000   CHOLHDL 1.9 11/19/2020 1000   CHOLHDL 2.1 12/20/2015 1123   VLDL 9 12/20/2015 1123   LDLCALC 79 11/19/2020 1000   Home Medications   Current Meds  Medication Sig   acetaminophen (TYLENOL) 500 MG tablet Take by mouth.   albuterol (VENTOLIN HFA) 108 (90 Base) MCG/ACT inhaler Inhale 2 puffs into the lungs every 4 (four) hours as needed for wheezing or shortness of breath.   ascorbic acid (VITAMIN C) 250 MG tablet Take 1 tablet by mouth daily.   aspirin EC 81 MG tablet Take 81 mg by mouth daily. Swallow whole.    atorvastatin (LIPITOR) 20 MG tablet Take 20 mg by mouth at bedtime.   benzonatate (TESSALON) 100 MG capsule Take by mouth.   cholecalciferol (VITAMIN D3) 25 MCG (1000 UNIT) tablet Take 1,000 Units by mouth daily.   diltiazem (TIAZAC) 120 MG 24 hr capsule Take by mouth.   ferrous sulfate 325 (65 FE) MG EC tablet Take by mouth.   folic acid (FOLVITE) 1 MG tablet Take 1 mg by mouth daily.   furosemide (LASIX) 20 MG tablet Take 1 tablet (20 mg total) by mouth daily as needed.   hydrochlorothiazide (MICROZIDE) 12.5 MG capsule Take by mouth.   HYDROcodone-acetaminophen (NORCO/VICODIN) 5-325 MG tablet Take 1 tablet by mouth every 4 (four) hours as needed.   iron sucrose in sodium chloride 0.9 % 100 mL Take 1 capsule by mouth daily.   methocarbamol (ROBAXIN) 500 MG tablet Take by mouth.   oxyCODONE (OXY IR/ROXICODONE) 5 MG immediate release tablet Take 5-10 mg by mouth every 4 (four) hours as needed.   senna-docusate (SENOKOT-S) 8.6-50 MG tablet Take by mouth.   telmisartan-hydrochlorothiazide (MICARDIS HCT) 80-12.5 MG tablet Take 1 tablet by mouth daily.   vitamin B-12 (CYANOCOBALAMIN) 1000 MCG tablet Take 1,000 mcg by mouth daily.   [DISCONTINUED] enoxaparin (LOVENOX) 40 MG/0.4ML injection Inject into the skin daily.     Review of Systems      All other systems reviewed and are otherwise negative except as noted above.  Physical Exam    VS:  BP 132/84   Pulse 63   Ht '5\' 3"'$  (1.6 m)   Wt 216 lb 9.6 oz (98.2 kg)   BMI 38.37 kg/m  , BMI Body mass index is 38.37 kg/m.  Wt Readings from Last 3 Encounters:  12/19/21 216 lb 9.6 oz (98.2 kg)  11/03/21 210 lb (95.3 kg)  09/01/21 204 lb 4.8 oz (92.7 kg)    GEN: Well nourished, overweight, well developed, in no acute distress. HEENT: normal. Neck: Supple, no JVD, carotid bruits, or masses. Cardiac: RRR, no murmurs, rubs, or gallops. No clubbing, cyanosis. Nonpitting bilateral lower extremity edema. .  Radials/PT 2+ and equal bilaterally.   Respiratory:  Respirations regular and unlabored, clear to auscultation bilaterally. GI: Soft, nontender, nondistended. MS: No deformity or atrophy. Skin: Warm and dry, no rash. Neuro:  Strength and sensation are intact. Psych: Normal affect.  Assessment & Plan    Nonobstructive CAD/HLD, LDL goal <70 - Stable with no anginal symptoms. No indication for ischemic evaluation.  Present chest pain atypical for angina "sharp" at rest associated with palpitations. Consider etiology PVC - she politely declines to increase her Diltiazem dose. GDMT includes aspirin, Rosuvastatin. Remove Atorvastatin from her med list. Update lipid panel, CMP today. Heart healthy diet and regular cardiovascular exercise encouraged.    Exertional dyspnea -  Likely deconditioning. No adventitious lung sounds on exam. Encouraged to resume exercise. Will update echo to rule out heart failure or valvular abnormality.   LE edema - Known baker's cyst contributory - upcoming f/u with ortho. LIkely etiology venous insufficiency. Echo to rule out heart failure. Start Lasix '20mg'$  QD x 3 days then PRN. Encouraged to elevate legs, low sodium diet, compression stockings.   Hypertension- Reasonably close to goal. Continue present antihypertensive regimen. Discussed to monitor BP at home at least 2 hours after medications and sitting for 5-10 minutes.   GERD- Continue Protonix, refill provided. Further treatment per primary care provider.          Disposition: Follow up  in 3-4 mos  with Skeet Latch, MD or APP.  Signed, Loel Dubonnet, NP 12/19/2021, 11:06 AM [place]

## 2021-12-19 NOTE — Progress Notes (Deleted)
Cardiology Office Note   Date:  12/19/2021   ID:  Audrey Farley, DOB 1960-11-27, MRN [id number]  PCP:  Gae Bon, NP  Cardiologist:   Skeet Latch, MD   No chief complaint on file.   History of Present Illness: Audrey Farley is a 61 y.o. female with hypertension and nonobstructive CAD who presents for follow up.  She saw Dr. Reginia Forts on 12/2015 and reported chest pain and shortness of breath.  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  She was referred for an exercise Myoview that revealed LVEF 53% with no ischemia or infarct.  She achieved 8 METS on the Bruce protocol.  She also had an echo 02/2016 that showed LVEF 55-60% and mild dilation of the ascending aorta (3.9 cm).  She had a stress 06/2019 and LVEF was 70%.  There was no ischemia. Her BP was high so she started diltiazem '120mg'$  daily. She had a repeat echocardiogram 06/2019 that revealed LVEF 60 to 65% with mild LVH.  Diastolic function was indeterminate.  Her ascending aorta was normal in size.  Her prior echocardiogram in 2017 revealed an ascending aorta of 3.9 cm.   She saw Dr. Harrell Gave on 08/2020 as an acute visit for chest pain. Her symptoms were non-exertional and atypical. She had a coronary CT that revealed minimal non-obstructive disease. Her calcium score was 96th percentile. 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 She was started on rosuvastatin 09/2020.  At her last visit 11/2020 she was doing well.  She was struggling with GERD and was started on pantoprazole.  She reported exertional dyspnea and was encouraged to work on exercise and weight loss.   Past Medical History:  Diagnosis Date   Allergy    Aneurysm of ascending aorta ([place]) 02/25/2016   3.9cm by echo 02/2016  -aorta enlarged   Arthritis    CAD in native artery 11/19/2020   Chest pain 01/24/2016   Dyspnea on exertion    HLD (hyperlipidemia)    Hypertension    Urge incontinence      Past Surgical History:  Procedure Laterality Date   ABDOMINAL HYSTERECTOMY     still has cervix; DUB; ovaries intact.   COLONOSCOPY WITH PROPOFOL N/A 11/03/2021   Procedure: COLONOSCOPY WITH PROPOFOL;  Surgeon: Lin Landsman, MD;  Location: [facility] ENDOSCOPY;  Service: Gastroenterology;  Laterality: N/A;   ESOPHAGOGASTRODUODENOSCOPY (EGD) WITH PROPOFOL N/A 11/03/2021   Procedure: ESOPHAGOGASTRODUODENOSCOPY (EGD) WITH PROPOFOL;  Surgeon: Lin Landsman, MD;  Location: [facility] ENDOSCOPY;  Service: Gastroenterology;  Laterality: N/A;   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   TOTAL KNEE ARTHROPLASTY Left 09/28/2016   Procedure: LEFT TOTAL KNEE ARTHROPLASTY;  Surgeon: Vickey Huger, MD;  Location: [place];  Service: Orthopedics;  Laterality: Left;     Current Outpatient Medications  Medication Sig Dispense Refill   acetaminophen (TYLENOL) 500 MG tablet Take by mouth.     albuterol (VENTOLIN HFA) 108 (90 Base) MCG/ACT inhaler Inhale 2 puffs into the lungs every 4 (four) hours as needed for wheezing or shortness of breath.     ascorbic acid (VITAMIN C) 250 MG tablet Take 1 tablet by mouth daily.     atorvastatin (LIPITOR) 20 MG tablet Take 20 mg by mouth at bedtime.     benzonatate (TESSALON) 100 MG capsule Take by mouth.     cholecalciferol (VITAMIN D3) 25 MCG (1000 UNIT) tablet Take 1,000 Units by  mouth daily.     diltiazem (TIAZAC) 120 MG 24 hr capsule Take by mouth.     enoxaparin (LOVENOX) 40 MG/0.4ML injection Inject into the skin daily.     ferrous sulfate 325 (65 FE) MG EC tablet Take by mouth.     folic acid (FOLVITE) 1 MG tablet Take 1 mg by mouth daily.     hydrochlorothiazide (MICROZIDE) 12.5 MG capsule Take by mouth.     HYDROcodone-acetaminophen (NORCO/VICODIN) 5-325 MG tablet Take 1 tablet by mouth every 4 (four) hours as needed. 10 tablet 0   methocarbamol (ROBAXIN) 500 MG tablet Take by mouth.     oxyCODONE (OXY IR/ROXICODONE) 5 MG immediate  release tablet Take 5-10 mg by mouth every 4 (four) hours as needed.     pantoprazole (PROTONIX) 40 MG tablet Take 1 tablet (40 mg total) by mouth daily before breakfast. 30 tablet 0   rosuvastatin (CRESTOR) 20 MG tablet Take 1 tablet (20 mg total) by mouth daily. (Patient not taking: Reported on 01/15/2021) 90 tablet 3   senna-docusate (SENOKOT-S) 8.6-50 MG tablet Take by mouth.     telmisartan-hydrochlorothiazide (MICARDIS HCT) 80-12.5 MG tablet Take 1 tablet by mouth daily. 90 tablet 0   vitamin B-12 (CYANOCOBALAMIN) 1000 MCG tablet Take 1,000 mcg by mouth daily.     No current facility-administered medications for this visit.    Allergies:   Celecoxib, Mirabegron, No known allergies, and Latex    Social History:  The patient  reports that she has never smoked. She has never used smokeless tobacco. She reports current alcohol use. She reports that she does not use drugs.   Family History:  The patient's family history includes Diabetes in her mother and sister; Epilepsy in her brother; Heart attack in her mother; Heart disease in her mother; Hyperlipidemia in her mother and sister; Hypertension in her mother and sister; Kidney disease in her mother; Mental illness in her brother; Multiple sclerosis in her sister.    ROS:   Please see the history of present illness. (+) Central chest pain (+) Shortness of breath (+) Left knee pain (+) Bilateral LE edema, persistent in left LE (+) Heartburn All other systems are reviewed and negative.    PHYSICAL EXAM: VS:  There were no vitals taken for this visit. , BMI There is no height or weight on file to calculate BMI. GENERAL:  Well appearing HEENT: Pupils equal round and reactive, fundi not visualized, oral mucosa unremarkable NECK:  No jugular venous distention, waveform within normal limits, carotid upstroke brisk and symmetric, no bruits, no thyromegaly LUNGS:  Clear to auscultation bilaterally HEART:  RRR.  PMI not displaced or  sustained,S1 and S2 within normal limits, no S3, no S4, no clicks, no rubs, no murmurs ABD:  Flat, positive bowel sounds normal in frequency in pitch, no bruits, no rebound, no guarding, no midline pulsatile mass, no hepatomegaly, no splenomegaly EXT:  2 plus pulses throughout, no edema, no cyanosis no clubbing SKIN:  No rashes no nodules. Lipoma on upper back-tender to palpation  NEURO:  Cranial nerves II through XII grossly intact, motor grossly intact throughout [facility]:  Cognitively intact, oriented to person place and time   EKG:  11/19/2020: EKG is not ordered today. 09/22/2019: EKG was not ordered. 11/26/17: Sinus bradycardia.  Rate 56 bpm.  Prior inferior infarct 01/24/16: sinus bradycardia.  Non-specific T wave abnormalities.   ABI 10/15/2020: Summary:  Right: Resting right ankle-brachial index is within normal range. No  evidence of significant right  lower extremity arterial disease. The right  toe-brachial index is normal.   Left: Resting left ankle-brachial index is within normal range. No  evidence of significant left lower extremity arterial disease. The left  toe-brachial index is normal.   CT Angio Chest and Aorta 08/23/2020: IMPRESSION: 1. No evidence of thoracic aortic aneurysm or dissection. 2. Aortic atherosclerosis, in addition to left main and 2 vessel coronary artery disease. Please note that although the presence of coronary artery calcium documents the presence of coronary artery disease, the severity of this disease and any potential stenosis cannot be assessed on this non-gated CT examination. Assessment for potential risk factor modification, dietary therapy or pharmacologic therapy may be warranted, if clinically indicated. 3. Mild cardiomegaly.   Aortic Atherosclerosis (ICD10-I70.0).  Coronary CT  08/23/2020: IMPRESSION: 1. Coronary calcium score of 206. This was 96th percentile for age, sex, and race matched control.   2. Normal coronary origin with  right dominance.   3. CAD-RADS 1. Minimal non-obstructive CAD (1-24%). Consider non-atherosclerotic causes of chest pain. Consider preventive therapy and risk factor modification.   4. There is evidence of distal left main dilation into the proximal LAD, maximal diameter 10.4 mm. Diameter meets criteria for aneurysm.  Lexiscan Myoview 06/23/2019: Electrically nondiagnostic for ischemia Normal perfusion. No ischemia or scar LVEF 70% Low risk study.  Echo 06/23/2019:  1. Left ventricular ejection fraction, by estimation, is 60 to 65%. The  left ventricle has normal function. The left ventricle has no regional  wall motion abnormalities. There is mild concentric left ventricular  hypertrophy. Left ventricular diastolic  parameters are indeterminate.   2. Right ventricular systolic function is normal. The right ventricular  size is normal. There is normal pulmonary artery systolic pressure.   3. The mitral valve is normal in structure. Trivial mitral valve  regurgitation. No evidence of mitral stenosis.   4. The aortic valve is normal in structure. Aortic valve regurgitation is  not visualized. No aortic stenosis is present.   5. The inferior vena cava is normal in size with greater than 50%  respiratory variability, suggesting right atrial pressure of 3 mmHg.   Echo 02/21/16: Study Conclusions - Left ventricle: The cavity size was normal. There was mild   concentric hypertrophy. Systolic function was normal. The   estimated ejection fraction was in the range of 55% to 60%. Wall   motion was normal; there were no regional wall motion   abnormalities. - Aortic valve: Trileaflet; normal thickness, mildly calcified   leaflets. - Aorta: Ascending aortic diameter: 39 mm (S). - Aortic root: The aortic root was normal in size. - Ascending aorta: The ascending aorta was mildly dilated. - Mitral valve: There was trivial regurgitation. - Tricuspid valve: There was trivial  regurgitation.  Exercise Myoview 02/14/16: The left ventricular ejection fraction is mildly decreased (45-54%). Nuclear stress EF: 53%. Blood pressure demonstrated a hypertensive response to exercise. Baseline EKG showed NSR with diffuse ST/T wave abnormality in the inferolateral leads. During stress there was 64m J point depression from baseline with horizontal ST segment depression in the inferolateral leads. EKG nondiagnostic due to resting EKG changes. The study is normal. This is a low risk study.   Recent Labs: 12/18/2021: Hemoglobin 12.0; Platelets 278    Lipid Panel    Component Value Date/Time   CHOL 184 11/19/2020 1000   TRIG 51 11/19/2020 1000   HDL 95 11/19/2020 1000   CHOLHDL 1.9 11/19/2020 1000   CHOLHDL 2.1 12/20/2015 1123   VLDL 9  12/20/2015 1123   LDLCALC 79 11/19/2020 1000      Wt Readings from Last 3 Encounters:  11/03/21 210 lb (95.3 kg)  09/01/21 204 lb 4.8 oz (92.7 kg)  08/15/21 203 lb (92.1 kg)      ASSESSMENT AND PLAN: No problem-specific Assessment & Plan notes found for this encounter.     Current medicines are reviewed at length with the patient today.  The patient does not have concerns regarding medicines.  The following changes have been made:  start pantoprazole  Labs/ tests ordered today include:   No orders of the defined types were placed in this encounter.    Disposition:   FU with Chondra Boyde C. Oval Linsey, MD, [facility] in 1 year    Signed, Jasmeen Fritsch C. Oval Linsey, MD, [facility]  12/19/2021 7:50 AM    [name]

## 2021-12-20 LAB — COMPREHENSIVE METABOLIC PANEL
ALT: 11 IU/L (ref 0–32)
AST: 12 IU/L (ref 0–40)
Albumin/Globulin Ratio: 1.7 (ref 1.2–2.2)
Albumin: 4.3 g/dL (ref 3.8–4.9)
Alkaline Phosphatase: 114 IU/L (ref 44–121)
BUN/Creatinine Ratio: 19 (ref 9–23)
BUN: 15 mg/dL (ref 6–24)
Bilirubin Total: 0.3 mg/dL (ref 0.0–1.2)
CO2: 23 mmol/L (ref 20–29)
Calcium: 9.4 mg/dL (ref 8.7–10.2)
Chloride: 110 mmol/L — ABNORMAL HIGH (ref 96–106)
Creatinine, Ser: 0.79 mg/dL (ref 0.57–1.00)
Globulin, Total: 2.5 g/dL (ref 1.5–4.5)
Glucose: 92 mg/dL (ref 70–99)
Potassium: 4 mmol/L (ref 3.5–5.2)
Sodium: 135 mmol/L (ref 134–144)
Total Protein: 6.8 g/dL (ref 6.0–8.5)
eGFR: 86 mL/min/{1.73_m2} (ref >59)

## 2021-12-20 LAB — LIPID PANEL
Chol/HDL Ratio: 1.9 ratio (ref 0.0–4.4)
Cholesterol, Total: 176 mg/dL (ref 100–199)
HDL: 91 mg/dL (ref >39)
LDL Chol Calc (NIH): 76 mg/dL (ref 0–99)
Triglycerides: 44 mg/dL (ref 0–149)
VLDL Cholesterol Cal: 9 mg/dL (ref 5–40)

## 2021-12-22 ENCOUNTER — Telehealth (HOSPITAL_BASED_OUTPATIENT_CLINIC_OR_DEPARTMENT_OTHER): Payer: Self-pay

## 2021-12-22 DIAGNOSIS — E785 Hyperlipidemia, unspecified: Secondary | ICD-10-CM

## 2021-12-22 MED ORDER — EZETIMIBE 10 MG PO TABS: 10.0000 mg | ORAL_TABLET | Freq: Every day | ORAL | 3 refills | Status: AC

## 2021-12-22 NOTE — Telephone Encounter (Addendum)
Called results to patient and left results on VM (ok per DPR), instructions left to call office back if patient has any questions!     ----- Message from Loel Dubonnet, NP sent at 12/20/2021 10:19 AM EDT ----- Normal kidneys, liver. Cholesterol panel with LDL (bad cholesterol) of 76. Goal less than 70. Recommend addition of Zetia '10mg'$  daily with repeat fasting lipid panel/LFT in2 months.

## 2022-01-02 ENCOUNTER — Inpatient Hospital Stay: Payer: Medicaid Other | Attending: Oncology

## 2022-01-28 ENCOUNTER — Other Ambulatory Visit (HOSPITAL_BASED_OUTPATIENT_CLINIC_OR_DEPARTMENT_OTHER): Payer: Self-pay | Admitting: Family

## 2022-01-28 DIAGNOSIS — I10 Essential (primary) hypertension: Secondary | ICD-10-CM

## 2022-01-28 DIAGNOSIS — R0609 Other forms of dyspnea: Secondary | ICD-10-CM

## 2022-01-28 DIAGNOSIS — E785 Hyperlipidemia, unspecified: Secondary | ICD-10-CM

## 2022-01-28 DIAGNOSIS — I2510 Atherosclerotic heart disease of native coronary artery without angina pectoris: Secondary | ICD-10-CM

## 2022-01-28 DIAGNOSIS — K219 Gastro-esophageal reflux disease without esophagitis: Secondary | ICD-10-CM

## 2022-02-05 ENCOUNTER — Ambulatory Visit: Payer: Medicaid Other | Attending: Family

## 2022-03-20 ENCOUNTER — Encounter: Payer: Self-pay | Admitting: Oncology

## 2022-03-27 ENCOUNTER — Ambulatory Visit: Payer: Medicaid Other

## 2022-04-06 NOTE — Progress Notes (Incomplete)
Office Visit    Patient Name: Audrey Farley Date of Encounter: 04/06/2022  PCP:  Gae Bon, NP   [place]  Cardiologist:  Skeet Latch, MD  Advanced Practice Provider:  Loel Dubonnet, NP Electrophysiologist:  None      Chief Complaint    Audrey Farley is a 62 y.o. female presents today for CAD, HTN.   Past Medical History    Past Medical History:  Diagnosis Date   Allergy    Aneurysm of ascending aorta ([place]) 02/25/2016   3.9cm by echo 02/2016  -aorta enlarged   Arthritis    CAD in native artery 11/19/2020   Chest pain 01/24/2016   Dyspnea on exertion    HLD (hyperlipidemia)    Hypertension    Urge incontinence    Past Surgical History:  Procedure Laterality Date   ABDOMINAL HYSTERECTOMY     still has cervix; DUB; ovaries intact.   COLONOSCOPY WITH PROPOFOL N/A 11/03/2021   Procedure: COLONOSCOPY WITH PROPOFOL;  Surgeon: Lin Landsman, MD;  Location: [facility] ENDOSCOPY;  Service: Gastroenterology;  Laterality: N/A;   ESOPHAGOGASTRODUODENOSCOPY (EGD) WITH PROPOFOL N/A 11/03/2021   Procedure: ESOPHAGOGASTRODUODENOSCOPY (EGD) WITH PROPOFOL;  Surgeon: Lin Landsman, MD;  Location: [facility] ENDOSCOPY;  Service: Gastroenterology;  Laterality: N/A;   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   TOTAL KNEE ARTHROPLASTY Left 09/28/2016   Procedure: LEFT TOTAL KNEE ARTHROPLASTY;  Surgeon: Vickey Huger, MD;  Location: [facility];  Service: Orthopedics;  Laterality: Left;    Allergies  Allergies  Allergen Reactions   Celecoxib Nausea Only   Mirabegron Nausea Only   No Known Allergies    Latex Itching    History of Present Illness    Audrey Farley is a 62 y.o. female with a hx of HTN, GERD, HLD, nonobstructive CAD last seen 11/2020 by Dr. Oval Linsey.  Exercise Myoview in 2017 with EF 53% with no ischemia.  She achieved 8 METS on Bruce protocol.  Echo 02/2016 EF 55 to 60%, mild dilation  ascending aorta 3.9 cm.  Stress test 06/2019 EF 70% with no ischemia.  She was started on a diltiazem due to elevated blood pressure.  Repeat echo 06/2019 LVEF 60 to 65%, mild LVH, diastolic function indeterminate, ascending aorta normal in size.  Seen 08/2020 for acute visit for chest pain.  Coronary CTA with minimal nonobstructive disease.  Calcium score placing her in the 96 percentile.  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  Last in clinic 11/2020 by Dr. Oval Linsey.  Blood pressure at home was well controlled.  She noted heartburn but no anginal symptoms.  She noted bilateral lower extremity edema worse on the left at site of prior knee replacement.  Protonix was started for acid reflux.  Her exertional dyspnea was presumed due to deconditioning. She was pending repeat knee surgery which was performed 06/2021.  Presents today for follow-up. Reports no shortness of breath at rest and worsening dyspnea on exertion over the last 2-3 months. NOtes she has been inactive due to recent knee surgeries.. Reports occasional sharp sensation in her mid chest when laying down associated with palpitations. No exertional chest pain. No  orthopnea, PND. Does not edema in her hands and knees. BP at home routinely 140s.   EKGs/Labs/Other Studies Reviewed:   The following studies were reviewed today:   ABI 10/15/2020: Summary:  Right: Resting right ankle-brachial index is within normal range. No  evidence of significant right lower extremity arterial disease. The right  toe-brachial index is normal.   Left: Resting left ankle-brachial index is within normal range. No  evidence of significant left lower extremity arterial disease. The left  toe-brachial index is normal.    CT Angio Chest and Aorta 08/23/2020: IMPRESSION: 1. No evidence of thoracic aortic aneurysm or dissection. 2. Aortic atherosclerosis, in addition to left main and 2 vessel coronary artery disease. Please  note that although the presence of coronary artery calcium documents the presence of coronary artery disease, the severity of this disease and any potential stenosis cannot be assessed on this non-gated CT examination. Assessment for potential risk factor modification, dietary therapy or pharmacologic therapy may be warranted, if clinically indicated. 3. Mild cardiomegaly.   Aortic Atherosclerosis (ICD10-I70.0).   Coronary CT  08/23/2020: IMPRESSION: 1. Coronary calcium score of 206. This was 96th percentile for age, sex, and race matched control.   2. Normal coronary origin with right dominance.   3. CAD-RADS 1. Minimal non-obstructive CAD (1-24%). Consider non-atherosclerotic causes of chest pain. Consider preventive therapy and risk factor modification.   4. There is evidence of distal left main dilation into the proximal LAD, maximal diameter 10.4 mm. Diameter meets criteria for aneurysm.   Lexiscan Myoview 06/23/2019: Electrically nondiagnostic for ischemia Normal perfusion. No ischemia or scar LVEF 70% Low risk study.   Echo 06/23/2019:  1. Left ventricular ejection fraction, by estimation, is 60 to 65%. The  left ventricle has normal function. The left ventricle has no regional  wall motion abnormalities. There is mild concentric left ventricular  hypertrophy. Left ventricular diastolic  parameters are indeterminate.   2. Right ventricular systolic function is normal. The right ventricular  size is normal. There is normal pulmonary artery systolic pressure.   3. The mitral valve is normal in structure. Trivial mitral valve  regurgitation. No evidence of mitral stenosis.   4. The aortic valve is normal in structure. Aortic valve regurgitation is  not visualized. No aortic stenosis is present.   5. The inferior vena cava is normal in size with greater than 50%  respiratory variability, suggesting right atrial pressure of 3 mmHg.    Echo 02/21/16: Study Conclusions -  Left ventricle: The cavity size was normal. There was mild   concentric hypertrophy. Systolic function was normal. The   estimated ejection fraction was in the range of 55% to 60%. Wall   motion was normal; there were no regional wall motion   abnormalities. - Aortic valve: Trileaflet; normal thickness, mildly calcified   leaflets. - Aorta: Ascending aortic diameter: 39 mm (S). - Aortic root: The aortic root was normal in size. - Ascending aorta: The ascending aorta was mildly dilated. - Mitral valve: There was trivial regurgitation. - Tricuspid valve: There was trivial regurgitation.   Exercise Myoview 02/14/16: The left ventricular ejection fraction is mildly decreased (45-54%). Nuclear stress EF: 53%. Blood pressure demonstrated a hypertensive response to exercise. Baseline EKG showed NSR with diffuse ST/T wave abnormality in the inferolateral leads. During stress there was 76m J point depression from baseline with horizontal ST segment depression in the inferolateral leads. EKG nondiagnostic due to resting EKG changes. The study is normal. This is a low risk study.    EKG:  EKG is ordered today.  The ekg ordered today demonstrates NSR 63 bpm with no acute ST/T wave changes.   Recent Labs: 12/18/2021: Hemoglobin 12.0; Platelets 278 12/19/2021: ALT 11; BUN 15; Creatinine, Ser 0.79; Potassium 4.0; Sodium  135  Recent Lipid Panel    Component Value Date/Time   CHOL 176 12/19/2021 1108   TRIG 44 12/19/2021 1108   HDL 91 12/19/2021 1108   CHOLHDL 1.9 12/19/2021 1108   CHOLHDL 2.1 12/20/2015 1123   VLDL 9 12/20/2015 1123   LDLCALC 76 12/19/2021 1108   Home Medications   No outpatient medications have been marked as taking for the 04/08/22 encounter (Appointment) with Skeet Latch, MD.     Review of Systems      All other systems reviewed and are otherwise negative except as noted above.  Physical Exam    VS:  There were no vitals taken for this visit. , BMI There is no  height or weight on file to calculate BMI.  Wt Readings from Last 3 Encounters:  12/19/21 216 lb 9.6 oz (98.2 kg)  11/03/21 210 lb (95.3 kg)  09/01/21 204 lb 4.8 oz (92.7 kg)    GEN: Well nourished, overweight, well developed, in no acute distress. HEENT: normal. Neck: Supple, no JVD, carotid bruits, or masses. Cardiac: RRR, no murmurs, rubs, or gallops. No clubbing, cyanosis. Nonpitting bilateral lower extremity edema. .  Radials/PT 2+ and equal bilaterally.  Respiratory:  Respirations regular and unlabored, clear to auscultation bilaterally. GI: Soft, nontender, nondistended. MS: No deformity or atrophy. Skin: Warm and dry, no rash. Neuro:  Strength and sensation are intact. Psych: Normal affect.  Assessment & Plan    Nonobstructive CAD/HLD, LDL goal <70 - Stable with no anginal symptoms. No indication for ischemic evaluation.  Present chest pain atypical for angina "sharp" at rest associated with palpitations. Consider etiology PVC - she politely declines to increase her Diltiazem dose. GDMT includes aspirin, Rosuvastatin. Remove Atorvastatin from her med list. Update lipid panel, CMP today. Heart healthy diet and regular cardiovascular exercise encouraged.    Exertional dyspnea - Likely deconditioning. No adventitious lung sounds on exam. Encouraged to resume exercise. Will update echo to rule out heart failure or valvular abnormality.   LE edema - Known baker's cyst contributory - upcoming f/u with ortho. LIkely etiology venous insufficiency. Echo to rule out heart failure. Start Lasix '20mg'$  QD x 3 days then PRN. Encouraged to elevate legs, low sodium diet, compression stockings.   Hypertension- Reasonably close to goal. Continue present antihypertensive regimen. Discussed to monitor BP at home at least 2 hours after medications and sitting for 5-10 minutes.   GERD- Continue Protonix, refill provided. Further treatment per primary care provider.   No BP recorded.  {Refresh Note OR  Click here to enter BP  :1}***      Disposition: Follow up  in 3-4 mos  with Skeet Latch, MD or APP.  Danielle Dess 04/06/2022, 5:09 PM [facility]

## 2022-04-08 ENCOUNTER — Encounter (HOSPITAL_BASED_OUTPATIENT_CLINIC_OR_DEPARTMENT_OTHER): Payer: Self-pay | Admitting: Cardiovascular Disease

## 2022-04-08 ENCOUNTER — Ambulatory Visit (INDEPENDENT_AMBULATORY_CARE_PROVIDER_SITE_OTHER): Payer: Medicaid Other | Admitting: Cardiovascular Disease

## 2022-04-08 VITALS — BP 158/84 | HR 62 | Ht 63.0 in | Wt 221.0 lb

## 2022-04-08 DIAGNOSIS — I2510 Atherosclerotic heart disease of native coronary artery without angina pectoris: Secondary | ICD-10-CM

## 2022-04-08 DIAGNOSIS — Z01812 Encounter for preprocedural laboratory examination: Secondary | ICD-10-CM | POA: Diagnosis not present

## 2022-04-08 DIAGNOSIS — I10 Essential (primary) hypertension: Secondary | ICD-10-CM

## 2022-04-08 DIAGNOSIS — E785 Hyperlipidemia, unspecified: Secondary | ICD-10-CM

## 2022-04-08 LAB — CBC WITH DIFFERENTIAL/PLATELET
Basophils Absolute: 0 10*3/uL (ref 0.0–0.2)
Basos: 1 %
EOS (ABSOLUTE): 0.1 10*3/uL (ref 0.0–0.4)
Eos: 2 %
Hematocrit: 39 % (ref 34.0–46.6)
Hemoglobin: 12.9 g/dL (ref 11.1–15.9)
Immature Grans (Abs): 0 10*3/uL (ref 0.0–0.1)
Immature Granulocytes: 0 %
Lymphocytes Absolute: 1.9 10*3/uL (ref 0.7–3.1)
Lymphs: 30 %
MCH: 28.4 pg (ref 26.6–33.0)
MCHC: 33.1 g/dL (ref 31.5–35.7)
MCV: 86 fL (ref 79–97)
Monocytes Absolute: 0.5 10*3/uL (ref 0.1–0.9)
Monocytes: 7 %
Neutrophils Absolute: 3.8 10*3/uL (ref 1.4–7.0)
Neutrophils: 60 %
Platelets: 229 10*3/uL (ref 150–450)
RBC: 4.55 x10E6/uL (ref 3.77–5.28)
RDW: 12.6 % (ref 11.7–15.4)
WBC: 6.4 10*3/uL (ref 3.4–10.8)

## 2022-04-08 LAB — BASIC METABOLIC PANEL
BUN/Creatinine Ratio: 20 (ref 9–23)
BUN: 15 mg/dL (ref 6–24)
CO2: 22 mmol/L (ref 20–29)
Calcium: 9.8 mg/dL (ref 8.7–10.2)
Chloride: 102 mmol/L (ref 96–106)
Creatinine, Ser: 0.76 mg/dL (ref 0.57–1.00)
Glucose: 97 mg/dL (ref 70–99)
Potassium: 4.4 mmol/L (ref 3.5–5.2)
Sodium: 141 mmol/L (ref 134–144)
eGFR: 90 mL/min/{1.73_m2} (ref >59)

## 2022-04-08 MED ORDER — SODIUM CHLORIDE 0.9% FLUSH: 3.0000 mL | Freq: Two times a day (BID) | INTRAVENOUS | Status: AC

## 2022-04-08 MED ORDER — AMLODIPINE BESYLATE 10 MG PO TABS: 10.0000 mg | ORAL_TABLET | Freq: Every day | ORAL | 3 refills | Status: DC

## 2022-04-08 NOTE — Patient Instructions (Signed)
Medication Instructions:  STOP DILTIAZEM   START AMLODIPINE 10 MG DAILY   Labwork: CBC/BMET TODAY   Testing/Procedures: Your physician has requested that you have a cardiac catheterization. Cardiac catheterization is used to diagnose and/or treat various heart conditions. Doctors may recommend this procedure for a number of different reasons. The most common reason is to evaluate chest pain. Chest pain can be a symptom of coronary artery disease (CAD), and cardiac catheterization can show whether plaque is narrowing or blocking your heart's arteries. This procedure is also used to evaluate the valves, as well as measure the blood flow and oxygen levels in different parts of your heart. For further information please visit [email or web address]. Please follow instruction sheet, as given.  Follow-Up: 05/07/2022 8:50 AM WITH Overton Mam NP   Any Other Special Instructions Will Be Listed Below (If Applicable).   [facility] AT [facility] [address] [address] [place] [address] Dept: [phone]  Audrey Farley  04/08/2022  You are scheduled for a Cardiac Catheterization on Monday, February 5 with Dr. Harrell Gave End.  1. Please arrive at the [facility] (Main Entrance A) at [facility]: [place],  [id number] at 7:00 AM (This time is two hours before your procedure to ensure your preparation). Free valet parking service is available.   Special note: Every effort is made to have your procedure done on time. Please understand that emergencies sometimes delay scheduled procedures.  2. Diet: Do not eat solid foods after midnight.  The patient may have clear liquids until 5am upon the day of the procedure.  3. Labs: TODAY   4. Medication instructions in preparation for your procedure:   Contrast Allergy: No  On the morning of your procedure, take your ASPIRIN  and any morning medicines  NOT listed above.  You may use sips of water.  5. Plan for one night stay--bring personal belongings. 6. Bring a current list of your medications and current insurance cards. 7. You MUST have a responsible person to drive you home. 8. Someone MUST be with you the first 24 hours after you arrive home or your discharge will be delayed. 9. Please wear clothes that are easy to get on and off and wear slip-on shoes.  Thank you for allowing [place] to care for you!   -- [facility]  If you need a refill on your cardiac medications before your next appointment, please call your pharmacy.

## 2022-04-08 NOTE — Progress Notes (Signed)
Cardiology Office Note   Date:  04/08/22   ID:  Audrey Farley, DOB 1960/11/12, MRN [id number]  PCP:  Gae Bon, NP  Cardiologist:   Skeet Latch, MD   No chief complaint on file.     History of Present Illness: Audrey Farley is a 62 y.o. female with hypertension who presents for follow up.  She saw Dr. Reginia Forts on 12/2015 and reported chest pain and shortness of breath.  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  She was referred for an exercise Myoview that revealed LVEF 53% with no ischemia.  She achieved 8 METS on the [facility] protocol.  She also had an echo 02/2016 that showed LVEF 55-60% and mild dilation of the ascending aorta (3.9 cm).  She had a stress 06/2019  And LVEF was 70%.  There was no ischemia. Her BP was high so she started diltiazem '120mg'$  daily. She had a repeat echocardiogram 06/2019 that revealed LVEF 60 to 65% with mild LVH.  Diastolic function was indeterminate.  Her ascending aorta was normal in size.  Her prior echocardiogram in 2017 revealed an ascending aorta of 3.9 cm.   She saw Dr. Harrell Gave on 08/2020 as an acute visit for chest pain. Her symptoms were non-exertional and atypical. She had a coronary CT that revealed minimal non-obstructive disease. Her calcium score was 96th percentile. 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 She was started on rosuvastatin 09/2020.   On 11/19/20, she reported her blood pressure has lately been averaging in the 120s-130s. Previously her blood pressure has been averaging over 150-160. She occasionally experienced chest pain in her central chest when lying down. She developed acute heartburn the week before, which she attributed to eating peppermint. She endorsed constant bilateral LE edema that improved while lying down at night. However, her left LE was persistently swollen. Additionally, she had an area of tenderness in her upper back.  She was seen by Laurann Montana, NP on 12/19/21. She reported an occasional sharp sensation in her mid chest when laying down associated with palpitations. Her BP at home routinely 140s. She noted a then recent knee surgeries. ekg showed NSR 63 bpm with no acute ST/T wave changes. Consider etiology PVC - she politely declined to increase her Diltiazem dose. GDMT included aspirin, Rosuvastatin. Atorvastatin was removed from her med list. A lipid panel and CMP were updated and revealed HDL of 91, LDL of 76, and Trig of 44. An ECHO was ordered but not completed. She was started on Lasix 20 mg QD x 3 days then PRN. She continued her antihypertensive regimen.   Today, she reports that she has recently had 2 surgeries and acquired an infection. She complains of chest pain when lying down which is stable and unchanged from baseline. She describes soreness and sharp pain.However, she recently noticed pain occurring when walking long distances as well. She compliantly takes Protonix daily in the mornings. Her episodes occur every other night and when walking in grocery stores. Her BP at home is generally high. She reports that her BP on January 29th, 2024 at a clinic was 183/104. Her BP in clinic today was 148/90. Upon recheck, her BP was 158/84. Her daughter regularly cooks for her at home. She also endorses arm and shoulder issues. She will FU with a neurologist today. She reports that she recently began iron infusions. She denies any palpitations, shortness of breath, or peripheral edema. No lightheadedness,  headaches, syncope, orthopnea, or PND.  Past Medical History:  Diagnosis Date   Allergy    Aneurysm of ascending aorta ([facility]) 02/25/2016   3.9cm by echo 02/2016  -aorta enlarged   Arthritis    CAD in native artery 11/19/2020   Chest pain 01/24/2016   Dyspnea on exertion    HLD (hyperlipidemia)    Hypertension    Urge incontinence     Past Surgical History:  Procedure Laterality Date   ABDOMINAL HYSTERECTOMY     still has  cervix; DUB; ovaries intact.   COLONOSCOPY WITH PROPOFOL N/A 11/03/2021   Procedure: COLONOSCOPY WITH PROPOFOL;  Surgeon: Lin Landsman, MD;  Location: [facility] ENDOSCOPY;  Service: Gastroenterology;  Laterality: N/A;   ESOPHAGOGASTRODUODENOSCOPY (EGD) WITH PROPOFOL N/A 11/03/2021   Procedure: ESOPHAGOGASTRODUODENOSCOPY (EGD) WITH PROPOFOL;  Surgeon: Lin Landsman, MD;  Location: [facility] ENDOSCOPY;  Service: Gastroenterology;  Laterality: N/A;   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   TOTAL KNEE ARTHROPLASTY Left 09/28/2016   Procedure: LEFT TOTAL KNEE ARTHROPLASTY;  Surgeon: Vickey Huger, MD;  Location: [place];  Service: Orthopedics;  Laterality: Left;     Current Outpatient Medications  Medication Sig Dispense Refill   acetaminophen (TYLENOL) 500 MG tablet Take by mouth.     albuterol (VENTOLIN HFA) 108 (90 Base) MCG/ACT inhaler Inhale 2 puffs into the lungs every 4 (four) hours as needed for wheezing or shortness of breath.     amLODipine (NORVASC) 10 MG tablet Take 1 tablet (10 mg total) by mouth daily. 180 tablet 3   ascorbic acid (VITAMIN C) 250 MG tablet Take 1 tablet by mouth daily.     aspirin EC 81 MG tablet Take 81 mg by mouth daily. Swallow whole.     benzonatate (TESSALON) 100 MG capsule Take by mouth.     cholecalciferol (VITAMIN D3) 25 MCG (1000 UNIT) tablet Take 1,000 Units by mouth daily.     ezetimibe (ZETIA) 10 MG tablet Take 1 tablet (10 mg total) by mouth daily. 90 tablet 3   ferrous sulfate 325 (65 FE) MG EC tablet Take by mouth.     folic acid (FOLVITE) 1 MG tablet Take 1 mg by mouth daily.     hydrochlorothiazide (MICROZIDE) 12.5 MG capsule Take by mouth.     HYDROcodone-acetaminophen (NORCO/VICODIN) 5-325 MG tablet Take 1 tablet by mouth every 4 (four) hours as needed. 10 tablet 0   iron sucrose in sodium chloride 0.9 % 100 mL Take 1 capsule by mouth daily.     methocarbamol (ROBAXIN) 500 MG tablet Take by mouth.     oxyCODONE (OXY  IR/ROXICODONE) 5 MG immediate release tablet Take 5-10 mg by mouth every 4 (four) hours as needed.     pantoprazole (PROTONIX) 40 MG tablet Take 1 tablet (40 mg total) by mouth daily before breakfast. 90 tablet 3   rosuvastatin (CRESTOR) 20 MG tablet Take 1 tablet (20 mg total) by mouth daily. 90 tablet 3   senna-docusate (SENOKOT-S) 8.6-50 MG tablet Take by mouth.     telmisartan-hydrochlorothiazide (MICARDIS HCT) 80-12.5 MG tablet Take 1 tablet by mouth daily. 90 tablet 0   vitamin B-12 (CYANOCOBALAMIN) 1000 MCG tablet Take 1,000 mcg by mouth daily.     furosemide (LASIX) 20 MG tablet Take 1 tablet (20 mg total) by mouth daily as needed. 30 tablet 2   Current Facility-Administered Medications  Medication Dose Route Frequency Provider Last Rate Last Admin   sodium chloride flush (NS) 0.9 %  injection 3 mL  3 mL Intravenous Q12H Skeet Latch, MD        Allergies:   Celecoxib, Mirabegron, No known allergies, and Latex    Social History:  The patient  reports that she has never smoked. She has never used smokeless tobacco. She reports current alcohol use. She reports that she does not use drugs.   Family History:  The patient's family history includes Diabetes in her mother and sister; Epilepsy in her brother; Heart attack in her mother; Heart disease in her mother; Hyperlipidemia in her mother and sister; Hypertension in her mother and sister; Kidney disease in her mother; Mental illness in her brother; Multiple sclerosis in her sister.    ROS:   Please see the history of present illness. (+) chest pain All other systems are reviewed and negative.    PHYSICAL EXAM: VS:  BP (!) 158/84 (BP Location: Right Arm, Patient Position: Sitting, Cuff Size: Normal)   Pulse 62   Ht '5\' 3"'$  (1.6 m)   Wt 221 lb (100.2 kg)   BMI 39.15 kg/m  , BMI Body mass index is 39.15 kg/m. GENERAL:  Well appearing HEENT: Pupils equal round and reactive, fundi not visualized, oral mucosa unremarkable NECK:   No jugular venous distention, waveform within normal limits, carotid upstroke brisk and symmetric, no bruits, no thyromegaly LUNGS:  Clear to auscultation bilaterally HEART:  RRR.  PMI not displaced or sustained,S1 and S2 within normal limits, no S3, no S4, no clicks, no rubs, no murmurs ABD:  Flat, positive bowel sounds normal in frequency in pitch, no bruits, no rebound, no guarding, no midline pulsatile mass, no hepatomegaly, no splenomegaly EXT:  2 plus pulses throughout, no edema, no cyanosis no clubbing SKIN:  No rashes no nodules. Lipoma on upper back-tender to palpation  NEURO:  Cranial nerves II through XII grossly intact, motor grossly intact throughout PSYCH:  Cognitively intact, oriented to person place and time   EKG: EKG is personally reviewed. 04/08/22: sinus rhythm,  62 bpm 11/19/2020: EKG is not ordered today. 09/22/2019: EKG was not ordered. 11/26/17: Sinus bradycardia.  Rate 56 bpm.  Prior inferior infarct 01/24/16: sinus bradycardia.  Non-specific T wave abnormalities.   ABI 10/15/2020: Summary:  Right: Resting right ankle-brachial index is within normal range. No  evidence of significant right lower extremity arterial disease. The right  toe-brachial index is normal.   Left: Resting left ankle-brachial index is within normal range. No  evidence of significant left lower extremity arterial disease. The left  toe-brachial index is normal.   CT Angio Chest and Aorta 08/23/2020: IMPRESSION: 1. No evidence of thoracic aortic aneurysm or dissection. 2. Aortic atherosclerosis, in addition to left main and 2 vessel coronary artery disease. Please note that although the presence of coronary artery calcium documents the presence of coronary artery disease, the severity of this disease and any potential stenosis cannot be assessed on this non-gated CT examination. Assessment for potential risk factor modification, dietary therapy or pharmacologic therapy may be warranted, if  clinically indicated. 3. Mild cardiomegaly.   Aortic Atherosclerosis (ICD10-I70.0).  Coronary CT  08/23/2020: IMPRESSION: 1. Coronary calcium score of 206. This was 96th percentile for age, sex, and race matched control.   2. Normal coronary origin with right dominance.   3. CAD-RADS 1. Minimal non-obstructive CAD (1-24%). Consider non-atherosclerotic causes of chest pain. Consider preventive therapy and risk factor modification.   4. There is evidence of distal left main dilation into the proximal LAD, maximal diameter 10.4  mm. Diameter meets criteria for aneurysm.  Lexiscan Myoview 06/23/2019: Electrically nondiagnostic for ischemia Normal perfusion. No ischemia or scar LVEF 70% Low risk study.  Echo 06/23/2019:  1. Left ventricular ejection fraction, by estimation, is 60 to 65%. The  left ventricle has normal function. The left ventricle has no regional  wall motion abnormalities. There is mild concentric left ventricular  hypertrophy. Left ventricular diastolic  parameters are indeterminate.   2. Right ventricular systolic function is normal. The right ventricular  size is normal. There is normal pulmonary artery systolic pressure.   3. The mitral valve is normal in structure. Trivial mitral valve  regurgitation. No evidence of mitral stenosis.   4. The aortic valve is normal in structure. Aortic valve regurgitation is  not visualized. No aortic stenosis is present.   5. The inferior vena cava is normal in size with greater than 50%  respiratory variability, suggesting right atrial pressure of 3 mmHg.   Echo 02/21/16: Study Conclusions - Left ventricle: The cavity size was normal. There was mild   concentric hypertrophy. Systolic function was normal. The   estimated ejection fraction was in the range of 55% to 60%. Wall   motion was normal; there were no regional wall motion   abnormalities. - Aortic valve: Trileaflet; normal thickness, mildly calcified   leaflets. -  Aorta: Ascending aortic diameter: 39 mm (S). - Aortic root: The aortic root was normal in size. - Ascending aorta: The ascending aorta was mildly dilated. - Mitral valve: There was trivial regurgitation. - Tricuspid valve: There was trivial regurgitation.  Exercise Myoview 02/14/16: The left ventricular ejection fraction is mildly decreased (45-54%). Nuclear stress EF: 53%. Blood pressure demonstrated a hypertensive response to exercise. Baseline EKG showed NSR with diffuse ST/T wave abnormality in the inferolateral leads. During stress there was 58m J point depression from baseline with horizontal ST segment depression in the inferolateral leads. EKG nondiagnostic due to resting EKG changes. The study is normal. This is a low risk study.   Recent Labs: 12/18/2021: Hemoglobin 12.0; Platelets 278 12/19/2021: ALT 11; BUN 15; Creatinine, Ser 0.79; Potassium 4.0; Sodium 135    Lipid Panel    Component Value Date/Time   CHOL 176 12/19/2021 1108   TRIG 44 12/19/2021 1108   HDL 91 12/19/2021 1108   CHOLHDL 1.9 12/19/2021 1108   CHOLHDL 2.1 12/20/2015 1123   VLDL 9 12/20/2015 1123   LDLCALC 76 12/19/2021 1108      Wt Readings from Last 3 Encounters:  04/08/22 221 lb (100.2 kg)  12/19/21 216 lb 9.6 oz (98.2 kg)  11/03/21 210 lb (95.3 kg)      ASSESSMENT AND PLAN: Essential hypertension Blood pressure is uncontrolled.  It has been elevated both here and at home.  She has had orthopedic issues and has not been getting much exercise.  She is gained a significant amount of weight in the last 6 months.  I suspect that this and pain are contributing.  We are going to switch diltiazem to amlodipine 10 mg daily.  Continue telmisartan and HCTZ.  Ultimately this can all be combined into 1 tablet.  She is going to track her blood pressures at home and bring to follow-up.  Blood pressure goal is less than 130/80.  We will have her seen in our advanced hypertension clinic and do an evaluation of  secondary causes of her blood pressure remains uncontrolled on this regimen.  CAD in native artery Ms. Kovacik had minimal CAD on her coronary CTA  in 2022.  However she continues to have chest pain.  She does have chest pain at night which I suspect is related to a GI etiology such as GERD or esophageal spasm.  However she now also reports exertional chest pain.  She has had stress test and coronary CTA's.  I think we should pursue cardiac catheterization at this time to give a definitive answer.  Continue aspirin and statin.  Hyperlipidemia Continue rosuvastatin as above.   Current medicines are reviewed at length with the patient today.  The patient does not have concerns regarding medicines.  The following changes have been made:  stop diltiazem.  Start amlodipine '10mg'$  daily  Labs/ tests ordered today include:   Orders Placed This Encounter  Procedures   CBC with Differential/Platelet   Basic metabolic panel   EKG 49-IYME    Disposition:   FU in 1 month with Laurann Montana, NP   I,Mitra Faeizi,acting as a scribe for Skeet Latch, MD.,have documented all relevant documentation on the behalf of Skeet Latch, MD,as directed by  Skeet Latch, MD while in the presence of Skeet Latch, MD.  I, Venturia Oval Linsey, MD have reviewed all documentation for this visit.  The documentation of the exam, diagnosis, procedures, and orders on 04/08/2022 are all accurate and complete.   Signed, Imaad Reuss C. Oval Linsey, MD, [facility]  04/08/2022 1:30 PM    [place]

## 2022-04-08 NOTE — Assessment & Plan Note (Signed)
Ms. Hemler had minimal CAD on her coronary CTA in 2022.  However she continues to have chest pain.  She does have chest pain at night which I suspect is related to a GI etiology such as GERD or esophageal spasm.  However she now also reports exertional chest pain.  She has had stress test and coronary CTA's.  I think we should pursue cardiac catheterization at this time to give a definitive answer.  Continue aspirin and statin.

## 2022-04-08 NOTE — Assessment & Plan Note (Signed)
Continue rosuvastatin as above.

## 2022-04-08 NOTE — H&P (View-Only) (Signed)
Cardiology Office Note   Date:  04/08/22   ID:  Audrey Farley, DOB [birth year], MRN [id number]  PCP:  Audrey Bon, NP  Cardiologist:   Skeet Latch, MD   No chief complaint on file.     History of Present Illness: Audrey Farley is a 62 y.o. female with hypertension who presents for follow up.  She saw Dr. Reginia Forts on 12/2015 and reported chest pain and shortness of breath.  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  She was referred for an exercise Myoview that revealed LVEF 53% with no ischemia.  She achieved 8 METS on the Bruce protocol.  She also had an echo 02/2016 that showed LVEF 55-60% and mild dilation of the ascending aorta (3.9 cm).  She had a stress 06/2019  And LVEF was 70%.  There was no ischemia. Her BP was high so she started diltiazem '120mg'$  daily. She had a repeat echocardiogram 06/2019 that revealed LVEF 60 to 65% with mild LVH.  Diastolic function was indeterminate.  Her ascending aorta was normal in size.  Her prior echocardiogram in 2017 revealed an ascending aorta of 3.9 cm.   She saw Dr. Harrell Gave on 08/2020 as an acute visit for chest pain. Her symptoms were non-exertional and atypical. She had a coronary CT that revealed minimal non-obstructive disease. Her calcium score was 96th percentile. 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 She was started on rosuvastatin 09/2020.   On 11/19/20, she reported her blood pressure has lately been averaging in the 120s-130s. Previously her blood pressure has been averaging over 150-160. She occasionally experienced chest pain in her central chest when lying down. She developed acute heartburn the week before, which she attributed to eating peppermint. She endorsed constant bilateral LE edema that improved while lying down at night. However, her left LE was persistently swollen. Additionally, she had an area of tenderness in her upper back.  She was seen by Laurann Montana, NP on 12/19/21. She reported an occasional sharp sensation in her mid chest when laying down associated with palpitations. Her BP at home routinely 140s. She noted a then recent knee surgeries. ekg showed NSR 63 bpm with no acute ST/T wave changes. Consider etiology PVC - she politely declined to increase her Diltiazem dose. GDMT included aspirin, Rosuvastatin. Atorvastatin was removed from her med list. A lipid panel and CMP were updated and revealed HDL of 91, LDL of 76, and Trig of 44. An ECHO was ordered but not completed. She was started on Lasix 20 mg QD x 3 days then PRN. She continued her antihypertensive regimen.   Today, she reports that she has recently had 2 surgeries and acquired an infection. She complains of chest pain when lying down which is stable and unchanged from baseline. She describes soreness and sharp pain.However, she recently noticed pain occurring when walking long distances as well. She compliantly takes Protonix daily in the mornings. Her episodes occur every other night and when walking in grocery stores. Her BP at home is generally high. She reports that her BP on January 29th, 2024 at a clinic was 183/104. Her BP in clinic today was 148/90. Upon recheck, her BP was 158/84. Her daughter regularly cooks for her at home. She also endorses arm and shoulder issues. She will FU with a neurologist today. She reports that she recently began iron infusions. She denies any palpitations, shortness of breath, or peripheral edema. No lightheadedness,  headaches, syncope, orthopnea, or PND.  Past Medical History:  Diagnosis Date   Allergy    Aneurysm of ascending aorta ([place]) 02/25/2016   3.9cm by echo 02/2016  -aorta enlarged   Arthritis    CAD in native artery 11/19/2020   Chest pain 01/24/2016   Dyspnea on exertion    HLD (hyperlipidemia)    Hypertension    Urge incontinence     Past Surgical History:  Procedure Laterality Date   ABDOMINAL HYSTERECTOMY     still has  cervix; DUB; ovaries intact.   COLONOSCOPY WITH PROPOFOL N/A 11/03/2021   Procedure: COLONOSCOPY WITH PROPOFOL;  Surgeon: Lin Landsman, MD;  Location: [facility] ENDOSCOPY;  Service: Gastroenterology;  Laterality: N/A;   ESOPHAGOGASTRODUODENOSCOPY (EGD) WITH PROPOFOL N/A 11/03/2021   Procedure: ESOPHAGOGASTRODUODENOSCOPY (EGD) WITH PROPOFOL;  Surgeon: Lin Landsman, MD;  Location: [facility] ENDOSCOPY;  Service: Gastroenterology;  Laterality: N/A;   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   TOTAL KNEE ARTHROPLASTY Left 09/28/2016   Procedure: LEFT TOTAL KNEE ARTHROPLASTY;  Surgeon: Vickey Huger, MD;  Location: [place];  Service: Orthopedics;  Laterality: Left;     Current Outpatient Medications  Medication Sig Dispense Refill   acetaminophen (TYLENOL) 500 MG tablet Take by mouth.     albuterol (VENTOLIN HFA) 108 (90 Base) MCG/ACT inhaler Inhale 2 puffs into the lungs every 4 (four) hours as needed for wheezing or shortness of breath.     amLODipine (NORVASC) 10 MG tablet Take 1 tablet (10 mg total) by mouth daily. 180 tablet 3   ascorbic acid (VITAMIN C) 250 MG tablet Take 1 tablet by mouth daily.     aspirin EC 81 MG tablet Take 81 mg by mouth daily. Swallow whole.     benzonatate (TESSALON) 100 MG capsule Take by mouth.     cholecalciferol (VITAMIN D3) 25 MCG (1000 UNIT) tablet Take 1,000 Units by mouth daily.     ezetimibe (ZETIA) 10 MG tablet Take 1 tablet (10 mg total) by mouth daily. 90 tablet 3   ferrous sulfate 325 (65 FE) MG EC tablet Take by mouth.     folic acid (FOLVITE) 1 MG tablet Take 1 mg by mouth daily.     hydrochlorothiazide (MICROZIDE) 12.5 MG capsule Take by mouth.     HYDROcodone-acetaminophen (NORCO/VICODIN) 5-325 MG tablet Take 1 tablet by mouth every 4 (four) hours as needed. 10 tablet 0   iron sucrose in sodium chloride 0.9 % 100 mL Take 1 capsule by mouth daily.     methocarbamol (ROBAXIN) 500 MG tablet Take by mouth.     oxyCODONE (OXY  IR/ROXICODONE) 5 MG immediate release tablet Take 5-10 mg by mouth every 4 (four) hours as needed.     pantoprazole (PROTONIX) 40 MG tablet Take 1 tablet (40 mg total) by mouth daily before breakfast. 90 tablet 3   rosuvastatin (CRESTOR) 20 MG tablet Take 1 tablet (20 mg total) by mouth daily. 90 tablet 3   senna-docusate (SENOKOT-S) 8.6-50 MG tablet Take by mouth.     telmisartan-hydrochlorothiazide (MICARDIS HCT) 80-12.5 MG tablet Take 1 tablet by mouth daily. 90 tablet 0   vitamin B-12 (CYANOCOBALAMIN) 1000 MCG tablet Take 1,000 mcg by mouth daily.     furosemide (LASIX) 20 MG tablet Take 1 tablet (20 mg total) by mouth daily as needed. 30 tablet 2   Current Facility-Administered Medications  Medication Dose Route Frequency Provider Last Rate Last Admin   sodium chloride flush (NS) 0.9 %  injection 3 mL  3 mL Intravenous Q12H Skeet Latch, MD        Allergies:   Celecoxib, Mirabegron, No known allergies, and Latex    Social History:  The patient  reports that she has never smoked. She has never used smokeless tobacco. She reports current alcohol use. She reports that she does not use drugs.   Family History:  The patient's family history includes Diabetes in her mother and sister; Epilepsy in her brother; Heart attack in her mother; Heart disease in her mother; Hyperlipidemia in her mother and sister; Hypertension in her mother and sister; Kidney disease in her mother; Mental illness in her brother; Multiple sclerosis in her sister.    ROS:   Please see the history of present illness. (+) chest pain All other systems are reviewed and negative.    PHYSICAL EXAM: VS:  BP (!) 158/84 (BP Location: Right Arm, Patient Position: Sitting, Cuff Size: Normal)   Pulse 62   Ht '5\' 3"'$  (1.6 m)   Wt 221 lb (100.2 kg)   BMI 39.15 kg/m  , BMI Body mass index is 39.15 kg/m. GENERAL:  Well appearing HEENT: Pupils equal round and reactive, fundi not visualized, oral mucosa unremarkable NECK:   No jugular venous distention, waveform within normal limits, carotid upstroke brisk and symmetric, no bruits, no thyromegaly LUNGS:  Clear to auscultation bilaterally HEART:  RRR.  PMI not displaced or sustained,S1 and S2 within normal limits, no S3, no S4, no clicks, no rubs, no murmurs ABD:  Flat, positive bowel sounds normal in frequency in pitch, no bruits, no rebound, no guarding, no midline pulsatile mass, no hepatomegaly, no splenomegaly EXT:  2 plus pulses throughout, no edema, no cyanosis no clubbing SKIN:  No rashes no nodules. Lipoma on upper back-tender to palpation  NEURO:  Cranial nerves II through XII grossly intact, motor grossly intact throughout PSYCH:  Cognitively intact, oriented to person place and time   EKG: EKG is personally reviewed. 04/08/22: sinus rhythm,  62 bpm 11/19/2020: EKG is not ordered today. 09/22/2019: EKG was not ordered. 11/26/17: Sinus bradycardia.  Rate 56 bpm.  Prior inferior infarct 01/24/16: sinus bradycardia.  Non-specific T wave abnormalities.   ABI 10/15/2020: Summary:  Right: Resting right ankle-brachial index is within normal range. No  evidence of significant right lower extremity arterial disease. The right  toe-brachial index is normal.   Left: Resting left ankle-brachial index is within normal range. No  evidence of significant left lower extremity arterial disease. The left  toe-brachial index is normal.   CT Angio Chest and Aorta 08/23/2020: IMPRESSION: 1. No evidence of thoracic aortic aneurysm or dissection. 2. Aortic atherosclerosis, in addition to left main and 2 vessel coronary artery disease. Please note that although the presence of coronary artery calcium documents the presence of coronary artery disease, the severity of this disease and any potential stenosis cannot be assessed on this non-gated CT examination. Assessment for potential risk factor modification, dietary therapy or pharmacologic therapy may be warranted, if  clinically indicated. 3. Mild cardiomegaly.   Aortic Atherosclerosis (ICD10-I70.0).  Coronary CT  08/23/2020: IMPRESSION: 1. Coronary calcium score of 206. This was 96th percentile for age, sex, and race matched control.   2. Normal coronary origin with right dominance.   3. CAD-RADS 1. Minimal non-obstructive CAD (1-24%). Consider non-atherosclerotic causes of chest pain. Consider preventive therapy and risk factor modification.   4. There is evidence of distal left main dilation into the proximal LAD, maximal diameter 10.4  mm. Diameter meets criteria for aneurysm.  Lexiscan Myoview 06/23/2019: Electrically nondiagnostic for ischemia Normal perfusion. No ischemia or scar LVEF 70% Low risk study.  Echo 06/23/2019:  1. Left ventricular ejection fraction, by estimation, is 60 to 65%. The  left ventricle has normal function. The left ventricle has no regional  wall motion abnormalities. There is mild concentric left ventricular  hypertrophy. Left ventricular diastolic  parameters are indeterminate.   2. Right ventricular systolic function is normal. The right ventricular  size is normal. There is normal pulmonary artery systolic pressure.   3. The mitral valve is normal in structure. Trivial mitral valve  regurgitation. No evidence of mitral stenosis.   4. The aortic valve is normal in structure. Aortic valve regurgitation is  not visualized. No aortic stenosis is present.   5. The inferior vena cava is normal in size with greater than 50%  respiratory variability, suggesting right atrial pressure of 3 mmHg.   Echo 02/21/16: Study Conclusions - Left ventricle: The cavity size was normal. There was mild   concentric hypertrophy. Systolic function was normal. The   estimated ejection fraction was in the range of 55% to 60%. Wall   motion was normal; there were no regional wall motion   abnormalities. - Aortic valve: Trileaflet; normal thickness, mildly calcified   leaflets. -  Aorta: Ascending aortic diameter: 39 mm (S). - Aortic root: The aortic root was normal in size. - Ascending aorta: The ascending aorta was mildly dilated. - Mitral valve: There was trivial regurgitation. - Tricuspid valve: There was trivial regurgitation.  Exercise Myoview 02/14/16: The left ventricular ejection fraction is mildly decreased (45-54%). Nuclear stress EF: 53%. Blood pressure demonstrated a hypertensive response to exercise. Baseline EKG showed NSR with diffuse ST/T wave abnormality in the inferolateral leads. During stress there was 56m J point depression from baseline with horizontal ST segment depression in the inferolateral leads. EKG nondiagnostic due to resting EKG changes. The study is normal. This is a low risk study.   Recent Labs: 12/18/2021: Hemoglobin 12.0; Platelets 278 12/19/2021: ALT 11; BUN 15; Creatinine, Ser 0.79; Potassium 4.0; Sodium 135    Lipid Panel    Component Value Date/Time   CHOL 176 12/19/2021 1108   TRIG 44 12/19/2021 1108   HDL 91 12/19/2021 1108   CHOLHDL 1.9 12/19/2021 1108   CHOLHDL 2.1 12/20/2015 1123   VLDL 9 12/20/2015 1123   LDLCALC 76 12/19/2021 1108      Wt Readings from Last 3 Encounters:  04/08/22 221 lb (100.2 kg)  12/19/21 216 lb 9.6 oz (98.2 kg)  11/03/21 210 lb (95.3 kg)      ASSESSMENT AND PLAN: Essential hypertension Blood pressure is uncontrolled.  It has been elevated both here and at home.  She has had orthopedic issues and has not been getting much exercise.  She is gained a significant amount of weight in the last 6 months.  I suspect that this and pain are contributing.  We are going to switch diltiazem to amlodipine 10 mg daily.  Continue telmisartan and HCTZ.  Ultimately this can all be combined into 1 tablet.  She is going to track her blood pressures at home and bring to follow-up.  Blood pressure goal is less than 130/80.  We will have her seen in our advanced hypertension clinic and do an evaluation of  secondary causes of her blood pressure remains uncontrolled on this regimen.  CAD in native artery Ms. Fahr had minimal CAD on her coronary CTA  in 2022.  However she continues to have chest pain.  She does have chest pain at night which I suspect is related to a GI etiology such as GERD or esophageal spasm.  However she now also reports exertional chest pain.  She has had stress test and coronary CTA's.  I think we should pursue cardiac catheterization at this time to give a definitive answer.  Continue aspirin and statin.  Hyperlipidemia Continue rosuvastatin as above.   Current medicines are reviewed at length with the patient today.  The patient does not have concerns regarding medicines.  The following changes have been made:  stop diltiazem.  Start amlodipine '10mg'$  daily  Labs/ tests ordered today include:   Orders Placed This Encounter  Procedures   CBC with Differential/Platelet   Basic metabolic panel   EKG [id number]    Disposition:   FU in 1 month with Laurann Montana, NP   I,Mitra Faeizi,acting as a scribe for Skeet Latch, MD.,have documented all relevant documentation on the behalf of Skeet Latch, MD,as directed by  Skeet Latch, MD while in the presence of Skeet Latch, MD.  I, Weather[place] Oval Linsey, MD have reviewed all documentation for this visit.  The documentation of the exam, diagnosis, procedures, and orders on 04/08/2022 are all accurate and complete.   Signed, Nyanna Heideman C. Oval Linsey, MD, [facility]  04/08/2022 1:30 PM    [place]

## 2022-04-08 NOTE — Assessment & Plan Note (Signed)
Blood pressure is uncontrolled.  It has been elevated both here and at home.  She has had orthopedic issues and has not been getting much exercise.  She is gained a significant amount of weight in the last 6 months.  I suspect that this and pain are contributing.  We are going to switch diltiazem to amlodipine 10 mg daily.  Continue telmisartan and HCTZ.  Ultimately this can all be combined into 1 tablet.  She is going to track her blood pressures at home and bring to follow-up.  Blood pressure goal is less than 130/80.  We will have her seen in our advanced hypertension clinic and do an evaluation of secondary causes of her blood pressure remains uncontrolled on this regimen.

## 2022-04-09 ENCOUNTER — Telehealth: Payer: Self-pay | Admitting: *Deleted

## 2022-04-09 ENCOUNTER — Ambulatory Visit (HOSPITAL_BASED_OUTPATIENT_CLINIC_OR_DEPARTMENT_OTHER): Payer: Medicaid Other | Admitting: Cardiovascular Disease

## 2022-04-09 NOTE — Telephone Encounter (Signed)
Cardiac Catheterization scheduled at [facility] for Monday April 13, 2022 9 AM Arrival time and place: [facility] Main Entrance A at: 7 AM  Nothing to eat after midnight prior to procedure, clear liquids until 5 AM day of procedure.  Medication instructions: -Hold:  HCTZ/Lasix-AM of procedure  Telmisartan/HCTZ- AM of procedure -Any other usual morning medications can be taken with sips of water including aspirin 81 mg.  Confirmed patient has responsible adult to drive home post procedure and be with patient first 24 hours after arriving home.  Patient reports no new symptoms concerning for COVID-19 in the past 10 days.  Reviewed procedure instructions with patient.

## 2022-04-13 ENCOUNTER — Ambulatory Visit (HOSPITAL_COMMUNITY)
Admission: RE | Admit: 2022-04-13 | Discharge: 2022-04-13 | Disposition: A | Discharge status: Home / Self Care | Payer: Medicaid Other | Attending: Internal Medicine | Admitting: Internal Medicine

## 2022-04-13 ENCOUNTER — Ambulatory Visit (HOSPITAL_COMMUNITY)
Admission: RE | Disposition: A | Discharge status: Home / Self Care | Payer: Self-pay | Source: Home / Self Care | Attending: Internal Medicine

## 2022-04-13 ENCOUNTER — Encounter (HOSPITAL_COMMUNITY): Payer: Self-pay | Admitting: Internal Medicine

## 2022-04-13 ENCOUNTER — Other Ambulatory Visit: Payer: Self-pay

## 2022-04-13 DIAGNOSIS — Z7982 Long term (current) use of aspirin: Secondary | ICD-10-CM | POA: Diagnosis not present

## 2022-04-13 DIAGNOSIS — I2510 Atherosclerotic heart disease of native coronary artery without angina pectoris: Secondary | ICD-10-CM | POA: Diagnosis not present

## 2022-04-13 DIAGNOSIS — R079 Chest pain, unspecified: Secondary | ICD-10-CM | POA: Diagnosis present

## 2022-04-13 DIAGNOSIS — I2584 Coronary atherosclerosis due to calcified coronary lesion: Secondary | ICD-10-CM | POA: Insufficient documentation

## 2022-04-13 DIAGNOSIS — Z79899 Other long term (current) drug therapy: Secondary | ICD-10-CM | POA: Insufficient documentation

## 2022-04-13 DIAGNOSIS — I253 Aneurysm of heart: Secondary | ICD-10-CM | POA: Diagnosis not present

## 2022-04-13 DIAGNOSIS — Z8249 Family history of ischemic heart disease and other diseases of the circulatory system: Secondary | ICD-10-CM | POA: Insufficient documentation

## 2022-04-13 DIAGNOSIS — I10 Essential (primary) hypertension: Secondary | ICD-10-CM | POA: Insufficient documentation

## 2022-04-13 HISTORY — PX: LEFT HEART CATH AND CORONARY ANGIOGRAPHY: CATH118249

## 2022-04-13 SURGERY — LEFT HEART CATH AND CORONARY ANGIOGRAPHY
Anesthesia: LOCAL

## 2022-04-13 MED ORDER — VERAPAMIL HCL 2.5 MG/ML IV SOLN
INTRAVENOUS | Status: DC | PRN
  Administered 2022-04-13: 10 mL via INTRA_ARTERIAL

## 2022-04-13 MED ORDER — HYDRALAZINE HCL 20 MG/ML IJ SOLN: 10.0000 mg | INTRAMUSCULAR | Status: DC | PRN

## 2022-04-13 MED ORDER — HEPARIN SODIUM (PORCINE) 1000 UNIT/ML IJ SOLN
INTRAMUSCULAR | Status: AC
  Filled 2022-04-13: qty 10

## 2022-04-13 MED ORDER — LIDOCAINE HCL (PF) 1 % IJ SOLN
INTRAMUSCULAR | Status: AC
  Filled 2022-04-13: qty 30

## 2022-04-13 MED ORDER — LABETALOL HCL 5 MG/ML IV SOLN: 10.0000 mg | INTRAVENOUS | Status: DC | PRN

## 2022-04-13 MED ORDER — LIDOCAINE HCL (PF) 1 % IJ SOLN
INTRAMUSCULAR | Status: DC | PRN
  Administered 2022-04-13: 2 mL via INTRADERMAL

## 2022-04-13 MED ORDER — SODIUM CHLORIDE 0.9 % WEIGHT BASED INFUSION
3.0000 mL/kg/h | INTRAVENOUS | Status: AC
  Administered 2022-04-13: 3 mL/kg/h via INTRAVENOUS

## 2022-04-13 MED ORDER — VERAPAMIL HCL 2.5 MG/ML IV SOLN
INTRAVENOUS | Status: AC
  Filled 2022-04-13: qty 2

## 2022-04-13 MED ORDER — SODIUM CHLORIDE 0.9 % IV SOLN: 250.0000 mL | INTRAVENOUS | Status: DC | PRN

## 2022-04-13 MED ORDER — SODIUM CHLORIDE 0.9% FLUSH: 3.0000 mL | INTRAVENOUS | Status: DC | PRN

## 2022-04-13 MED ORDER — HEPARIN (PORCINE) IN NACL 1000-0.9 UT/500ML-% IV SOLN
INTRAVENOUS | Status: AC
  Filled 2022-04-13: qty 1000

## 2022-04-13 MED ORDER — FENTANYL CITRATE (PF) 100 MCG/2ML IJ SOLN
INTRAMUSCULAR | Status: DC | PRN
  Administered 2022-04-13 (×2): 25 ug via INTRAVENOUS

## 2022-04-13 MED ORDER — MIDAZOLAM HCL 2 MG/2ML IJ SOLN
INTRAMUSCULAR | Status: DC | PRN
  Administered 2022-04-13 (×2): 1 mg via INTRAVENOUS

## 2022-04-13 MED ORDER — ONDANSETRON HCL 4 MG/2ML IJ SOLN: 4.0000 mg | Freq: Four times a day (QID) | INTRAMUSCULAR | Status: DC | PRN

## 2022-04-13 MED ORDER — SODIUM CHLORIDE 0.9% FLUSH: 3.0000 mL | Freq: Two times a day (BID) | INTRAVENOUS | Status: DC

## 2022-04-13 MED ORDER — FENTANYL CITRATE (PF) 100 MCG/2ML IJ SOLN
INTRAMUSCULAR | Status: AC
  Filled 2022-04-13: qty 2

## 2022-04-13 MED ORDER — HEPARIN SODIUM (PORCINE) 1000 UNIT/ML IJ SOLN
INTRAMUSCULAR | Status: DC | PRN
  Administered 2022-04-13: 5000 [IU] via INTRAVENOUS

## 2022-04-13 MED ORDER — ASPIRIN 81 MG PO CHEW: 81.0000 mg | CHEWABLE_TABLET | ORAL | Status: DC

## 2022-04-13 MED ORDER — SODIUM CHLORIDE 0.9 % IV SOLN: INTRAVENOUS | Status: DC

## 2022-04-13 MED ORDER — ACETAMINOPHEN 325 MG PO TABS: 650.0000 mg | ORAL_TABLET | ORAL | Status: DC | PRN

## 2022-04-13 MED ORDER — MIDAZOLAM HCL 2 MG/2ML IJ SOLN
INTRAMUSCULAR | Status: AC
  Filled 2022-04-13: qty 2

## 2022-04-13 MED ORDER — IOHEXOL 350 MG/ML SOLN
INTRAVENOUS | Status: DC | PRN
  Administered 2022-04-13: 40 mL

## 2022-04-13 MED ORDER — SODIUM CHLORIDE 0.9 % WEIGHT BASED INFUSION: 1.0000 mL/kg/h | INTRAVENOUS | Status: DC

## 2022-04-13 SURGICAL SUPPLY — 11 items
CATH 5FR JL3.5 JR4 ANG PIG MP (CATHETERS) IMPLANT
DEVICE RAD COMP TR BAND LRG (VASCULAR PRODUCTS) IMPLANT
GLIDESHEATH SLEND SS 6F .021 (SHEATH) IMPLANT
GUIDEWIRE INQWIRE 1.5J.035X260 (WIRE) IMPLANT
INQWIRE 1.5J .035X260CM (WIRE) ×1
KIT HEART LEFT (KITS) ×2 IMPLANT
PACK CARDIAC CATHETERIZATION (CUSTOM PROCEDURE TRAY) ×2 IMPLANT
SHEATH PROBE COVER 6X72 (BAG) IMPLANT
SYR MEDRAD MARK 7 150ML (SYRINGE) ×2 IMPLANT
TRANSDUCER W/STOPCOCK (MISCELLANEOUS) ×2 IMPLANT
TUBING CIL FLEX 10 FLL-RA (TUBING) ×2 IMPLANT

## 2022-04-13 NOTE — Progress Notes (Signed)
TR BAND REMOVAL  LOCATION:    right radial  DEFLATED PER PROTOCOL:    Yes.    TIME BAND OFF / DRESSING APPLIED:    1245 gauze dressing applied   SITE UPON ARRIVAL:    Level 0  SITE AFTER BAND REMOVAL:    Level 0  CIRCULATION SENSATION AND MOVEMENT:    Within Normal Limits   Yes.    COMMENTS:   no issues

## 2022-04-13 NOTE — Discharge Instructions (Signed)

## 2022-04-13 NOTE — Interval H&P Note (Signed)
History and Physical Interval Note:  04/13/2022 9:56 AM  Audrey Farley  has presented today for surgery, with the diagnosis of chest pain.  The various methods of treatment have been discussed with the patient and family. After consideration of risks, benefits and other options for treatment, the patient has consented to  Procedure(s): LEFT HEART CATH AND CORONARY ANGIOGRAPHY (N/A) as a surgical intervention.  The patient's history has been reviewed, patient examined, no change in status, stable for surgery.  I have reviewed the patient's chart and labs.  Questions were answered to the patient's satisfaction.    Cath Lab Visit (complete for each Cath Lab visit)  Clinical Evaluation Leading to the Procedure:   ACS: No.  Non-ACS:    Anginal Classification: CCS IV  Anti-ischemic medical therapy: Minimal Therapy (1 class of medications)  Non-Invasive Test Results: Low-risk stress test findings: cardiac mortality <1%/year (MPI 2021, coronary CTA 2022)  Prior CABG: No previous CABG  Mariany Mackintosh

## 2022-04-14 ENCOUNTER — Encounter: Payer: Self-pay | Admitting: Oncology

## 2022-04-15 MED FILL — Heparin Sod (Porcine)-NaCl IV Soln 1000 Unit/500ML-0.9%: INTRAVENOUS | Qty: 1000 | Status: AC

## 2022-04-17 ENCOUNTER — Telehealth (HOSPITAL_BASED_OUTPATIENT_CLINIC_OR_DEPARTMENT_OTHER): Payer: Self-pay

## 2022-04-17 NOTE — Telephone Encounter (Addendum)
Results called to patient who verbalizes understanding!    ----- Message from Loel Dubonnet, NP sent at 04/16/2022 10:10 PM EST ----- CBC with no evidence of anemia nor infection.  Stable kidney function. Normal electrolytes. Good result!

## 2022-04-21 ENCOUNTER — Encounter (INDEPENDENT_AMBULATORY_CARE_PROVIDER_SITE_OTHER): Payer: Self-pay | Admitting: Vascular Surgery

## 2022-04-21 ENCOUNTER — Ambulatory Visit (INDEPENDENT_AMBULATORY_CARE_PROVIDER_SITE_OTHER): Payer: Medicaid Other | Admitting: Vascular Surgery

## 2022-04-21 VITALS — BP 127/86 | HR 78 | Resp 18 | Ht 63.5 in | Wt 218.0 lb

## 2022-04-21 DIAGNOSIS — I10 Essential (primary) hypertension: Secondary | ICD-10-CM | POA: Diagnosis not present

## 2022-04-21 DIAGNOSIS — I872 Venous insufficiency (chronic) (peripheral): Secondary | ICD-10-CM

## 2022-04-21 DIAGNOSIS — E785 Hyperlipidemia, unspecified: Secondary | ICD-10-CM

## 2022-04-21 DIAGNOSIS — I7121 Aneurysm of the ascending aorta, without rupture: Secondary | ICD-10-CM

## 2022-04-21 DIAGNOSIS — M79604 Pain in right leg: Secondary | ICD-10-CM

## 2022-04-21 DIAGNOSIS — M79605 Pain in left leg: Secondary | ICD-10-CM

## 2022-04-21 NOTE — Assessment & Plan Note (Signed)
This was found to be just less than 4 cm on the previous echocardiogram as well as CT scan of the chest.  This can be checked every 2 to 3 years with CT scan for follow-up.

## 2022-04-21 NOTE — Progress Notes (Signed)
MRN : [id number]  Audrey Farley is a 62 y.o. (1960/04/04) female who presents with chief complaint of  Chief Complaint  Patient presents with   Follow-up    Follow up- hasn't been seen in a while and just wanted to be seen   .  History of Present Illness: Patient returns today in follow up of leg pain and venous disease.  She was last seen in 2022.  She had some significant orthopedic issues which took precedence and were the primary cause of her pain at that time but did have some venous disease.  She has had to have 3 left knee operations and sounds like she had a ruptured Baker's cyst on the right leg.  This has caused her some problems that she does have pain and swelling in the lower extremities.  She denies any open wounds or infection.  Both legs bother her, but the left is bothering her more than the right.  She is also noticing some more pain with activity. Another vascular issue to follow-up on was some ectasia of the thoracic aorta.  CT scan performed 2022 showed an approximately 3.8 cm ascending thoracic aorta.  This has not been seen since.  Current Outpatient Medications  Medication Sig Dispense Refill   acetaminophen (TYLENOL) 500 MG tablet Take by mouth.     albuterol (VENTOLIN HFA) 108 (90 Base) MCG/ACT inhaler Inhale 2 puffs into the lungs every 4 (four) hours as needed for wheezing or shortness of breath.     amLODipine (NORVASC) 10 MG tablet Take 1 tablet (10 mg total) by mouth daily. 180 tablet 3   ascorbic acid (VITAMIN C) 250 MG tablet Take 1 tablet by mouth daily.     aspirin EC 81 MG tablet Take 81 mg by mouth daily. Swallow whole.     benzonatate (TESSALON) 100 MG capsule Take by mouth.     cholecalciferol (VITAMIN D3) 25 MCG (1000 UNIT) tablet Take 1,000 Units by mouth daily.     ezetimibe (ZETIA) 10 MG tablet Take 1 tablet (10 mg total) by mouth daily. 90 tablet 3   famotidine (PEPCID) 20 MG tablet Take 20 mg by mouth at bedtime.     ferrous sulfate 325 (65  FE) MG EC tablet Take by mouth.     folic acid (FOLVITE) 1 MG tablet Take 1 mg by mouth daily.     hydrochlorothiazide (MICROZIDE) 12.5 MG capsule Take by mouth.     HYDROcodone-acetaminophen (NORCO/VICODIN) 5-325 MG tablet Take 1 tablet by mouth every 4 (four) hours as needed. 10 tablet 0   iron sucrose in sodium chloride 0.9 % 100 mL Take 1 capsule by mouth daily.     methocarbamol (ROBAXIN) 500 MG tablet Take by mouth.     nortriptyline (PAMELOR) 10 MG capsule Take by mouth.     oxyCODONE (OXY IR/ROXICODONE) 5 MG immediate release tablet Take 5-10 mg by mouth every 4 (four) hours as needed.     pantoprazole (PROTONIX) 40 MG tablet Take 1 tablet (40 mg total) by mouth daily before breakfast. 90 tablet 3   vitamin B-12 (CYANOCOBALAMIN) 1000 MCG tablet Take 1,000 mcg by mouth daily.     CREON 36000-114000 units CPEP capsule Take by mouth.     furosemide (LASIX) 20 MG tablet Take 1 tablet (20 mg total) by mouth daily as needed. 30 tablet 2   rosuvastatin (CRESTOR) 20 MG tablet Take 1 tablet (20 mg total) by mouth daily. (Patient not taking: Reported on 04/21/2022)  90 tablet 3   senna-docusate (SENOKOT-S) 8.6-50 MG tablet Take by mouth. (Patient not taking: Reported on 04/21/2022)     telmisartan-hydrochlorothiazide (MICARDIS HCT) 80-12.5 MG tablet Take 1 tablet by mouth daily. (Patient not taking: Reported on 04/21/2022) 90 tablet 0   Current Facility-Administered Medications  Medication Dose Route Frequency Provider Last Rate Last Admin   sodium chloride flush (NS) 0.9 % injection 3 mL  3 mL Intravenous Q12H Skeet Latch, MD        Past Medical History:  Diagnosis Date   Allergy    Aneurysm of ascending aorta ([place]) 02/25/2016   3.9cm by echo 02/2016  -aorta enlarged   Arthritis    CAD in native artery 11/19/2020   Chest pain 01/24/2016   Dyspnea on exertion    HLD (hyperlipidemia)    Hypertension    Urge incontinence     Past Surgical History:  Procedure Laterality Date    ABDOMINAL HYSTERECTOMY     still has cervix; DUB; ovaries intact.   COLONOSCOPY WITH PROPOFOL N/A 11/03/2021   Procedure: COLONOSCOPY WITH PROPOFOL;  Surgeon: Lin Landsman, MD;  Location: [facility] ENDOSCOPY;  Service: Gastroenterology;  Laterality: N/A;   ESOPHAGOGASTRODUODENOSCOPY (EGD) WITH PROPOFOL N/A 11/03/2021   Procedure: ESOPHAGOGASTRODUODENOSCOPY (EGD) WITH PROPOFOL;  Surgeon: Lin Landsman, MD;  Location: [facility] ENDOSCOPY;  Service: Gastroenterology;  Laterality: N/A;   KNEE ARTHROSCOPY Left 2012   LEFT HEART CATH AND CORONARY ANGIOGRAPHY N/A 04/13/2022   Procedure: LEFT HEART CATH AND CORONARY ANGIOGRAPHY;  Surgeon: Nelva Bush, MD;  Location: [place] CV LAB;  Service: Cardiovascular;  Laterality: N/A;   TOTAL KNEE ARTHROPLASTY Left 09/28/2016   TOTAL KNEE ARTHROPLASTY Left 09/28/2016   Procedure: LEFT TOTAL KNEE ARTHROPLASTY;  Surgeon: Vickey Huger, MD;  Location: [place];  Service: Orthopedics;  Laterality: Left;     Social History   Tobacco Use   Smoking status: Never   Smokeless tobacco: Never  Vaping Use   Vaping Use: Never used  Substance Use Topics   Alcohol use: Yes    Comment: occ vodka.NONE LAST 24HRS   Drug use: No      Family History  Problem Relation Age of Onset   Diabetes Mother    Hypertension Mother    Heart attack Mother    Hyperlipidemia Mother    Heart disease Mother        heart problems; no AMI   Kidney disease Mother    Hyperlipidemia Sister    Hypertension Sister    Multiple sclerosis Sister    Mental illness Brother    Epilepsy Brother    Diabetes Sister    Sudden death Neg Hx     Allergies  Allergen Reactions   Celecoxib Nausea Only   Mirabegron Nausea Only   Latex Itching      REVIEW OF SYSTEMS (Negative unless checked)   Constitutional: []$ Weight loss  []$ Fever  []$ Chills Cardiac: [x]$ Chest pain   []$ Chest pressure   []$ Palpitations   []$ Shortness of breath when laying flat   []$ Shortness of breath at rest    []$ Shortness of breath with exertion. Vascular:  []$ Pain in legs with walking   []$ Pain in legs at rest   []$ Pain in legs when laying flat   []$ Claudication   []$ Pain in feet when walking  []$ Pain in feet at rest  []$ Pain in feet when laying flat   []$ History of DVT   []$ Phlebitis   [x]$ Swelling in legs   []$ Varicose veins   []$ Non-healing ulcers Pulmonary:   []$   Uses home oxygen   []$ Productive cough   []$ Hemoptysis   []$ Wheeze  []$ COPD   []$ Asthma Neurologic:  []$ Dizziness  []$ Blackouts   []$ Seizures   []$ History of stroke   []$ History of TIA  []$ Aphasia   []$ Temporary blindness   []$ Dysphagia   []$ Weakness or numbness in arms   []$ Weakness or numbness in legs Musculoskeletal:  [x]$ Arthritis   []$ Joint swelling   [x]$ Joint pain   []$ Low back pain Hematologic:  []$ Easy bruising  []$ Easy bleeding   []$ Hypercoagulable state   []$ Anemic  []$ Hepatitis Gastrointestinal:  []$ Blood in stool   []$ Vomiting blood  []$ Gastroesophageal reflux/heartburn   []$ Abdominal pain Genitourinary:  []$ Chronic kidney disease   []$ Difficult urination  []$ Frequent urination  []$ Burning with urination   []$ Hematuria Skin:  []$ Rashes   []$ Ulcers   []$ Wounds Psychological:  []$ History of anxiety   []$  History of major depression.  Physical Examination  BP 127/86 (BP Location: Right Arm)   Pulse 78   Resp 18   Ht 5' 3.5" (1.613 m)   Wt 218 lb (98.9 kg)   BMI 38.01 kg/m  Gen:  WD/WN, NAD Head: [place]/AT, No temporalis wasting. Ear/Nose/Throat: Hearing grossly intact, nares w/o erythema or drainage Eyes: Conjunctiva clear. Sclera non-icteric Neck: Supple.  Trachea midline Pulmonary:  Good air movement, no use of accessory muscles.  Cardiac: RRR, no JVD Vascular:  Vessel Right Left  Radial Palpable Palpable               Musculoskeletal: M/S 5/5 throughout.  No deformity or atrophy. Trace BLE edema. Neurologic: Sensation grossly intact in extremities.  Symmetrical.  Speech is fluent.  Psychiatric: Judgment intact, Mood & affect appropriate for pt's clinical  situation. Dermatologic: No rashes or ulcers noted.  No cellulitis or open wounds.      Labs Recent Results (from the past 2160 hour(s))  CBC with Differential/Platelet     Status: None   Collection Time: 04/08/22  9:31 AM  Result Value Ref Range   WBC 6.4 3.4 - 10.8 x10E3/uL   RBC 4.55 3.77 - 5.28 x10E6/uL   Hemoglobin 12.9 11.1 - 15.9 g/dL   Hematocrit 39.0 34.0 - 46.6 %   MCV 86 79 - 97 fL   MCH 28.4 26.6 - 33.0 pg   MCHC 33.1 31.5 - 35.7 g/dL   RDW 12.6 11.7 - 15.4 %   Platelets 229 150 - 450 x10E3/uL   Neutrophils 60 Not Estab. %   Lymphs 30 Not Estab. %   Monocytes 7 Not Estab. %   Eos 2 Not Estab. %   Basos 1 Not Estab. %   Neutrophils Absolute 3.8 1.4 - 7.0 x10E3/uL   Lymphocytes Absolute 1.9 0.7 - 3.1 x10E3/uL   Monocytes Absolute 0.5 0.1 - 0.9 x10E3/uL   EOS (ABSOLUTE) 0.1 0.0 - 0.4 x10E3/uL   Basophils Absolute 0.0 0.0 - 0.2 x10E3/uL   Immature Granulocytes 0 Not Estab. %   Immature Grans (Abs) 0.0 0.0 - 0.1 [id number]  Basic metabolic panel     Status: None   Collection Time: 04/08/22  9:31 AM  Result Value Ref Range   Glucose 97 70 - 99 mg/dL   BUN 15 6 - 24 mg/dL   Creatinine, Ser 0.76 0.57 - 1.00 mg/dL   eGFR 90 >59 mL/min/1.73   BUN/Creatinine Ratio 20 9 - 23   Sodium 141 134 - 144 mmol/L   Potassium 4.4 3.5 - 5.2 mmol/L   Chloride 102 96 - 106 mmol/L   CO2 22 20 - 29 mmol/L  Calcium 9.8 8.7 - 10.2 mg/dL    Radiology CARDIAC CATHETERIZATION  Result Date: 04/13/2022 Conclusions: Mild, non-obstructive atherosclerotic coronary artery disease.  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 Low normal left ventricular systolic function with subtle mid anterior hypokinesis (LVEF 50-55%). Mildly elevated left ventricular filling pressure (LVEDP 23 mmHg). Recommendations: Continue statin therapy and blood pressure control.  Recommend indefinite aspirin 81 mg daily in the setting of coronary  artery ectasia/aneurysms. Proceed with echo to better assess LVEF. Consider escalation of diuresis/medical therapy of HFpEF. Nelva Bush, MD [facility]   Assessment/Plan Essential hypertension blood pressure control important in reducing the progression of atherosclerotic disease. On appropriate oral medications.     Hyperlipidemia lipid control important in reducing the progression of atherosclerotic disease. Continue statin therapy  Aneurysm of ascending aorta (HCC) This was found to be just less than 4 cm on the previous echocardiogram as well as CT scan of the chest.  This can be checked every 2 to 3 years with CT scan for follow-up.  Chronic venous insufficiency May very well be contributing to her lower extremity symptoms.  Reflux study in the near future at her convenience  Pain in limb Likely multifactorial but we will check ABIs and reflux study in the near future at her convenience.  Has significant orthopedic issues which have been the main culprit in the past    Leotis Pain, MD  04/21/2022 11:40 AM    This note was created with Dragon medical transcription system.  Any errors from dictation are purely unintentional

## 2022-04-21 NOTE — Assessment & Plan Note (Signed)
Likely multifactorial but we will check ABIs and reflux study in the near future at her convenience.  Has significant orthopedic issues which have been the main culprit in the past

## 2022-04-21 NOTE — Assessment & Plan Note (Signed)
May very well be contributing to her lower extremity symptoms.  Reflux study in the near future at her convenience

## 2022-04-23 ENCOUNTER — Ambulatory Visit: Payer: Medicaid Other | Attending: Family

## 2022-04-23 DIAGNOSIS — R0609 Other forms of dyspnea: Secondary | ICD-10-CM

## 2022-04-23 DIAGNOSIS — I2510 Atherosclerotic heart disease of native coronary artery without angina pectoris: Secondary | ICD-10-CM

## 2022-04-23 LAB — ECHOCARDIOGRAM COMPLETE
AR max vel: 1.99 cm2
AV Area VTI: 2.07 cm2
AV Area mean vel: 2 cm2
AV Mean grad: 3 mmHg
AV Peak grad: 6.1 mmHg
Ao pk vel: 1.23 m/s
Area-P 1/2: 4.8 cm2
Calc EF: 52.9 %
S' Lateral: 3.4 cm
Single Plane A2C EF: 48.6 %
Single Plane A4C EF: 55.6 %

## 2022-04-24 ENCOUNTER — Telehealth (HOSPITAL_BASED_OUTPATIENT_CLINIC_OR_DEPARTMENT_OTHER): Payer: Self-pay

## 2022-04-24 NOTE — Telephone Encounter (Addendum)
Results called to patient who verbalizes understanding!     ----- Message from Loel Dubonnet, NP sent at 04/24/2022  7:30 AM EST ----- Echocardiogram with normal heart pumping function. Mild leaking of the mitral valve which is very common and not of concern. We prevent this from worsening by keeping blood pressure well controlled.. No significant abnormalities that would cause shortness of breath. Good result!

## 2022-05-04 ENCOUNTER — Inpatient Hospital Stay: Payer: Medicaid Other | Attending: Oncology

## 2022-05-04 ENCOUNTER — Inpatient Hospital Stay (HOSPITAL_BASED_OUTPATIENT_CLINIC_OR_DEPARTMENT_OTHER): Payer: Medicaid Other | Admitting: Oncology

## 2022-05-04 ENCOUNTER — Encounter: Payer: Self-pay | Admitting: Oncology

## 2022-05-04 VITALS — BP 134/81 | HR 70 | Temp 97.0°F | Resp 18 | Ht 63.0 in | Wt 218.7 lb

## 2022-05-04 DIAGNOSIS — Z8639 Personal history of other endocrine, nutritional and metabolic disease: Secondary | ICD-10-CM

## 2022-05-04 DIAGNOSIS — E538 Deficiency of other specified B group vitamins: Secondary | ICD-10-CM

## 2022-05-04 DIAGNOSIS — D649 Anemia, unspecified: Secondary | ICD-10-CM | POA: Diagnosis present

## 2022-05-04 DIAGNOSIS — Z79899 Other long term (current) drug therapy: Secondary | ICD-10-CM | POA: Diagnosis not present

## 2022-05-04 DIAGNOSIS — D509 Iron deficiency anemia, unspecified: Secondary | ICD-10-CM

## 2022-05-04 LAB — CBC WITH DIFFERENTIAL/PLATELET
Abs Immature Granulocytes: 0.01 10*3/uL (ref 0.00–0.07)
Basophils Absolute: 0 10*3/uL (ref 0.0–0.1)
Basophils Relative: 1 %
Eosinophils Absolute: 0.2 10*3/uL (ref 0.0–0.5)
Eosinophils Relative: 4 %
HCT: 38.8 % (ref 36.0–46.0)
Hemoglobin: 12.7 g/dL (ref 12.0–15.0)
Immature Granulocytes: 0 %
Lymphocytes Relative: 44 %
Lymphs Abs: 2.5 10*3/uL (ref 0.7–4.0)
MCH: 28.5 pg (ref 26.0–34.0)
MCHC: 32.7 g/dL (ref 30.0–36.0)
MCV: 87.2 fL (ref 80.0–100.0)
Monocytes Absolute: 0.4 10*3/uL (ref 0.1–1.0)
Monocytes Relative: 7 %
Neutro Abs: 2.6 10*3/uL (ref 1.7–7.7)
Neutrophils Relative %: 44 %
Platelets: 245 10*3/uL (ref 150–400)
RBC: 4.45 MIL/uL (ref 3.87–5.11)
RDW: 12.6 % (ref 11.5–15.5)
WBC: 5.8 10*3/uL (ref 4.0–10.5)
nRBC: 0 % (ref 0.0–0.2)

## 2022-05-04 LAB — IRON AND TIBC
Iron: 83 ug/dL (ref 28–170)
Saturation Ratios: 25 % (ref 10.4–31.8)
TIBC: 332 ug/dL (ref 250–450)
UIBC: 249 ug/dL

## 2022-05-04 LAB — FERRITIN: Ferritin: 259 ng/mL (ref 11–307)

## 2022-05-04 LAB — VITAMIN B12: Vitamin B-12: 288 pg/mL (ref 180–914)

## 2022-05-04 NOTE — Progress Notes (Signed)
No concerns for the provider.

## 2022-05-04 NOTE — Addendum Note (Signed)
Addended by: Luella Cook on: 05/04/2022 01:37 PM   Modules accepted: Orders

## 2022-05-04 NOTE — Progress Notes (Signed)
Hematology/Oncology Consult note [facility]  [phone] [phone]  Patient Care Team: Gae Bon, NP as PCP - General (Nurse Practitioner) Skeet Latch, MD as PCP - Cardiology (Cardiology) Sindy Guadeloupe, MD as Consulting Physician (Hematology and Oncology) Loel Dubonnet, NP as Nurse Practitioner (Cardiology)   Name of the patient: Audrey Farley  [id number]  [birth year]   Date of visit: 05/04/22  Diagnosis- normocytic anemia likely multifactorial secondary to chronic disease as well as component of iron and B12 deficiency   Chief complaint/ Reason for visit-routine follow-up of anemia  Heme/Onc history: patient is a 62 year old African-American female with a past medical history significant for hypertension hyperlipidemia GERD among other medical problems.  She has been referred to [place] for anemia.Most recent CBC from November 2022 showed a hemoglobin of 11 that was normocytic.  Prior to that her hemoglobin was 10 in July 2022.  B12 levels low at 197 and folate levels which were previously abnormal in July normalized to 7.9.  Serum iron low at 20 and iron saturation 8%.  Patient denies any family history of cancer.  Denies any blood loss in her stool or urine.  Denies any dark melanotic stool.  Denies any consistent use of NSAIDs.  Results of anemia workup showed 0.2 g of IgG lambda M protein.  Free light chain ratio normal.  No evidence of hemolysis.  B12 levels normal  Interval history-patient has been following up with neurology for her tremors.  Denies any blood loss in her stool or urine.  ECOG PS- 1 Pain scale- 0   Review of systems- Review of Systems  Constitutional:  Negative for chills, fever, malaise/fatigue and weight loss.  HENT:  Negative for congestion, ear discharge and nosebleeds.   Eyes:  Negative for blurred vision.  Respiratory:  Negative for cough, hemoptysis, sputum production, shortness of breath  and wheezing.   Cardiovascular:  Negative for chest pain, palpitations, orthopnea and claudication.  Gastrointestinal:  Negative for abdominal pain, blood in stool, constipation, diarrhea, heartburn, melena, nausea and vomiting.  Genitourinary:  Negative for dysuria, flank pain, frequency, hematuria and urgency.  Musculoskeletal:  Negative for back pain, joint pain and myalgias.  Skin:  Negative for rash.  Neurological:  Positive for tremors. Negative for dizziness, tingling, focal weakness, seizures, weakness and headaches.  Endo/Heme/Allergies:  Does not bruise/bleed easily.  Psychiatric/Behavioral:  Negative for depression and suicidal ideas. The patient does not have insomnia.       Allergies  Allergen Reactions   Celecoxib Nausea Only   Mirabegron Nausea Only   Latex Itching     Past Medical History:  Diagnosis Date   Allergy    Aneurysm of ascending aorta ([facility]) 02/25/2016   3.9cm by echo 02/2016  -aorta enlarged   Arthritis    CAD in native artery 11/19/2020   Chest pain 01/24/2016   Dyspnea on exertion    HLD (hyperlipidemia)    Hypertension    Urge incontinence      Past Surgical History:  Procedure Laterality Date   ABDOMINAL HYSTERECTOMY     still has cervix; DUB; ovaries intact.   COLONOSCOPY WITH PROPOFOL N/A 11/03/2021   Procedure: COLONOSCOPY WITH PROPOFOL;  Surgeon: Lin Landsman, MD;  Location: [facility] ENDOSCOPY;  Service: Gastroenterology;  Laterality: N/A;   ESOPHAGOGASTRODUODENOSCOPY (EGD) WITH PROPOFOL N/A 11/03/2021   Procedure: ESOPHAGOGASTRODUODENOSCOPY (EGD) WITH PROPOFOL;  Surgeon: Lin Landsman, MD;  Location: [facility] ENDOSCOPY;  Service: Gastroenterology;  Laterality: N/A;  KNEE ARTHROSCOPY Left 2012   LEFT HEART CATH AND CORONARY ANGIOGRAPHY N/A 04/13/2022   Procedure: LEFT HEART CATH AND CORONARY ANGIOGRAPHY;  Surgeon: Nelva Bush, MD;  Location: [place] CV LAB;  Service: Cardiovascular;  Laterality: N/A;   TOTAL KNEE ARTHROPLASTY  Left 09/28/2016   TOTAL KNEE ARTHROPLASTY Left 09/28/2016   Procedure: LEFT TOTAL KNEE ARTHROPLASTY;  Surgeon: Vickey Huger, MD;  Location: [place];  Service: Orthopedics;  Laterality: Left;    Social History   Socioeconomic History   Marital status: Single    Spouse name: Not on file   Number of children: Not on file   Years of education: Not on file   Highest education level: Not on file  Occupational History   Not on file  Tobacco Use   Smoking status: Never   Smokeless tobacco: Never  Vaping Use   Vaping Use: Never used  Substance and Sexual Activity   Alcohol use: Yes    Comment: occ vodka.NONE LAST 24HRS   Drug use: No   Sexual activity: Yes    Birth control/protection: Surgical, Post-menopausal  Other Topics Concern   Not on file  Social History Narrative   Marital status: single; not dating since 2014      Children: 2 daughters (16, 36); no grandchildren      Lives: with 2 daughters       Employment: tobacco company; Stage manager x 16 years; loves job!  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        Tobacco: none      Alcohol; weekends/socially      Drugs: none      Exercise:  Two days per week; cardio      Seatbelt: 100%; no texting      Sexual activity: not sexually active since 2014; dates males; no STDs; total partners = 7.   Social Determinants of Health   Financial Resource Strain: Low Risk  (11/19/2020)   Overall Financial Resource Strain (CARDIA)    Difficulty of Paying Living Expenses: Not hard at all  Food Insecurity: No Food Insecurity (11/19/2020)   Hunger Vital Sign    Worried About Running Out of Food in the Last Year: Never true    Ran Out of Food in the Last Year: Never true  Transportation Needs: No Transportation Needs (11/19/2020)   PRAPARE - Hydrologist (Medical): No    Lack of Transportation (Non-Medical): No  Physical Activity: Inactive (11/19/2020)   Exercise Vital Sign    Days of  Exercise per Week: 0 days    Minutes of Exercise per Session: 0 min  Stress: Not on file  Social Connections: Not on file  Intimate Partner Violence: Not on file    Family History  Problem Relation Age of Onset   Diabetes Mother    Hypertension Mother    Heart attack Mother    Hyperlipidemia Mother    Heart disease Mother        heart problems; no AMI   Kidney disease Mother    Hyperlipidemia Sister    Hypertension Sister    Multiple sclerosis Sister    Mental illness Brother    Epilepsy Brother    Diabetes Sister    Sudden death Neg Hx      Current Outpatient Medications:    acetaminophen (TYLENOL) 500 MG tablet, Take by mouth., Disp: , Rfl:    albuterol (VENTOLIN HFA) 108 (90 Base) MCG/ACT inhaler, Inhale 2  puffs into the lungs every 4 (four) hours as needed for wheezing or shortness of breath., Disp: , Rfl:    amLODipine (NORVASC) 10 MG tablet, Take 1 tablet (10 mg total) by mouth daily., Disp: 180 tablet, Rfl: 3   ascorbic acid (VITAMIN C) 250 MG tablet, Take 1 tablet by mouth daily., Disp: , Rfl:    aspirin EC 81 MG tablet, Take 81 mg by mouth daily. Swallow whole., Disp: , Rfl:    benzonatate (TESSALON) 100 MG capsule, Take by mouth., Disp: , Rfl:    cholecalciferol (VITAMIN D3) 25 MCG (1000 UNIT) tablet, Take 1,000 Units by mouth daily., Disp: , Rfl:    CREON 36000-114000 units CPEP capsule, Take by mouth., Disp: , Rfl:    ezetimibe (ZETIA) 10 MG tablet, Take 1 tablet (10 mg total) by mouth daily., Disp: 90 tablet, Rfl: 3   famotidine (PEPCID) 20 MG tablet, Take 20 mg by mouth at bedtime., Disp: , Rfl:    ferrous sulfate 325 (65 FE) MG EC tablet, Take by mouth., Disp: , Rfl:    folic acid (FOLVITE) 1 MG tablet, Take 1 mg by mouth daily., Disp: , Rfl:    furosemide (LASIX) 20 MG tablet, Take 1 tablet (20 mg total) by mouth daily as needed., Disp: 30 tablet, Rfl: 2   hydrochlorothiazide (MICROZIDE) 12.5 MG capsule, Take by mouth., Disp: , Rfl:     HYDROcodone-acetaminophen (NORCO/VICODIN) 5-325 MG tablet, Take 1 tablet by mouth every 4 (four) hours as needed., Disp: 10 tablet, Rfl: 0   iron sucrose in sodium chloride 0.9 % 100 mL, Take 1 capsule by mouth daily., Disp: , Rfl:    methocarbamol (ROBAXIN) 500 MG tablet, Take by mouth., Disp: , Rfl:    nortriptyline (PAMELOR) 10 MG capsule, Take by mouth., Disp: , Rfl:    oxyCODONE (OXY IR/ROXICODONE) 5 MG immediate release tablet, Take 5-10 mg by mouth every 4 (four) hours as needed., Disp: , Rfl:    pantoprazole (PROTONIX) 40 MG tablet, Take 1 tablet (40 mg total) by mouth daily before breakfast., Disp: 90 tablet, Rfl: 3   rosuvastatin (CRESTOR) 20 MG tablet, Take 1 tablet (20 mg total) by mouth daily. (Patient not taking: Reported on 04/21/2022), Disp: 90 tablet, Rfl: 3   senna-docusate (SENOKOT-S) 8.6-50 MG tablet, Take by mouth. (Patient not taking: Reported on 04/21/2022), Disp: , Rfl:    telmisartan-hydrochlorothiazide (MICARDIS HCT) 80-12.5 MG tablet, Take 1 tablet by mouth daily. (Patient not taking: Reported on 04/21/2022), Disp: 90 tablet, Rfl: 0   vitamin B-12 (CYANOCOBALAMIN) 1000 MCG tablet, Take 1,000 mcg by mouth daily., Disp: , Rfl:   Current Facility-Administered Medications:    sodium chloride flush (NS) 0.9 % injection 3 mL, 3 mL, Intravenous, Q12H, Skeet Latch, MD  Physical exam: There were no vitals filed for this visit. Physical Exam Cardiovascular:     Rate and Rhythm: Normal rate and regular rhythm.     Heart sounds: Normal heart sounds.  Pulmonary:     Effort: Pulmonary effort is normal.     Breath sounds: Normal breath sounds.  Abdominal:     General: Bowel sounds are normal.     Palpations: Abdomen is soft.  Skin:    General: Skin is warm and dry.  Neurological:     Mental Status: She is alert and oriented to person, place, and time.     Comments: Patient noted to have tremors involving bilateral hands          Latest Ref Rng &  Units 04/08/2022     9:31 AM  CMP  Glucose 70 - 99 mg/dL 97   BUN 6 - 24 mg/dL 15   Creatinine 0.57 - 1.00 mg/dL 0.76   Sodium 134 - 144 mmol/L 141   Potassium 3.5 - 5.2 mmol/L 4.4   Chloride 96 - 106 mmol/L 102   CO2 20 - 29 mmol/L 22   Calcium 8.7 - 10.2 mg/dL 9.8       Latest Ref Rng & Units 05/04/2022   12:47 PM  CBC  WBC 4.0 - 10.5 K/uL 5.8   Hemoglobin 12.0 - 15.0 g/dL 12.7   Hematocrit 36.0 - 46.0 % 38.8   Platelets 150 - 400 K/uL 245     No images are attached to the encounter.  ECHOCARDIOGRAM COMPLETE  Result Date: 04/23/2022    ECHOCARDIOGRAM REPORT   Patient Name:   MIATTA MARAVILLA Date of Exam: 04/23/2022 Medical Rec #:  [id number]         Height:       63.5 in Accession #:    [id number]        Weight:       218.0 lb Date of Birth:  1960/11/15         BSA:          2.018 m Patient Age:    68 years          BP:           130/84 mmHg Patient Gender: F                 HR:           74 bpm. Exam Location:  [facility] Procedure: 2D Echo, Cardiac Doppler, Color Doppler and Strain Analysis Indications:    R07.9* Chest pain, unspecified; R06.02 SOB  History:        Patient has prior history of Echocardiogram examinations, most                 recent 06/23/2019. CAD, Signs/Symptoms:Chest Pain and Shortness                 of Breath; Risk Factors:Non-Smoker and Hypertension.  Sonographer:    Pilar Jarvis RDMS, RVT, RDCS Referring Phys: [id number] [place]  1. Left ventricular ejection fraction, by estimation, is 60 to 65%. The left ventricle has normal function. The left ventricle has no regional wall motion abnormalities. Left ventricular diastolic parameters were normal. The average left ventricular global longitudinal strain is -19.4 %.  2. Right ventricular systolic function is normal. The right ventricular size is normal.  3. The mitral valve is normal in structure. Mild mitral valve regurgitation. No evidence of mitral stenosis.  4. The aortic valve is tricuspid. Aortic valve regurgitation  is not visualized. No aortic stenosis is present.  5. The inferior vena cava is normal in size with greater than 50% respiratory variability, suggesting right atrial pressure of 3 mmHg. FINDINGS  Left Ventricle: Left ventricular ejection fraction, by estimation, is 60 to 65%. The left ventricle has normal function. The left ventricle has no regional wall motion abnormalities. The average left ventricular global longitudinal strain is -19.4 %. The left ventricular internal cavity size was normal in size. There is no left ventricular hypertrophy. Left ventricular diastolic parameters were normal. Right Ventricle: The right ventricular size is normal. No increase in right ventricular wall thickness. Right ventricular systolic function is normal. Left Atrium: Left atrial size was normal in size. Right Atrium:  Right atrial size was normal in size. Pericardium: There is no evidence of pericardial effusion. Mitral Valve: The mitral valve is normal in structure. Mild mitral valve regurgitation. No evidence of mitral valve stenosis. Tricuspid Valve: The tricuspid valve is normal in structure. Tricuspid valve regurgitation is mild . No evidence of tricuspid stenosis. Aortic Valve: The aortic valve is tricuspid. Aortic valve regurgitation is not visualized. No aortic stenosis is present. Aortic valve mean gradient measures 3.0 mmHg. Aortic valve peak gradient measures 6.1 mmHg. Aortic valve area, by VTI measures 2.07 cm. Pulmonic Valve: The pulmonic valve was normal in structure. Pulmonic valve regurgitation is not visualized. No evidence of pulmonic stenosis. Aorta: The aortic root is normal in size and structure. Venous: The inferior vena cava is normal in size with greater than 50% respiratory variability, suggesting right atrial pressure of 3 mmHg. IAS/Shunts: No atrial level shunt detected by color flow Doppler.  LEFT VENTRICLE PLAX 2D LVIDd:         5.20 cm      Diastology LVIDs:         3.40 cm      LV e' medial:     7.18 cm/s LV PW:         1.00 cm      LV E/e' medial:  15.0 LV IVS:        1.10 cm      LV e' lateral:   7.72 cm/s LVOT diam:     1.80 cm      LV E/e' lateral: 14.0 LV SV:         56 LV SV Index:   28           2D Longitudinal Strain LVOT Area:     2.54 cm     2D Strain GLS Avg:     -19.4 %  LV Volumes (MOD) LV vol d, MOD A2C: 108.0 ml LV vol d, MOD A4C: 95.5 ml LV vol s, MOD A2C: 55.5 ml LV vol s, MOD A4C: 42.4 ml LV SV MOD A2C:     52.5 ml LV SV MOD A4C:     95.5 ml LV SV MOD BP:      54.5 ml RIGHT VENTRICLE             IVC RV Basal diam:  4.10 cm     IVC diam: 2.50 cm RV Mid diam:    3.70 cm RV S prime:     12.50 cm/s TAPSE (M-mode): 2.6 cm LEFT ATRIUM             Index        RIGHT ATRIUM           Index LA diam:        4.00 cm 1.98 cm/m   RA Area:     19.20 cm LA Vol (A2C):   36.0 ml 17.84 ml/m  RA Volume:   56.80 ml  28.15 ml/m LA Vol (A4C):   31.8 ml 15.76 ml/m LA Biplane Vol: 35.0 ml 17.35 ml/m  AORTIC VALVE                    PULMONIC VALVE AV Area (Vmax):    1.99 cm     PV Vmax:       1.06 m/s AV Area (Vmean):   2.00 cm     PV Peak grad:  4.5 mmHg AV Area (VTI):     2.07 cm AV Vmax:  123.00 cm/s AV Vmean:          81.800 cm/s AV VTI:            0.272 m AV Peak Grad:      6.1 mmHg AV Mean Grad:      3.0 mmHg LVOT Vmax:         96.00 cm/s LVOT Vmean:        64.400 cm/s LVOT VTI:          0.221 m LVOT/AV VTI ratio: 0.81  AORTA Ao Root diam: 3.40 cm Ao Asc diam:  3.00 cm Ao Arch diam: 2.9 cm MITRAL VALVE                TRICUSPID VALVE MV Area (PHT): 4.80 cm     TR Peak grad:   29.2 mmHg MV Decel Time: 158 msec     TR Vmax:        270.00 cm/s MV E velocity: 108.00 cm/s MV A velocity: 84.00 cm/s   SHUNTS MV E/A ratio:  1.29         Systemic VTI:  0.22 m                             Systemic Diam: 1.80 cm Ida Rogue MD Electronically signed by Ida Rogue MD Signature Date/Time: 04/23/2022/9:12:20 PM    Final    CARDIAC CATHETERIZATION  Result Date: 04/13/2022 Conclusions: Mild,  non-obstructive atherosclerotic coronary artery disease.  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 Low normal left ventricular systolic function with subtle mid anterior hypokinesis (LVEF 50-55%). Mildly elevated left ventricular filling pressure (LVEDP 23 mmHg). Recommendations: Continue statin therapy and blood pressure control.  Recommend indefinite aspirin 81 mg daily in the setting of coronary artery ectasia/aneurysms. Proceed with echo to better assess LVEF. Consider escalation of diuresis/medical therapy of HFpEF. Nelva Bush, MD [facility]    Assessment and plan- Patient is a 62 y.o. female here for routine follow-up of normocytic anemia  Patient's hemoglobin which was close to 10 last year has improved presently to 12.7.White count and platelets are normal.  Iron studies and B12 from today are pending.  We will call her if she is in need of any IV iron.  Otherwise I will monitor her CBC in 6 months and 1 year and see her back in 1 year.  She did have a 0.2 IgG lambda M protein noted on her SPEP a year ago which I will follow-up in 6 months time  History of B12 deficiency: B12 levels from today are pending.  If they come back low patient is interested in getting monthly B12 injections at home.  We will let her know based on her results   Visit Diagnosis 1. Normocytic anemia      Dr. Randa Evens, MD, MPH [facility] [id number] 05/04/2022 1:07 PM

## 2022-05-05 ENCOUNTER — Ambulatory Visit (INDEPENDENT_AMBULATORY_CARE_PROVIDER_SITE_OTHER): Payer: Medicaid Other | Admitting: Vascular Surgery

## 2022-05-05 ENCOUNTER — Ambulatory Visit (INDEPENDENT_AMBULATORY_CARE_PROVIDER_SITE_OTHER): Payer: Medicaid Other

## 2022-05-05 ENCOUNTER — Encounter (INDEPENDENT_AMBULATORY_CARE_PROVIDER_SITE_OTHER): Payer: Self-pay | Admitting: Vascular Surgery

## 2022-05-05 VITALS — BP 163/95 | HR 62 | Resp 18 | Ht 63.5 in | Wt 220.0 lb

## 2022-05-05 DIAGNOSIS — I10 Essential (primary) hypertension: Secondary | ICD-10-CM | POA: Diagnosis not present

## 2022-05-05 DIAGNOSIS — I872 Venous insufficiency (chronic) (peripheral): Secondary | ICD-10-CM | POA: Diagnosis not present

## 2022-05-05 DIAGNOSIS — E785 Hyperlipidemia, unspecified: Secondary | ICD-10-CM

## 2022-05-05 DIAGNOSIS — M79605 Pain in left leg: Secondary | ICD-10-CM

## 2022-05-05 DIAGNOSIS — I7121 Aneurysm of the ascending aorta, without rupture: Secondary | ICD-10-CM | POA: Diagnosis not present

## 2022-05-05 DIAGNOSIS — M79604 Pain in right leg: Secondary | ICD-10-CM

## 2022-05-05 NOTE — Assessment & Plan Note (Signed)
Noninvasive studies today showed normal ABIs of 1.11 on the right and 1.08 on the left with triphasic waveforms.  Normal digital pressures and waveforms.  Venous reflux study shows no evidence of DVT, superficial thrombophlebitis, or significant venous reflux in either lower extremity. It does not appear as if her lower extremity symptoms are a vascular origin.  No further vascular workup is planned at this time for her lower extremities.

## 2022-05-05 NOTE — Progress Notes (Signed)
MRN : [id number]  Audrey Farley is a 62 y.o. ([birth year]) female who presents with chief complaint of  Chief Complaint  Patient presents with   Follow-up    f/u with abi and b.l. reflux  .  History of Present Illness: Patient returns today in follow up of lower extremity symptoms.  No major changes since her last visit.  No new ulceration or infection. Noninvasive studies today showed normal ABIs of 1.11 on the right and 1.08 on the left with triphasic waveforms.  Normal digital pressures and waveforms.  Venous reflux study shows no evidence of DVT, superficial thrombophlebitis, or significant venous reflux in either lower extremity.  Current Outpatient Medications  Medication Sig Dispense Refill   acetaminophen (TYLENOL) 500 MG tablet Take by mouth.     acetaminophen (TYLENOL) 500 MG tablet Take by mouth.     albuterol (VENTOLIN HFA) 108 (90 Base) MCG/ACT inhaler Inhale 2 puffs into the lungs every 4 (four) hours as needed for wheezing or shortness of breath.     amLODipine (NORVASC) 10 MG tablet Take 1 tablet (10 mg total) by mouth daily. 180 tablet 3   ascorbic acid (VITAMIN C) 250 MG tablet Take 1 tablet by mouth daily.     aspirin EC 81 MG tablet Take 81 mg by mouth daily. Swallow whole.     atorvastatin (LIPITOR) 20 MG tablet Take by mouth.     benzonatate (TESSALON) 100 MG capsule Take by mouth.     celecoxib (CELEBREX) 200 MG capsule Take by mouth.     cholecalciferol (VITAMIN D3) 25 MCG (1000 UNIT) tablet Take 1,000 Units by mouth daily.     CREON 36000-114000 units CPEP capsule Take by mouth.     diltiazem (CARDIZEM CD) 120 MG 24 hr capsule Take by mouth.     ezetimibe (ZETIA) 10 MG tablet Take 1 tablet (10 mg total) by mouth daily. 90 tablet 3   famotidine (PEPCID) 20 MG tablet Take 20 mg by mouth at bedtime.     ferrous sulfate 325 (65 FE) MG EC tablet Take by mouth.     folic acid (FOLVITE) 1 MG tablet Take 1 mg by mouth daily.     hydrochlorothiazide (MICROZIDE)  12.5 MG capsule Take by mouth.     HYDROcodone-acetaminophen (NORCO/VICODIN) 5-325 MG tablet Take 1 tablet by mouth every 4 (four) hours as needed. 10 tablet 0   iron sucrose in sodium chloride 0.9 % 100 mL Take 1 capsule by mouth daily.     methocarbamol (ROBAXIN) 500 MG tablet Take by mouth.     nortriptyline (PAMELOR) 10 MG capsule Take by mouth.     oxyCODONE (OXY IR/ROXICODONE) 5 MG immediate release tablet Take 5-10 mg by mouth every 4 (four) hours as needed.     pantoprazole (PROTONIX) 40 MG tablet Take 1 tablet (40 mg total) by mouth daily before breakfast. 90 tablet 3   pregabalin (LYRICA) 25 MG capsule Take 25 mg twice a day for a week then increase to 50 mg twice a day and continue that dose     rosuvastatin (CRESTOR) 20 MG tablet Take 1 tablet (20 mg total) by mouth daily. 90 tablet 3   senna-docusate (SENOKOT-S) 8.6-50 MG tablet Take by mouth.     telmisartan-hydrochlorothiazide (MICARDIS HCT) 80-12.5 MG tablet Take 1 tablet by mouth daily. 90 tablet 0   vitamin B-12 (CYANOCOBALAMIN) 1000 MCG tablet Take 1,000 mcg by mouth daily.     furosemide (LASIX) 20 MG tablet Take  1 tablet (20 mg total) by mouth daily as needed. 30 tablet 2   Current Facility-Administered Medications  Medication Dose Route Frequency Provider Last Rate Last Admin   sodium chloride flush (NS) 0.9 % injection 3 mL  3 mL Intravenous Q12H Skeet Latch, MD        Past Medical History:  Diagnosis Date   Allergy    Aneurysm of ascending aorta ([place]) 02/25/2016   3.9cm by echo 02/2016  -aorta enlarged   Arthritis    CAD in native artery 11/19/2020   Chest pain 01/24/2016   Dyspnea on exertion    HLD (hyperlipidemia)    Hypertension    Urge incontinence     Past Surgical History:  Procedure Laterality Date   ABDOMINAL HYSTERECTOMY     still has cervix; DUB; ovaries intact.   COLONOSCOPY WITH PROPOFOL N/A 11/03/2021   Procedure: COLONOSCOPY WITH PROPOFOL;  Surgeon: Lin Landsman, MD;  Location:  [facility] ENDOSCOPY;  Service: Gastroenterology;  Laterality: N/A;   ESOPHAGOGASTRODUODENOSCOPY (EGD) WITH PROPOFOL N/A 11/03/2021   Procedure: ESOPHAGOGASTRODUODENOSCOPY (EGD) WITH PROPOFOL;  Surgeon: Lin Landsman, MD;  Location: [facility] ENDOSCOPY;  Service: Gastroenterology;  Laterality: N/A;   KNEE ARTHROSCOPY Left 2012   LEFT HEART CATH AND CORONARY ANGIOGRAPHY N/A 04/13/2022   Procedure: LEFT HEART CATH AND CORONARY ANGIOGRAPHY;  Surgeon: Nelva Bush, MD;  Location: [place] CV LAB;  Service: Cardiovascular;  Laterality: N/A;   TOTAL KNEE ARTHROPLASTY Left 09/28/2016   TOTAL KNEE ARTHROPLASTY Left 09/28/2016   Procedure: LEFT TOTAL KNEE ARTHROPLASTY;  Surgeon: Vickey Huger, MD;  Location: [facility];  Service: Orthopedics;  Laterality: Left;     Social History   Tobacco Use   Smoking status: Never   Smokeless tobacco: Never  Vaping Use   Vaping Use: Never used  Substance Use Topics   Alcohol use: Yes    Comment: occ vodka.NONE LAST 24HRS   Drug use: No      Family History  Problem Relation Age of Onset   Diabetes Mother    Hypertension Mother    Heart attack Mother    Hyperlipidemia Mother    Heart disease Mother        heart problems; no AMI   Kidney disease Mother    Hyperlipidemia Sister    Hypertension Sister    Multiple sclerosis Sister    Mental illness Brother    Epilepsy Brother    Diabetes Sister    Sudden death Neg Hx      Allergies  Allergen Reactions   Celecoxib Nausea Only   Mirabegron Nausea Only   Latex Itching     REVIEW OF SYSTEMS (Negative unless checked)   Constitutional: '[]'$ Weight loss  '[]'$ Fever  '[]'$ Chills Cardiac: '[x]'$ Chest pain   '[]'$ Chest pressure   '[]'$ Palpitations   '[]'$ Shortness of breath when laying flat   '[]'$ Shortness of breath at rest   '[]'$ Shortness of breath with exertion. Vascular:  '[]'$ Pain in legs with walking   '[]'$ Pain in legs at rest   '[]'$ Pain in legs when laying flat   '[]'$ Claudication   '[]'$ Pain in feet when walking  '[]'$ Pain in feet at rest   '[]'$ Pain in feet when laying flat   '[]'$ History of DVT   '[]'$ Phlebitis   '[x]'$ Swelling in legs   '[]'$ Varicose veins   '[]'$ Non-healing ulcers Pulmonary:   '[]'$ Uses home oxygen   '[]'$ Productive cough   '[]'$ Hemoptysis   '[]'$ Wheeze  '[]'$ COPD   '[]'$ Asthma Neurologic:  '[]'$ Dizziness  '[]'$ Blackouts   '[]'$ Seizures   '[]'$ History of stroke   '[]'$   History of TIA  '[]'$ Aphasia   '[]'$ Temporary blindness   '[]'$ Dysphagia   '[]'$ Weakness or numbness in arms   '[]'$ Weakness or numbness in legs Musculoskeletal:  '[x]'$ Arthritis   '[]'$ Joint swelling   '[x]'$ Joint pain   '[]'$ Low back pain Hematologic:  '[]'$ Easy bruising  '[]'$ Easy bleeding   '[]'$ Hypercoagulable state   '[]'$ Anemic  '[]'$ Hepatitis Gastrointestinal:  '[]'$ Blood in stool   '[]'$ Vomiting blood  '[]'$ Gastroesophageal reflux/heartburn   '[]'$ Abdominal pain Genitourinary:  '[]'$ Chronic kidney disease   '[]'$ Difficult urination  '[]'$ Frequent urination  '[]'$ Burning with urination   '[]'$ Hematuria Skin:  '[]'$ Rashes   '[]'$ Ulcers   '[]'$ Wounds Psychological:  '[]'$ History of anxiety   '[]'$  History of major depression.  Physical Examination  BP (!) 163/95 (BP Location: Right Arm)   Pulse 62   Resp 18   Ht 5' 3.5" (1.613 m)   Wt 220 lb (99.8 kg)   BMI 38.36 kg/m  Gen:  WD/WN, NAD Head: [place]/AT, No temporalis wasting. Ear/Nose/Throat: Hearing grossly intact, nares w/o erythema or drainage Eyes: Conjunctiva clear. Sclera non-icteric Neck: Supple.  Trachea midline Pulmonary:  Good air movement, no use of accessory muscles.  Cardiac: RRR, no JVD Vascular:  Vessel Right Left  Radial Palpable Palpable                          PT Palpable Palpable  DP Palpable Palpable   Gastrointestinal: soft, non-tender/non-distended. No guarding/reflex.  Musculoskeletal: M/S 5/5 throughout.  No deformity or atrophy. No significant LE edema. Neurologic: Sensation grossly intact in extremities.  Symmetrical.  Speech is fluent.  Psychiatric: Judgment intact, Mood & affect appropriate for pt's clinical situation. Dermatologic: No rashes or ulcers noted.  No cellulitis or  open wounds.      Labs Recent Results (from the past 2160 hour(s))  CBC with Differential/Platelet     Status: None   Collection Time: 04/08/22  9:31 AM  Result Value Ref Range   WBC 6.4 3.4 - 10.8 x10E3/uL   RBC 4.55 3.77 - 5.28 x10E6/uL   Hemoglobin 12.9 11.1 - 15.9 g/dL   Hematocrit 39.0 34.0 - 46.6 %   MCV 86 79 - 97 fL   MCH 28.4 26.6 - 33.0 pg   MCHC 33.1 31.5 - 35.7 g/dL   RDW 12.6 11.7 - 15.4 %   Platelets 229 150 - 450 x10E3/uL   Neutrophils 60 Not Estab. %   Lymphs 30 Not Estab. %   Monocytes 7 Not Estab. %   Eos 2 Not Estab. %   Basos 1 Not Estab. %   Neutrophils Absolute 3.8 1.4 - 7.0 x10E3/uL   Lymphocytes Absolute 1.9 0.7 - 3.1 x10E3/uL   Monocytes Absolute 0.5 0.1 - 0.9 x10E3/uL   EOS (ABSOLUTE) 0.1 0.0 - 0.4 x10E3/uL   Basophils Absolute 0.0 0.0 - 0.2 x10E3/uL   Immature Granulocytes 0 Not Estab. %   Immature Grans (Abs) 0.0 0.0 - 0.1 [id number]  Basic metabolic panel     Status: None   Collection Time: 04/08/22  9:31 AM  Result Value Ref Range   Glucose 97 70 - 99 mg/dL   BUN 15 6 - 24 mg/dL   Creatinine, Ser 0.76 0.57 - 1.00 mg/dL   eGFR 90 >59 mL/min/1.73   BUN/Creatinine Ratio 20 9 - 23   Sodium 141 134 - 144 mmol/L   Potassium 4.4 3.5 - 5.2 mmol/L   Chloride 102 96 - 106 mmol/L   CO2 22 20 - 29 mmol/L   Calcium 9.8 8.7 -  10.2 mg/dL  ECHOCARDIOGRAM COMPLETE     Status: None   Collection Time: 04/23/22  9:07 AM  Result Value Ref Range   AR max vel 1.99 cm2   AV Peak grad 6.1 mmHg   Ao pk vel 1.23 m/s   S' Lateral 3.40 cm   Area-P 1/2 4.80 cm2   AV Area VTI 2.07 cm2   AV Mean grad 3.0 mmHg   Single Plane A4C EF 55.6 %   Single Plane A2C EF 48.6 %   Calc EF 52.9 %   AV Area mean vel 2.00 cm2   Est EF 60 - 65%   Vitamin B12     Status: None   Collection Time: 05/04/22 12:46 PM  Result Value Ref Range   Vitamin B-12 288 180 - 914 pg/mL    Comment: (NOTE) This assay is not validated for testing neonatal or myeloproliferative syndrome  specimens for Vitamin B12 levels. Performed at [facility], [place] [address] [place], [place] [address]   Iron and TIBC     Status: None   Collection Time: 05/04/22 12:47 PM  Result Value Ref Range   Iron 83 28 - 170 ug/dL   TIBC 332 250 - 450 ug/dL   Saturation Ratios 25 10.4 - 31.8 %   UIBC 249 ug/dL    Comment: Performed at [facility], [address] [place], [place] [address]  Ferritin     Status: None   Collection Time: 05/04/22 12:47 PM  Result Value Ref Range   Ferritin 259 11 - 307 ng/mL    Comment: Performed at [facility], [address] [place], [place] [address]  CBC with Differential/Platelet     Status: None   Collection Time: 05/04/22 12:47 PM  Result Value Ref Range   WBC 5.8 4.0 - 10.5 K/uL   RBC 4.45 3.87 - 5.11 MIL/uL   Hemoglobin 12.7 12.0 - 15.0 g/dL   HCT 38.8 36.0 - 46.0 %   MCV 87.2 80.0 - 100.0 fL   MCH 28.5 26.0 - 34.0 pg   MCHC 32.7 30.0 - 36.0 g/dL   RDW 12.6 11.5 - 15.5 %   Platelets 245 150 - 400 K/uL   nRBC 0.0 0.0 - 0.2 %   Neutrophils Relative % 44 %   Neutro Abs 2.6 1.7 - 7.7 K/uL   Lymphocytes Relative 44 %   Lymphs Abs 2.5 0.7 - 4.0 K/uL   Monocytes Relative 7 %   Monocytes Absolute 0.4 0.1 - 1.0 K/uL   Eosinophils Relative 4 %   Eosinophils Absolute 0.2 0.0 - 0.5 K/uL   Basophils Relative 1 %   Basophils Absolute 0.0 0.0 - 0.1 K/uL   Immature Granulocytes 0 %   Abs Immature Granulocytes 0.01 0.00 - 0.07 K/uL    Comment: Performed at [facility], [address], [place],  [id number]    Radiology ECHOCARDIOGRAM COMPLETE  Result Date: 04/23/2022    ECHOCARDIOGRAM REPORT   Patient Name:   SUNI AMEND Date of Exam: 04/23/2022 Medical Rec #:  [id number]         Height:       63.5 in Accession #:    [id number]        Weight:       218.0 lb Date of Birth:  1960/10/23         BSA:          2.018 m Patient Age:    52 years  BP:           130/84 mmHg Patient Gender: F                 HR:            74 bpm. Exam Location:  [place] Procedure: 2D Echo, Cardiac Doppler, Color Doppler and Strain Analysis Indications:    R07.9* Chest pain, unspecified; R06.02 SOB  History:        Patient has prior history of Echocardiogram examinations, most                 recent 06/23/2019. CAD, Signs/Symptoms:Chest Pain and Shortness                 of Breath; Risk Factors:Non-Smoker and Hypertension.  Sonographer:    Pilar Jarvis RDMS, RVT, RDCS Referring Phys: [id number] [place]  1. Left ventricular ejection fraction, by estimation, is 60 to 65%. The left ventricle has normal function. The left ventricle has no regional wall motion abnormalities. Left ventricular diastolic parameters were normal. The average left ventricular global longitudinal strain is -19.4 %.  2. Right ventricular systolic function is normal. The right ventricular size is normal.  3. The mitral valve is normal in structure. Mild mitral valve regurgitation. No evidence of mitral stenosis.  4. The aortic valve is tricuspid. Aortic valve regurgitation is not visualized. No aortic stenosis is present.  5. The inferior vena cava is normal in size with greater than 50% respiratory variability, suggesting right atrial pressure of 3 mmHg. FINDINGS  Left Ventricle: Left ventricular ejection fraction, by estimation, is 60 to 65%. The left ventricle has normal function. The left ventricle has no regional wall motion abnormalities. The average left ventricular global longitudinal strain is -19.4 %. The left ventricular internal cavity size was normal in size. There is no left ventricular hypertrophy. Left ventricular diastolic parameters were normal. Right Ventricle: The right ventricular size is normal. No increase in right ventricular wall thickness. Right ventricular systolic function is normal. Left Atrium: Left atrial size was normal in size. Right Atrium: Right atrial size was normal in size. Pericardium: There is no evidence of  pericardial effusion. Mitral Valve: The mitral valve is normal in structure. Mild mitral valve regurgitation. No evidence of mitral valve stenosis. Tricuspid Valve: The tricuspid valve is normal in structure. Tricuspid valve regurgitation is mild . No evidence of tricuspid stenosis. Aortic Valve: The aortic valve is tricuspid. Aortic valve regurgitation is not visualized. No aortic stenosis is present. Aortic valve mean gradient measures 3.0 mmHg. Aortic valve peak gradient measures 6.1 mmHg. Aortic valve area, by VTI measures 2.07 cm. Pulmonic Valve: The pulmonic valve was normal in structure. Pulmonic valve regurgitation is not visualized. No evidence of pulmonic stenosis. Aorta: The aortic root is normal in size and structure. Venous: The inferior vena cava is normal in size with greater than 50% respiratory variability, suggesting right atrial pressure of 3 mmHg. IAS/Shunts: No atrial level shunt detected by color flow Doppler.  LEFT VENTRICLE PLAX 2D LVIDd:         5.20 cm      Diastology LVIDs:         3.40 cm      LV e' medial:    7.18 cm/s LV PW:         1.00 cm      LV E/e' medial:  15.0 LV IVS:        1.10 cm      LV  e' lateral:   7.72 cm/s LVOT diam:     1.80 cm      LV E/e' lateral: 14.0 LV SV:         56 LV SV Index:   28           2D Longitudinal Strain LVOT Area:     2.54 cm     2D Strain GLS Avg:     -19.4 %  LV Volumes (MOD) LV vol d, MOD A2C: 108.0 ml LV vol d, MOD A4C: 95.5 ml LV vol s, MOD A2C: 55.5 ml LV vol s, MOD A4C: 42.4 ml LV SV MOD A2C:     52.5 ml LV SV MOD A4C:     95.5 ml LV SV MOD BP:      54.5 ml RIGHT VENTRICLE             IVC RV Basal diam:  4.10 cm     IVC diam: 2.50 cm RV Mid diam:    3.70 cm RV S prime:     12.50 cm/s TAPSE (M-mode): 2.6 cm LEFT ATRIUM             Index        RIGHT ATRIUM           Index LA diam:        4.00 cm 1.98 cm/m   RA Area:     19.20 cm LA Vol (A2C):   36.0 ml 17.84 ml/m  RA Volume:   56.80 ml  28.15 ml/m LA Vol (A4C):   31.8 ml 15.76 ml/m LA  Biplane Vol: 35.0 ml 17.35 ml/m  AORTIC VALVE                    PULMONIC VALVE AV Area (Vmax):    1.99 cm     PV Vmax:       1.06 m/s AV Area (Vmean):   2.00 cm     PV Peak grad:  4.5 mmHg AV Area (VTI):     2.07 cm AV Vmax:           123.00 cm/s AV Vmean:          81.800 cm/s AV VTI:            0.272 m AV Peak Grad:      6.1 mmHg AV Mean Grad:      3.0 mmHg LVOT Vmax:         96.00 cm/s LVOT Vmean:        64.400 cm/s LVOT VTI:          0.221 m LVOT/AV VTI ratio: 0.81  AORTA Ao Root diam: 3.40 cm Ao Asc diam:  3.00 cm Ao Arch diam: 2.9 cm MITRAL VALVE                TRICUSPID VALVE MV Area (PHT): 4.80 cm     TR Peak grad:   29.2 mmHg MV Decel Time: 158 msec     TR Vmax:        270.00 cm/s MV E velocity: 108.00 cm/s MV A velocity: 84.00 cm/s   SHUNTS MV E/A ratio:  1.29         Systemic VTI:  0.22 m                             Systemic Diam: 1.80 cm Ida Rogue MD Electronically signed by Ida Rogue  MD Signature Date/Time: 04/23/2022/9:12:20 PM    Final    CARDIAC CATHETERIZATION  Result Date: 04/13/2022 Conclusions: Mild, non-obstructive atherosclerotic coronary artery disease.  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 Low normal left ventricular systolic function with subtle mid anterior hypokinesis (LVEF 50-55%). Mildly elevated left ventricular filling pressure (LVEDP 23 mmHg). Recommendations: Continue statin therapy and blood pressure control.  Recommend indefinite aspirin 81 mg daily in the setting of coronary artery ectasia/aneurysms. Proceed with echo to better assess LVEF. Consider escalation of diuresis/medical therapy of HFpEF. Nelva Bush, MD [facility]   Assessment/Plan Essential hypertension blood pressure control important in reducing the progression of atherosclerotic disease. On appropriate oral medications.     Hyperlipidemia lipid control important in reducing the progression of atherosclerotic  disease. Continue statin therapy  Pain in limb Noninvasive studies today showed normal ABIs of 1.11 on the right and 1.08 on the left with triphasic waveforms.  Normal digital pressures and waveforms.  Venous reflux study shows no evidence of DVT, superficial thrombophlebitis, or significant venous reflux in either lower extremity. It does not appear as if her lower extremity symptoms are a vascular origin.  No further vascular workup is planned at this time for her lower extremities.    Leotis Pain, MD  05/05/2022 4:39 PM    This note was created with Dragon medical transcription system.  Any errors from dictation are purely unintentional

## 2022-05-06 LAB — VAS US ABI WITH/WO TBI
Left ABI: 1.08
Right ABI: 1.11

## 2022-05-07 ENCOUNTER — Encounter (HOSPITAL_BASED_OUTPATIENT_CLINIC_OR_DEPARTMENT_OTHER): Payer: Self-pay | Admitting: Family

## 2022-05-07 ENCOUNTER — Ambulatory Visit (INDEPENDENT_AMBULATORY_CARE_PROVIDER_SITE_OTHER): Payer: Medicaid Other | Admitting: Family

## 2022-05-07 VITALS — BP 151/94 | HR 64 | Ht 63.5 in | Wt 219.0 lb

## 2022-05-07 DIAGNOSIS — I1A0 Resistant hypertension: Secondary | ICD-10-CM | POA: Diagnosis not present

## 2022-05-07 DIAGNOSIS — R0683 Snoring: Secondary | ICD-10-CM | POA: Diagnosis not present

## 2022-05-07 DIAGNOSIS — Z006 Encounter for examination for normal comparison and control in clinical research program: Secondary | ICD-10-CM

## 2022-05-07 DIAGNOSIS — G4730 Sleep apnea, unspecified: Secondary | ICD-10-CM

## 2022-05-07 MED ORDER — AMLODIPINE-VALSARTAN-HCTZ 10-320-25 MG PO TABS: 1.0000 | ORAL_TABLET | Freq: Every day | ORAL | 6 refills | Status: DC

## 2022-05-07 NOTE — Research (Signed)
  Subject Name: Audrey Farley met inclusion and exclusion criteria for the Virtual Care and Social Determinant Interventions for the management of hypertension trial.  The informed consent form, study requirements and expectations were reviewed with the subject by Dr. Oval Linsey and myself. The subject was given the opportunity to read the consent and ask questions. The subject verbalized understanding of the trial requirements.  All questions were addressed prior to the signing of the consent form. The subject agreed to participate in the trial and signed the informed consent. The informed consent was obtained prior to performance of any protocol-specific procedures for the subject.  A copy of the signed informed consent was given to the subject and a copy was placed in the subject's medical record.  Natishia Gelber was randomized to Group 2.

## 2022-05-07 NOTE — Patient Instructions (Addendum)
Medication Instructions:  Your physician has recommended you make the following change in your medication:   Stop: Amlodipine   Stop: HCTZ   Stop: Telmisartan- HCTZ   _____________________________  Start: Amlodipine- Valsartan- HCTZ 10-320-'25mg'$  daily    Labwork: Please return for Lab work in 1-2 weeks for BMP, Catecholamines, Metanephrines, cortisol and TSH. You may come to the...   [facility] (3rd floor) [address], [place], [place] [address]  Open: 8am-Noon and 1pm-4:30pm  Please ring the doorbell on the small table when you exit the elevator and the Lab Tech will come get you  [facility] [address] [place], [place],  [address] Open: 8am-1pm, then 2pm-4:30pm   [place]- Please see attached locations sheet stapled to your lab work with address and hours.     Testing/Procedures: Your physician has requested that you have a renal artery duplex. During this test, an ultrasound is used to evaluate blood flow to the kidneys. Allow one hour for this exam. Do not eat after midnight the day before and avoid carbonated beverages. Take your medications as you usually do.  Your physician has recommended that you have a sleep study. This test records several body functions during sleep, including: brain activity, eye movement, oxygen and carbon dioxide blood levels, heart rate and rhythm, breathing rate and rhythm, the flow of air through your mouth and nose, snoring, body muscle movements, and chest and belly movement.    Follow-Up: 2 month follow up: 4/18 AT 9:15AM WITH Laurann Montana, NP  4 month follow up:  6/24 AT 915AM WITH DR. Ladera   Special Instructions:  DASH Eating Plan DASH stands for Dietary Approaches to Stop Hypertension. The DASH eating plan is a healthy eating plan that has been shown to: Reduce high blood pressure (hypertension). Reduce your risk for type 2 diabetes, heart disease, and  stroke. Help with weight loss. What are tips for following this plan? Reading food labels Check food labels for the amount of salt (sodium) per serving. Choose foods with less than 5 percent of the Daily Value of sodium. Generally, foods with less than 300 milligrams (mg) of sodium per serving fit into this eating plan. To find whole grains, look for the word "whole" as the first word in the ingredient list. Shopping Buy products labeled as "low-sodium" or "no salt added." Buy fresh foods. Avoid canned foods and pre-made or frozen meals. Cooking Avoid adding salt when cooking. Use salt-free seasonings or herbs instead of table salt or sea salt. Check with your health care provider or pharmacist before using salt substitutes. Do not fry foods. Cook foods using healthy methods such as baking, boiling, grilling, roasting, and broiling instead. Cook with heart-healthy oils, such as olive, canola, avocado, soybean, or sunflower oil. Meal planning  Eat a balanced diet that includes: 4 or more servings of fruits and 4 or more servings of vegetables each day. Try to fill one-half of your plate with fruits and vegetables. 6-8 servings of whole grains each day. Less than 6 oz (170 g) of lean meat, poultry, or fish each day. A 3-oz (85-g) serving of meat is about the same size as a deck of cards. One egg equals 1 oz (28 g). 2-3 servings of low-fat dairy each day. One serving is 1 cup (237 mL). 1 serving of nuts, seeds, or beans 5 times each week. 2-3 servings of heart-healthy fats. Healthy fats called omega-3 fatty acids are found in foods such as  walnuts, flaxseeds, fortified milks, and eggs. These fats are also found in cold-water fish, such as sardines, salmon, and mackerel. Limit how much you eat of: Canned or prepackaged foods. Food that is high in trans fat, such as some fried foods. Food that is high in saturated fat, such as fatty meat. Desserts and other sweets, sugary drinks, and other foods  with added sugar. Full-fat dairy products. Do not salt foods before eating. Do not eat more than 4 egg yolks a week. Try to eat at least 2 vegetarian meals a week. Eat more home-cooked food and less restaurant, buffet, and fast food. Lifestyle When eating at a restaurant, ask that your food be prepared with less salt or no salt, if possible. If you drink alcohol: Limit how much you use to: 0-1 drink a day for women who are not pregnant. 0-2 drinks a day for men. Be aware of how much alcohol is in your drink. In the [place] one drink equals one 12 oz bottle of beer (355 mL), one 5 oz glass of wine (148 mL), or one 1 oz glass of hard liquor (44 mL). General information Avoid eating more than 2,300 mg of salt a day. If you have hypertension, you may need to reduce your sodium intake to 1,500 mg a day. Work with your health care provider to maintain a healthy body weight or to lose weight. Ask what an ideal weight is for you. Get at least 30 minutes of exercise that causes your heart to beat faster (aerobic exercise) most days of the week. Activities may include walking, swimming, or biking. Work with your health care provider or dietitian to adjust your eating plan to your individual calorie needs. What foods should I eat? Fruits All fresh, dried, or frozen fruit. Canned fruit in natural juice (without added sugar). Vegetables Fresh or frozen vegetables (raw, steamed, roasted, or grilled). Low-sodium or reduced-sodium tomato and vegetable juice. Low-sodium or reduced-sodium tomato sauce and tomato paste. Low-sodium or reduced-sodium canned vegetables. Grains Whole-grain or whole-wheat bread. Whole-grain or whole-wheat pasta. Brown rice. [place]. Bulgur. Whole-grain and low-sodium cereals. Pita bread. Low-fat, low-sodium crackers. Whole-wheat flour tortillas. Meats and other proteins Skinless chicken or [place]. Ground chicken or [place]. Pork with fat trimmed off. Fish and seafood. Egg  whites. Dried beans, peas, or lentils. Unsalted nuts, nut butters, and seeds. Unsalted canned beans. Lean cuts of beef with fat trimmed off. Low-sodium, lean precooked or cured meat, such as sausages or meat loaves. Dairy Low-fat (1%) or fat-free (skim) milk. Reduced-fat, low-fat, or fat-free cheeses. Nonfat, low-sodium ricotta or cottage cheese. Low-fat or nonfat yogurt. Low-fat, low-sodium cheese. Fats and oils Soft margarine without trans fats. Vegetable oil. Reduced-fat, low-fat, or light mayonnaise and salad dressings (reduced-sodium). Canola, safflower, olive, avocado, soybean, and sunflower oils. Avocado. Seasonings and condiments Herbs. Spices. Seasoning mixes without salt. Other foods Unsalted popcorn and pretzels. Fat-free sweets. The items listed above may not be a complete list of foods and beverages you can eat. Contact a dietitian for more information. What foods should I avoid? Fruits Canned fruit in a light or heavy syrup. Fried fruit. Fruit in cream or butter sauce. Vegetables Creamed or fried vegetables. Vegetables in a cheese sauce. Regular canned vegetables (not low-sodium or reduced-sodium). Regular canned tomato sauce and paste (not low-sodium or reduced-sodium). Regular tomato and vegetable juice (not low-sodium or reduced-sodium). Angie Fava. Olives. Grains Baked goods made with fat, such as croissants, muffins, or some breads. Dry pasta or rice meal packs. Meats and  other proteins Fatty cuts of meat. Ribs. Fried meat. Berniece Salines. Bologna, salami, and other precooked or cured meats, such as sausages or meat loaves. Fat from the back of a pig (fatback). Bratwurst. Salted nuts and seeds. Canned beans with added salt. Canned or smoked fish. Whole eggs or egg yolks. Chicken or [place] with skin. Dairy Whole or 2% milk, cream, and half-and-half. Whole or full-fat cream cheese. Whole-fat or sweetened yogurt. Full-fat cheese. Nondairy creamers. Whipped toppings. Processed cheese and  cheese spreads. Fats and oils Butter. Stick margarine. Lard. Shortening. Ghee. Bacon fat. Tropical oils, such as coconut, palm kernel, or palm oil. Seasonings and condiments Onion salt, garlic salt, seasoned salt, table salt, and sea salt. Worcestershire sauce. Tartar sauce. Barbecue sauce. Teriyaki sauce. Soy sauce, including reduced-sodium. Steak sauce. Canned and packaged gravies. Fish sauce. Oyster sauce. Cocktail sauce. Store-bought horseradish. Ketchup. Mustard. Meat flavorings and tenderizers. Bouillon cubes. Hot sauces. Pre-made or packaged marinades. Pre-made or packaged taco seasonings. Relishes. Regular salad dressings. Other foods Salted popcorn and pretzels. The items listed above may not be a complete list of foods and beverages you should avoid. Contact a dietitian for more information. Where to find more information National Heart, Lung, and Blood Institute: https://Jamesyn Lindell-eaton.com/ American Heart Association: www.heart.org Academy of Nutrition and Dietetics: www.eatright.[place]: www.kidney.org Summary The DASH eating plan is a healthy eating plan that has been shown to reduce high blood pressure (hypertension). It may also reduce your risk for type 2 diabetes, heart disease, and stroke. When on the DASH eating plan, aim to eat more fresh fruits and vegetables, whole grains, lean proteins, low-fat dairy, and heart-healthy fats. With the DASH eating plan, you should limit salt (sodium) intake to 2,300 mg a day. If you have hypertension, you may need to reduce your sodium intake to 1,500 mg a day. Work with your health care provider or dietitian to adjust your eating plan to your individual calorie needs. This information is not intended to replace advice given to you by your health care provider. Make sure you discuss any questions you have with your health care provider. Document Revised: 01/27/2019 Document Reviewed: 01/27/2019 Elsevier Patient Education  [place].

## 2022-05-07 NOTE — Progress Notes (Signed)
Advanced Hypertension Clinic Initial Assessment:    Date:  05/07/2022   ID:  Audrey Farley, DOB 08/04/1960, MRN [id number]  PCP:  Gae Bon, NP  Cardiologist:  Skeet Latch, MD  Nephrologist:  Referring MD: Gae Bon, NP   CC: Hypertension  History of Present Illness:    Audrey Farley is a 62 y.o. female with a hx of HTN, GERD, HLD, nonobstructive CAD.   Exercise Myoview in 2017 with EF 53% with no ischemia.  She achieved 8 METS on Bruce protocol.  Echo 02/2016 EF 55 to 60%, mild dilation ascending aorta 3.9 cm.  Stress test 06/2019 EF 70% with no ischemia.  She was started on a diltiazem due to elevated blood pressure.  Repeat echo 06/2019 LVEF 60 to 65%, mild LVH, diastolic function indeterminate, ascending aorta normal in size.   Seen 08/2020 for acute visit for chest pain.  Coronary CTA with minimal nonobstructive disease.  Calcium score placing her in the 96 percentile.  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  Underwent repeat knee surgery which was performed 06/2021.  Seen 04/08/2022 and due to persistent chest pain recommended for CAD.  Diltiazem was transitioned to amlodipine for improved blood pressure control.  She was referred to the advanced hypertension clinic for evaluation of secondary causes. LHC 04/13/22 mild nonobstructive coronary disease.  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  Echo 04/23/2022 to assess LVEF revealed LVEF 60 to 65%, no RWMA, mild MR.  She presents today to establish with advanced hypertension clinic. She attributes her elevated blood pressure today to hearing that her sister went into emergency surgery this morning in [place], [place]. She reports feeling drained. Feels she wears out quickly. Notes if she sits for a prolonged period her hands start feeling numb and her legs start aching. Her blood pressure at home  130s-160s. Reports blood pressure has been staying high. She is uncertain of which medications she is taking today. Has been told she snores and that she stops breathing when sleeping. Notes she does not sleep well and often wakes up for hours at a time.   Previous antihypertensives: Diltiazem - switched to Amlodipine Atenolol -   Past Medical History:  Diagnosis Date   Allergy    Aneurysm of ascending aorta ([place]) 02/25/2016   3.9cm by echo 02/2016  -aorta enlarged   Arthritis    CAD in native artery 11/19/2020   Chest pain 01/24/2016   Dyspnea on exertion    HLD (hyperlipidemia)    Hypertension    Urge incontinence     Past Surgical History:  Procedure Laterality Date   ABDOMINAL HYSTERECTOMY     still has cervix; DUB; ovaries intact.   COLONOSCOPY WITH PROPOFOL N/A 11/03/2021   Procedure: COLONOSCOPY WITH PROPOFOL;  Surgeon: Lin Landsman, MD;  Location: [facility] ENDOSCOPY;  Service: Gastroenterology;  Laterality: N/A;   ESOPHAGOGASTRODUODENOSCOPY (EGD) WITH PROPOFOL N/A 11/03/2021   Procedure: ESOPHAGOGASTRODUODENOSCOPY (EGD) WITH PROPOFOL;  Surgeon: Lin Landsman, MD;  Location: [facility] ENDOSCOPY;  Service: Gastroenterology;  Laterality: N/A;   KNEE ARTHROSCOPY Left 2012   LEFT HEART CATH AND CORONARY ANGIOGRAPHY N/A 04/13/2022   Procedure: LEFT HEART CATH AND CORONARY ANGIOGRAPHY;  Surgeon: Nelva Bush, MD;  Location: [facility];  Service: Cardiovascular;  Laterality: N/A;   TOTAL KNEE ARTHROPLASTY Left 09/28/2016   TOTAL KNEE ARTHROPLASTY Left 09/28/2016   Procedure: LEFT TOTAL KNEE ARTHROPLASTY;  Surgeon: Vickey Huger, MD;  Location: [facility];  Service: Orthopedics;  Laterality: Left;    Current Medications: Current Meds  Medication Sig   amLODIPine-Valsartan-HCTZ 10-320-25 MG TABS Take 1 tablet by mouth daily.   Current Facility-Administered Medications for the 05/07/22 encounter (Office Visit) with Loel Dubonnet, NP  Medication   sodium chloride flush  (NS) 0.9 % injection 3 mL     Allergies:   Celecoxib, Mirabegron, and Latex   Social History   Socioeconomic History   Marital status: Single    Spouse name: Not on file   Number of children: Not on file   Years of education: Not on file   Highest education level: Not on file  Occupational History   Not on file  Tobacco Use   Smoking status: Never   Smokeless tobacco: Never  Vaping Use   Vaping Use: Never used  Substance and Sexual Activity   Alcohol use: Yes    Comment: occ vodka.NONE LAST 24HRS   Drug use: No   Sexual activity: Yes    Birth control/protection: Surgical, Post-menopausal  Other Topics Concern   Not on file  Social History Narrative   Marital status: single; not dating since 2014      Children: 2 daughters (16, 69); no grandchildren      Lives: with 2 daughters       Employment: tobacco company; Stage manager x 16 years; loves job!  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        Tobacco: none      Alcohol; weekends/socially      Drugs: none      Exercise:  Two days per week; cardio      Seatbelt: 100%; no texting      Sexual activity: not sexually active since 2014; dates males; no STDs; total partners = 7.   Social Determinants of Health   Financial Resource Strain: Low Risk  (11/19/2020)   Overall Financial Resource Strain (CARDIA)    Difficulty of Paying Living Expenses: Not hard at all  Food Insecurity: No Food Insecurity (11/19/2020)   Hunger Vital Sign    Worried About Running Out of Food in the Last Year: Never true    Ran Out of Food in the Last Year: Never true  Transportation Needs: No Transportation Needs (11/19/2020)   PRAPARE - Hydrologist (Medical): No    Lack of Transportation (Non-Medical): No  Physical Activity: Inactive (11/19/2020)   Exercise Vital Sign    Days of Exercise per Week: 0 days    Minutes of Exercise per Session: 0 min  Stress: Not on file  Social Connections: Not  on file     Family History: The patient's family history includes Diabetes in her mother and sister; Epilepsy in her brother; Heart attack in her mother; Heart disease in her mother; Hyperlipidemia in her mother and sister; Hypertension in her mother and sister; Kidney disease in her mother; Mental illness in her brother; Multiple sclerosis in her sister. There is no history of Sudden death.  ROS:   Please see the history of present illness.     All other systems reviewed and are negative.  EKGs/Labs/Other Studies Reviewed:    EKG:  EKG is not ordered today.   Echo 04/23/2022  1. Left ventricular ejection fraction, by estimation, is 60 to 65%. The  left ventricle has normal function. The left ventricle has no regional  wall motion abnormalities. Left  ventricular diastolic parameters were  normal. The average left ventricular  global longitudinal strain is -19.4 %.   2. Right ventricular systolic function is normal. The right ventricular  size is normal.   3. The mitral valve is normal in structure. Mild mitral valve  regurgitation. No evidence of mitral stenosis.   4. The aortic valve is tricuspid. Aortic valve regurgitation is not  visualized. No aortic stenosis is present.   5. The inferior vena cava is normal in size with greater than 50%  respiratory variability, suggesting right atrial pressure of 3 mmHg.   LHC 04/13/2022 Conclusions: Mild, non-obstructive atherosclerotic coronary artery disease.  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 Low normal left ventricular systolic function with subtle mid anterior hypokinesis (LVEF 50-55%). Mildly elevated left ventricular filling pressure (LVEDP 23 mmHg).   Recommendations: Continue statin therapy and blood pressure control.  Recommend indefinite aspirin 81 mg daily in the setting of coronary artery ectasia/aneurysms. Proceed with echo to better assess  LVEF. Consider escalation of diuresis/medical therapy of HFpEF.  ABI 10/15/2020: Summary:  Right: Resting right ankle-brachial index is within normal range. No  evidence of significant right lower extremity arterial disease. The right  toe-brachial index is normal.   Left: Resting left ankle-brachial index is within normal range. No  evidence of significant left lower extremity arterial disease. The left  toe-brachial index is normal.    CT Angio Chest and Aorta 08/23/2020: IMPRESSION: 1. No evidence of thoracic aortic aneurysm or dissection. 2. Aortic atherosclerosis, in addition to left main and 2 vessel coronary artery disease. Please note that although the presence of coronary artery calcium documents the presence of coronary artery disease, the severity of this disease and any potential stenosis cannot be assessed on this non-gated CT examination. Assessment for potential risk factor modification, dietary therapy or pharmacologic therapy may be warranted, if clinically indicated. 3. Mild cardiomegaly.   Aortic Atherosclerosis (ICD10-I70.0).   Coronary CT  08/23/2020: IMPRESSION: 1. Coronary calcium score of 206. This was 96th percentile for age, sex, and race matched control.   2. Normal coronary origin with right dominance.   3. CAD-RADS 1. Minimal non-obstructive CAD (1-24%). Consider non-atherosclerotic causes of chest pain. Consider preventive therapy and risk factor modification.   4. There is evidence of distal left main dilation into the proximal LAD, maximal diameter 10.4 mm. Diameter meets criteria for aneurysm.   Lexiscan Myoview 06/23/2019: Electrically nondiagnostic for ischemia Normal perfusion. No ischemia or scar LVEF 70% Low risk study.   Echo 06/23/2019:  1. Left ventricular ejection fraction, by estimation, is 60 to 65%. The  left ventricle has normal function. The left ventricle has no regional  wall motion abnormalities. There is mild concentric  left ventricular  hypertrophy. Left ventricular diastolic  parameters are indeterminate.   2. Right ventricular systolic function is normal. The right ventricular  size is normal. There is normal pulmonary artery systolic pressure.   3. The mitral valve is normal in structure. Trivial mitral valve  regurgitation. No evidence of mitral stenosis.   4. The aortic valve is normal in structure. Aortic valve regurgitation is  not visualized. No aortic stenosis is present.   5. The inferior vena cava is normal in size with greater than 50%  respiratory variability, suggesting right atrial pressure of 3 mmHg.    Echo 02/21/16: Study Conclusions - Left ventricle: The cavity size was normal. There was mild   concentric hypertrophy. Systolic  function was normal. The   estimated ejection fraction was in the range of 55% to 60%. Wall   motion was normal; there were no regional wall motion   abnormalities. - Aortic valve: Trileaflet; normal thickness, mildly calcified   leaflets. - Aorta: Ascending aortic diameter: 39 mm (S). - Aortic root: The aortic root was normal in size. - Ascending aorta: The ascending aorta was mildly dilated. - Mitral valve: There was trivial regurgitation. - Tricuspid valve: There was trivial regurgitation.   Exercise Myoview 02/14/16: The left ventricular ejection fraction is mildly decreased (45-54%). Nuclear stress EF: 53%. Blood pressure demonstrated a hypertensive response to exercise. Baseline EKG showed NSR with diffuse ST/T wave abnormality in the inferolateral leads. During stress there was 38m J point depression from baseline with horizontal ST segment depression in the inferolateral leads. EKG nondiagnostic due to resting EKG changes. The study is normal. This is a low risk study. Recent Labs: 12/19/2021: ALT 11 04/08/2022: BUN 15; Creatinine, Ser 0.76; Potassium 4.4; Sodium 141 05/04/2022: Hemoglobin 12.7; Platelets 245   Recent Lipid Panel    Component  Value Date/Time   CHOL 176 12/19/2021 1108   TRIG 44 12/19/2021 1108   HDL 91 12/19/2021 1108   CHOLHDL 1.9 12/19/2021 1108   CHOLHDL 2.1 12/20/2015 1123   VLDL 9 12/20/2015 1123   LDLCALC 76 12/19/2021 1108   Physical Exam:   VS:  BP (!) 151/94   Pulse 64   Ht 5' 3.5" (1.613 m)   Wt 219 lb (99.3 kg)   BMI 38.19 kg/m  , BMI Body mass index is 38.19 kg/m. GENERAL:  Well appearing HEENT: Pupils equal round and reactive, fundi not visualized, oral mucosa unremarkable NECK:  No jugular venous distention, waveform within normal limits, carotid upstroke brisk and symmetric, no bruits, no thyromegaly LYMPHATICS:  No cervical adenopathy LUNGS:  Clear to auscultation bilaterally HEART:  RRR.  PMI not displaced or sustained,S1 and S2 within normal limits, no S3, no S4, no clicks, no rubs, no murmurs ABD:  Flat, positive bowel sounds normal in frequency in pitch, no bruits, no rebound, no guarding, no midline pulsatile mass, no hepatomegaly, no splenomegaly EXT:  2 plus pulses throughout, no edema, no cyanosis no clubbing SKIN:  No rashes no nodules NEURO:  Cranial nerves II through XII grossly intact, motor grossly intact throughout PSYCH:  Cognitively intact, oriented to person place and time  ASSESSMENT/PLAN:    HTN - BP not at goal <130/80. She is somewhat unsure of which medications she is taking. For simplification of regimen, discontinue Telmisartan-HCTZ, HCTZ, Amlodipine and start Amlodipine-Valsartan-HCTZ 10-320-'25mg'$  QD.  Enrolled in RPM research study today.  Labs in 2 weeks BMP, cortisol, TSH, catecholamines, metanephrines.  Renal duplex to rule out renal artery stenosis. Sleep study as below. Consider renal denervation if no secondary cause identified.  Snores - Notes snoring, daytime somnolence. STOP Bang 6. Home sleep study ordered.    CAD - Stable with no anginal symptoms. LHC 04/13/22 with nonobstructive disease. Continue GDMT Aspirin, Rosuvastatin, Zetia.   HLD -  Continue Rosuvastatin '20mg'$  QD, Zetia '10mg'$  QD.   Screening for Secondary Hypertension:     05/07/2022    9:25 AM  Causes  Renovascular HTN Screened  Sleep Apnea Screened  Thyroid Disease Screened  Hyperaldosteronism Not Screened     - Comments Hesitant to hold ARB  Pheochromocytoma Screened  Cushing's Syndrome Screened  Hyperparathyroidism Screened    Relevant Labs/Studies:    Latest Ref Rng & Units 04/08/2022  9:31 AM 12/19/2021   11:08 AM 11/19/2020   10:00 AM  Basic Labs  Sodium 134 - 144 mmol/L 141  135  141   Potassium 3.5 - 5.2 mmol/L 4.4  4.0  4.0   Creatinine 0.57 - 1.00 mg/dL 0.76  0.79  0.99        Latest Ref Rng & Units 05/24/2017    5:31 PM 12/20/2015   11:23 AM  Thyroid   TSH 0.450 - 4.500 uIU/mL 1.240  0.81                 05/07/2022    9:10 AM  Renovascular   Renal Artery [place] Completed Yes      she consents to be monitored in our remote patient monitoring program through [facility].  she will track his blood pressure twice daily and understands that these trends will help [place] to adjust her medications as needed prior to his next appointment.      Disposition:    FU with APP/PharmD in 2 months and MD in 4 months.   Medication Adjustments/Labs and Tests Ordered: Current medicines are reviewed at length with the patient today.  Concerns regarding medicines are outlined above.  Orders Placed This Encounter  Procedures   Metanephrines, plasma   TSH   Catecholamines, fractionated, plasma   Cortisol   Basic metabolic panel   Cantril's Ladder Assessment   Home sleep test   VAS US RENAL ARTERY DUPLEX   Meds ordered this encounter  Medications   amLODIPine-Valsartan-HCTZ 10-320-25 MG TABS    Sig: Take 1 tablet by mouth daily.    Dispense:  30 tablet    Refill:  6     Signed, Loel Dubonnet, NP  05/07/2022 9:27 AM    [facility]

## 2022-05-08 ENCOUNTER — Telehealth: Payer: Self-pay

## 2022-05-08 DIAGNOSIS — Z0000 Encounter for general adult medical examination without abnormal findings: Secondary | ICD-10-CM

## 2022-05-08 NOTE — Telephone Encounter (Signed)
Called patient to conduct Welcome Call. Patient confirmed prompt times work for her but she didn't check her bp because she hasn't received her new medication for bp and wasn't sure if she should start now. Patient was advised to start checking bp and went over protocol of checking bp after starting medication. Patient has afterhours numbers to contact if she has any questions. Patient was offered health coaching for eating habits. Patient declined.  Avelino Leeds, MS, ERHD, [facility]  Care Guide, Health & Wellness Coach [address], [address] [place] [address] Telephone: [phone] Email: [email or web address]

## 2022-05-11 ENCOUNTER — Telehealth: Payer: Self-pay | Admitting: *Deleted

## 2022-05-11 NOTE — Telephone Encounter (Signed)
Per sleep coordinators, sleep study approved, called patient, no answer, left detailed message (ok per DPR). Code 1234.

## 2022-05-11 NOTE — Telephone Encounter (Signed)
Secure chat message sent to Elonda Husky ok to activate itamar device. [facility] Auth # [id number]. Valid dates 05/10/21 to 08/09/22.

## 2022-05-21 ENCOUNTER — Ambulatory Visit (HOSPITAL_COMMUNITY)
Admission: RE | Admit: 2022-05-21 | Discharge: 2022-05-21 | Disposition: A | Discharge status: Home / Self Care | Payer: Medicaid Other | Source: Ambulatory Visit | Attending: Internal Medicine | Admitting: Internal Medicine

## 2022-05-21 ENCOUNTER — Telehealth (HOSPITAL_BASED_OUTPATIENT_CLINIC_OR_DEPARTMENT_OTHER): Payer: Self-pay

## 2022-05-21 DIAGNOSIS — I1A0 Resistant hypertension: Secondary | ICD-10-CM | POA: Insufficient documentation

## 2022-05-21 NOTE — Telephone Encounter (Addendum)
Called results to patient and left results on VM (ok per DPR), instructions left to call office back if patient has any questions!     ----- Message from Loel Dubonnet, NP sent at 05/21/2022  4:52 PM EDT ----- No renal artery stenosis.  Good result!

## 2022-05-26 ENCOUNTER — Telehealth (HOSPITAL_BASED_OUTPATIENT_CLINIC_OR_DEPARTMENT_OTHER): Payer: Self-pay

## 2022-05-26 LAB — BASIC METABOLIC PANEL
BUN/Creatinine Ratio: 26 — ABNORMAL HIGH (ref 9–23)
BUN: 20 mg/dL (ref 6–24)
CO2: 25 mmol/L (ref 20–29)
Calcium: 9.1 mg/dL (ref 8.7–10.2)
Chloride: 105 mmol/L (ref 96–106)
Creatinine, Ser: 0.76 mg/dL (ref 0.57–1.00)
Glucose: 85 mg/dL (ref 70–99)
Potassium: 4.1 mmol/L (ref 3.5–5.2)
Sodium: 143 mmol/L (ref 134–144)
eGFR: 90 mL/min/{1.73_m2} (ref >59)

## 2022-05-26 LAB — METANEPHRINES, PLASMA
Metanephrine, Free: <25 pg/mL (ref 0.0–88.0)
Normetanephrine, Free: 71.6 pg/mL (ref 0.0–244.0)

## 2022-05-26 LAB — CATECHOLAMINES, FRACTIONATED, PLASMA
Dopamine: 49 pg/mL — ABNORMAL HIGH (ref 0–48)
Epinephrine: 26 pg/mL (ref 0–62)
Norepinephrine: 652 pg/mL (ref 0–874)

## 2022-05-26 LAB — TSH: TSH: 2.62 u[IU]/mL (ref 0.450–4.500)

## 2022-05-26 LAB — CORTISOL: Cortisol: 1 ug/dL — ABNORMAL LOW (ref 6.2–19.4)

## 2022-05-26 NOTE — Telephone Encounter (Addendum)
Called results to patient and left results on VM (ok per DPR), instructions left to call office back if patient has any questions!     ----- Message from Loel Dubonnet, NP sent at 05/26/2022  9:44 AM EDT ----- Cortisol level reveals no Cushing syndrome.  Catecholamines and metanephrines with no evidence of pheochromocytoma.  No evidence of secondary hypertension.  Normal kidney function and electrolytes. Good result!

## 2022-06-16 ENCOUNTER — Encounter: Payer: Self-pay | Admitting: Oncology

## 2022-06-16 ENCOUNTER — Telehealth (HOSPITAL_BASED_OUTPATIENT_CLINIC_OR_DEPARTMENT_OTHER): Payer: Self-pay | Admitting: Family

## 2022-06-16 ENCOUNTER — Telehealth: Payer: Self-pay

## 2022-06-16 ENCOUNTER — Other Ambulatory Visit (HOSPITAL_COMMUNITY): Payer: Self-pay

## 2022-06-16 DIAGNOSIS — Z0000 Encounter for general adult medical examination without abnormal findings: Secondary | ICD-10-CM

## 2022-06-16 NOTE — Telephone Encounter (Signed)
THANK YOU Kayla. Certain [facility] pharmacies give [place] a really hard time with the triple therapy for some reason.   [facility] Guys - can we just call her to let her know to pick up tomorrow, please?  TY team! Alver Sorrow, NP

## 2022-06-16 NOTE — Telephone Encounter (Signed)
Called pharmacy, product requires a prior authorization. PA request being faxed to [phone]. [place] Rx prior auth team. Will route them this phone note so they are aware.     Returned call to patient to provide update, no answer, left detailed message ok per DPR.

## 2022-06-16 NOTE — Telephone Encounter (Signed)
Called patient to determine cause for missing bp readings. Patient stated that she checks her bp sometimes but haven't been consistent since she does not have the new medication prescribed by Gillian Shields to take. Patient stated that she was informed that the amLODIPine-Valsartan-HCTZ was denied and pharmacy tried to reach out. Patient was advised on 3/1 on a previous call to continue checking her bp twice daily. Updated [place] on the issue for further follow up.   Renaee Munda, MS, ERHD, [facility]  Care Guide, Health & Wellness Coach [address], [address] [place] [address] Telephone: [phone] Email: [email or web address]

## 2022-06-16 NOTE — Telephone Encounter (Signed)
Returned call to patient, provided the following information. Patient states that she got a text from the pharmacy letting her know she would be able to pick up. Patient is very appreciative of the call.      "Per test claim no PA is required. Max day supply of 30. I ran claim using NDC: 64380-201-01. Spoke with pharmacy to get paid claim. They had to order this manufacturer. Patient can pick up after 3 pm tomorrow 06/17/22."

## 2022-06-16 NOTE — Telephone Encounter (Addendum)
Per test claim no PA is required. Max day supply of 30. I ran claim using NDC: 64380-201-01. Spoke with pharmacy to get paid claim. They had to order this manufacturer. Patient can pick up after 3 pm tomorrow 06/17/22.

## 2022-06-16 NOTE — Telephone Encounter (Signed)
Received notice from health coach the patient has been I am unable to obtain Amlodipine- Valsartan- HCTZ 10-320-25mg  as prescribed at last office visit.  Unclear why pharmacy will not fill as this is on Medicaid's preferred drug list.   Will ask nursing team to reach out to pharmacy for further details.   If needed can Rx as separate tablets Amlodipine-Valsartan 10-32mg  QD and HCTZ 25mg  QD. However, would prefer combination tablet for easier dosing.   If it is a pharmacy specific issue, may benefit from using [facility] at [facility].  For future reference would recommend she reach out to [place] directly if having issues filling a medication.   Alver Sorrow, NP

## 2022-06-25 ENCOUNTER — Other Ambulatory Visit: Payer: Self-pay

## 2022-06-25 ENCOUNTER — Telehealth: Payer: Self-pay

## 2022-06-25 ENCOUNTER — Encounter (HOSPITAL_BASED_OUTPATIENT_CLINIC_OR_DEPARTMENT_OTHER): Payer: Self-pay | Admitting: Family

## 2022-06-25 ENCOUNTER — Ambulatory Visit (INDEPENDENT_AMBULATORY_CARE_PROVIDER_SITE_OTHER): Payer: Medicaid Other | Admitting: Family

## 2022-06-25 VITALS — BP 170/89 | HR 67 | Ht 63.5 in | Wt 230.0 lb

## 2022-06-25 DIAGNOSIS — R0683 Snoring: Secondary | ICD-10-CM | POA: Diagnosis not present

## 2022-06-25 DIAGNOSIS — Z0000 Encounter for general adult medical examination without abnormal findings: Secondary | ICD-10-CM

## 2022-06-25 DIAGNOSIS — I10 Essential (primary) hypertension: Secondary | ICD-10-CM

## 2022-06-25 DIAGNOSIS — I25118 Atherosclerotic heart disease of native coronary artery with other forms of angina pectoris: Secondary | ICD-10-CM

## 2022-06-25 DIAGNOSIS — E785 Hyperlipidemia, unspecified: Secondary | ICD-10-CM | POA: Diagnosis not present

## 2022-06-25 MED ORDER — FUROSEMIDE 20 MG PO TABS
20.0000 mg | ORAL_TABLET | Freq: Every day | ORAL | 2 refills | Status: AC | PRN
  Filled 2022-06-25: qty 30, 30d supply, fill #0

## 2022-06-25 MED ORDER — AMLODIPINE-VALSARTAN-HCTZ 10-320-25 MG PO TABS
1.0000 | ORAL_TABLET | Freq: Every day | ORAL | 5 refills | Status: DC
  Filled 2022-06-25: qty 30, 30d supply, fill #0

## 2022-06-25 NOTE — Progress Notes (Signed)
Advanced Hypertension Clinic Initial Assessment:    Date:  06/26/2022   ID:  Audrey Farley, DOB January 31, 1961, MRN [id number]  PCP:  Franciso Bend, NP  Cardiologist:  Chilton Si, MD  Nephrologist:  Referring MD: Franciso Bend, NP   CC: Hypertension  History of Present Illness:    Audrey Farley is a 62 y.o. female with a hx of HTN, GERD, HLD, nonobstructive CAD.   Exercise Myoview in 2017 with EF 53% with no ischemia.  She achieved 8 METS on Bruce protocol.  Echo 02/2016 EF 55 to 60%, mild dilation ascending aorta 3.9 cm.  Stress test 06/2019 EF 70% with no ischemia.  She was started on a diltiazem due to elevated blood pressure.  Repeat echo 06/2019 LVEF 60 to 65%, mild LVH, diastolic function indeterminate, ascending aorta normal in size.   Seen 08/2020 for acute visit for chest pain.  Coronary CTA with minimal nonobstructive disease.  Calcium score placing her in the 96 percentile.  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  Underwent repeat knee surgery which was performed 06/2021.  Seen 04/08/2022 and due to persistent chest pain recommended for CAD.  Diltiazem was transitioned to amlodipine for improved blood pressure control.  She was referred to the advanced hypertension clinic for evaluation of secondary causes. LHC 04/13/22 mild nonobstructive coronary disease.  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  Echo 04/23/2022 to assess LVEF revealed LVEF 60 to 65%, no RWMA, mild MR.  She was seen 05/07/2022 to establish with advanced hypertension clinic.  For simplification of regimen telmisartan-HCTZ, HCTZ, amlodipine were discontinued and she was instead started on amlodipine-valsartan-HCTZ.  Unfortunately the pharmacy did not make [place] aware that it required a prior authorization so she was unable to start until earlier this month.  Renal duplex 05/21/2022 with no renal  artery stenosis.  Cortisol, catecholamines, metanephrines were unremarkable.  Home sleep study ordered but not yet performed.  She presents today to follow-up in advanced hypertension clinic.  Unfortunately she has had difficulty getting combination blood pressure tablet from her pharmacy.  She has not resumed her telmisartan-amlodipine or hydrochlorothiazide and as such her blood pressure has been elevated persistently in the 160s-170s.  Encouraged her to contact our office should this ever recur.  She reports no chest pain, dyspnea.  Previous antihypertensives: Diltiazem - switched to Amlodipine Atenolol -   Past Medical History:  Diagnosis Date   Allergy    Aneurysm of ascending aorta 02/25/2016   3.9cm by echo 02/2016  -aorta enlarged   Arthritis    CAD in native artery 11/19/2020   Chest pain 01/24/2016   Dyspnea on exertion    HLD (hyperlipidemia)    Hypertension    Urge incontinence     Past Surgical History:  Procedure Laterality Date   ABDOMINAL HYSTERECTOMY     still has cervix; DUB; ovaries intact.   COLONOSCOPY WITH PROPOFOL N/A 11/03/2021   Procedure: COLONOSCOPY WITH PROPOFOL;  Surgeon: Toney Reil, MD;  Location: [facility] ENDOSCOPY;  Service: Gastroenterology;  Laterality: N/A;   ESOPHAGOGASTRODUODENOSCOPY (EGD) WITH PROPOFOL N/A 11/03/2021   Procedure: ESOPHAGOGASTRODUODENOSCOPY (EGD) WITH PROPOFOL;  Surgeon: Toney Reil, MD;  Location: [facility] ENDOSCOPY;  Service: Gastroenterology;  Laterality: N/A;   KNEE ARTHROSCOPY Left 2012   LEFT HEART CATH AND CORONARY ANGIOGRAPHY N/A 04/13/2022   Procedure: LEFT HEART CATH AND CORONARY ANGIOGRAPHY;  Surgeon: Yvonne Kendall, MD;  Location:  [facility] INVASIVE CV LAB;  Service: Cardiovascular;  Laterality: N/A;   TOTAL KNEE ARTHROPLASTY Left 09/28/2016   TOTAL KNEE ARTHROPLASTY Left 09/28/2016   Procedure: LEFT TOTAL KNEE ARTHROPLASTY;  Surgeon: Dannielle Huh, MD;  Location: [facility] OR;  Service: Orthopedics;  Laterality: Left;     Current Medications: Current Meds  Medication Sig   acetaminophen (TYLENOL) 500 MG tablet Take by mouth.   albuterol (VENTOLIN HFA) 108 (90 Base) MCG/ACT inhaler Inhale 2 puffs into the lungs every 4 (four) hours as needed for wheezing or shortness of breath.   amLODIPine-Valsartan-HCTZ 10-320-25 MG TABS Take 1 tablet by mouth daily.   ascorbic acid (VITAMIN C) 250 MG tablet Take 1 tablet by mouth daily.   aspirin EC 81 MG tablet Take 81 mg by mouth daily. Swallow whole.   atorvastatin (LIPITOR) 20 MG tablet Take by mouth.   benzonatate (TESSALON) 100 MG capsule Take by mouth.   celecoxib (CELEBREX) 200 MG capsule Take by mouth.   cholecalciferol (VITAMIN D3) 25 MCG (1000 UNIT) tablet Take 1,000 Units by mouth daily.   CREON 36000-114000 units CPEP capsule Take by mouth.   ezetimibe (ZETIA) 10 MG tablet Take 1 tablet (10 mg total) by mouth daily.   famotidine (PEPCID) 20 MG tablet Take 20 mg by mouth at bedtime.   ferrous sulfate 325 (65 FE) MG EC tablet Take by mouth.   folic acid (FOLVITE) 1 MG tablet Take 1 mg by mouth daily.   HYDROcodone-acetaminophen (NORCO/VICODIN) 5-325 MG tablet Take 1 tablet by mouth every 4 (four) hours as needed.   iron sucrose in sodium chloride 0.9 % 100 mL Take 1 capsule by mouth daily.   methocarbamol (ROBAXIN) 500 MG tablet Take by mouth.   nortriptyline (PAMELOR) 10 MG capsule Take by mouth.   oxyCODONE (OXY IR/ROXICODONE) 5 MG immediate release tablet Take 5-10 mg by mouth every 4 (four) hours as needed.   pantoprazole (PROTONIX) 40 MG tablet Take 1 tablet (40 mg total) by mouth daily before breakfast.   pregabalin (LYRICA) 25 MG capsule Take 25 mg twice a day for a week then increase to 50 mg twice a day and continue that dose   senna-docusate (SENOKOT-S) 8.6-50 MG tablet Take by mouth.   vitamin B-12 (CYANOCOBALAMIN) 1000 MCG tablet Take 1,000 mcg by mouth daily.   [DISCONTINUED] rosuvastatin (CRESTOR) 20 MG tablet Take 1 tablet (20 mg total) by  mouth daily.   Current Facility-Administered Medications for the 06/25/22 encounter (Office Visit) with Alver Sorrow, NP  Medication   sodium chloride flush (NS) 0.9 % injection 3 mL     Allergies:   Celecoxib, Mirabegron, and Latex   Social History   Socioeconomic History   Marital status: Single    Spouse name: Not on file   Number of children: Not on file   Years of education: Not on file   Highest education level: Not on file  Occupational History   Not on file  Tobacco Use   Smoking status: Never   Smokeless tobacco: Never  Vaping Use   Vaping Use: Never used  Substance and Sexual Activity   Alcohol use: Yes    Comment: occ vodka.NONE LAST 24HRS   Drug use: No   Sexual activity: Yes    Birth control/protection: Surgical, Post-menopausal  Other Topics Concern   Not on file  Social History Narrative   Marital status: single; not dating since 2014      Children: 2 daughters (16, 15); no grandchildren  Lives: with 2 daughters       Employment: tobacco company; Agricultural consultant x 16 years; loves job!  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        Tobacco: none      Alcohol; weekends/socially      Drugs: none      Exercise:  Two days per week; cardio      Seatbelt: 100%; no texting      Sexual activity: not sexually active since 2014; dates males; no STDs; total partners = 7.   Social Determinants of Health   Financial Resource Strain: Low Risk  (11/19/2020)   Overall Financial Resource Strain (CARDIA)    Difficulty of Paying Living Expenses: Not hard at all  Food Insecurity: No Food Insecurity (11/19/2020)   Hunger Vital Sign    Worried About Running Out of Food in the Last Year: Never true    Ran Out of Food in the Last Year: Never true  Transportation Needs: No Transportation Needs (11/19/2020)   PRAPARE - Administrator, Civil Service (Medical): No    Lack of Transportation (Non-Medical): No  Physical Activity:  Inactive (11/19/2020)   Exercise Vital Sign    Days of Exercise per Week: 0 days    Minutes of Exercise per Session: 0 min  Stress: Not on file  Social Connections: Not on file     Family History: The patient's family history includes Diabetes in her mother and sister; Epilepsy in her brother; Heart attack in her mother; Heart disease in her mother; Hyperlipidemia in her mother and sister; Hypertension in her mother and sister; Kidney disease in her mother; Mental illness in her brother; Multiple sclerosis in her sister. There is no history of Sudden death.  ROS:   Please see the history of present illness.     All other systems reviewed and are negative.  EKGs/Labs/Other Studies Reviewed:    EKG:  EKG is not ordered today.   Renal duplex 05/21/22  Right: No evidence of right renal artery stenosis. RRV flow present.         Normal size right kidney. Normal right Resisitive Index.         Normal cortical thickness of right kidney.  Left:  No evidence of left renal artery stenosis. LRV flow present.         Normal size of left kidney. Normal left Resistive Index.         Normal cortical thickness of the left kidney.  Mesenteric:  Normal Celiac artery and Superior Mesenteric artery findings.  Echo 04/23/2022  1. Left ventricular ejection fraction, by estimation, is 60 to 65%. The  left ventricle has normal function. The left ventricle has no regional  wall motion abnormalities. Left ventricular diastolic parameters were  normal. The average left ventricular  global longitudinal strain is -19.4 %.   2. Right ventricular systolic function is normal. The right ventricular  size is normal.   3. The mitral valve is normal in structure. Mild mitral valve  regurgitation. No evidence of mitral stenosis.   4. The aortic valve is tricuspid. Aortic valve regurgitation is not  visualized. No aortic stenosis is present.   5. The inferior vena cava is normal in size with greater than 50%   respiratory variability, suggesting right atrial pressure of 3 mmHg.   LHC 04/13/2022 Conclusions: Mild, non-obstructive atherosclerotic coronary artery disease.  The LMCA and proximal RCA appear ectatic/aneurysmal; question if the patient had [facility]  disease as a child. Low normal left ventricular systolic function with subtle mid anterior hypokinesis (LVEF 50-55%). Mildly elevated left ventricular filling pressure (LVEDP 23 mmHg).   Recommendations: Continue statin therapy and blood pressure control.  Recommend indefinite aspirin 81 mg daily in the setting of coronary artery ectasia/aneurysms. Proceed with echo to better assess LVEF. Consider escalation of diuresis/medical therapy of HFpEF.  ABI 10/15/2020: Summary:  Right: Resting right ankle-brachial index is within normal range. No  evidence of significant right lower extremity arterial disease. The right  toe-brachial index is normal.   Left: Resting left ankle-brachial index is within normal range. No  evidence of significant left lower extremity arterial disease. The left  toe-brachial index is normal.    CT Angio Chest and Aorta 08/23/2020: IMPRESSION: 1. No evidence of thoracic aortic aneurysm or dissection. 2. Aortic atherosclerosis, in addition to left main and 2 vessel coronary artery disease. Please note that although the presence of coronary artery calcium documents the presence of coronary artery disease, the severity of this disease and any potential stenosis cannot be assessed on this non-gated CT examination. Assessment for potential risk factor modification, dietary therapy or pharmacologic therapy may be warranted, if clinically indicated. 3. Mild cardiomegaly.   Aortic Atherosclerosis (ICD10-I70.0).   Coronary CT  08/23/2020: IMPRESSION: 1. Coronary calcium score of 206. This was 96th percentile for age, sex, and race matched control.   2. Normal coronary origin with right dominance.   3. CAD-RADS 1.  Minimal non-obstructive CAD (1-24%). Consider non-atherosclerotic causes of chest pain. Consider preventive therapy and risk factor modification.   4. There is evidence of distal left main dilation into the proximal LAD, maximal diameter 10.4 mm. Diameter meets criteria for aneurysm.   Lexiscan Myoview 06/23/2019: Electrically nondiagnostic for ischemia Normal perfusion. No ischemia or scar LVEF 70% Low risk study.   Echo 06/23/2019:  1. Left ventricular ejection fraction, by estimation, is 60 to 65%. The  left ventricle has normal function. The left ventricle has no regional  wall motion abnormalities. There is mild concentric left ventricular  hypertrophy. Left ventricular diastolic  parameters are indeterminate.   2. Right ventricular systolic function is normal. The right ventricular  size is normal. There is normal pulmonary artery systolic pressure.   3. The mitral valve is normal in structure. Trivial mitral valve  regurgitation. No evidence of mitral stenosis.   4. The aortic valve is normal in structure. Aortic valve regurgitation is  not visualized. No aortic stenosis is present.   5. The inferior vena cava is normal in size with greater than 50%  respiratory variability, suggesting right atrial pressure of 3 mmHg.    Echo 02/21/16: Study Conclusions - Left ventricle: The cavity size was normal. There was mild   concentric hypertrophy. Systolic function was normal. The   estimated ejection fraction was in the range of 55% to 60%. Wall   motion was normal; there were no regional wall motion   abnormalities. - Aortic valve: Trileaflet; normal thickness, mildly calcified   leaflets. - Aorta: Ascending aortic diameter: 39 mm (S). - Aortic root: The aortic root was normal in size. - Ascending aorta: The ascending aorta was mildly dilated. - Mitral valve: There was trivial regurgitation. - Tricuspid valve: There was trivial regurgitation.   Exercise Myoview 02/14/16: The  left ventricular ejection fraction is mildly decreased (45-54%). Nuclear stress EF: 53%. Blood pressure demonstrated a hypertensive response to exercise. Baseline EKG showed NSR with diffuse ST/T wave abnormality in the inferolateral leads.  During stress there was 1mm J point depression from baseline with horizontal ST segment depression in the inferolateral leads. EKG nondiagnostic due to resting EKG changes. The study is normal. This is a low risk study. Recent Labs: 12/19/2021: ALT 11 05/04/2022: Hemoglobin 12.7; Platelets 245 05/21/2022: BUN 20; Creatinine, Ser 0.76; Potassium 4.1; Sodium 143; TSH 2.620   Recent Lipid Panel    Component Value Date/Time   CHOL 176 12/19/2021 1108   TRIG 44 12/19/2021 1108   HDL 91 12/19/2021 1108   CHOLHDL 1.9 12/19/2021 1108   CHOLHDL 2.1 12/20/2015 1123   VLDL 9 12/20/2015 1123   LDLCALC 76 12/19/2021 1108   Physical Exam:   VS:  BP (!) 170/89   Pulse 67   Ht 5' 3.5" (1.613 m)   Wt 230 lb (104.3 kg)   BMI 40.10 kg/m  , BMI Body mass index is 40.1 kg/m. GENERAL:  Well appearing HEENT: Pupils equal round and reactive, fundi not visualized, oral mucosa unremarkable NECK:  No jugular venous distention, waveform within normal limits, carotid upstroke brisk and symmetric, no bruits, no thyromegaly LYMPHATICS:  No cervical adenopathy LUNGS:  Clear to auscultation bilaterally HEART:  RRR.  PMI not displaced or sustained,S1 and S2 within normal limits, no S3, no S4, no clicks, no rubs, no murmurs ABD:  Flat, positive bowel sounds normal in frequency in pitch, no bruits, no rebound, no guarding, no midline pulsatile mass, no hepatomegaly, no splenomegaly EXT:  2 plus pulses throughout, no edema, no cyanosis no clubbing SKIN:  No rashes no nodules NEURO:  Cranial nerves II through XII grossly intact, motor grossly intact throughout PSYCH:  Cognitively intact, oriented to person place and time  ASSESSMENT/PLAN:    HTN - BP not at goal <130/80.   Having difficulty with her local pharmacy obtaining amlodipine-valsartan-HCTZ.  As such I contacted [facility] and the [place] location is able to fill her amlodipine-valsartan-HCTZ 10 - 30 - 25 mg daily this afternoon.  She will pick up and have BMP 1 to 2 weeks after starting the new medication.  Continue to participate in remote patient monitoring through vivify.  cortisol, TSH, catecholamines, metanephrines, renal duplex unremarkable. Sleep study awaiting scheduling. Consider renal denervation if no secondary cause identified.  Snores - Notes snoring, daytime somnolence. STOP Bang 6. Home sleep study previously ordered. Awaiting scheduling.    CAD - Stable with no anginal symptoms. LHC 04/13/22 with nonobstructive disease. Continue GDMT Aspirin, Rosuvastatin, Zetia.   HLD - Continue Rosuvastatin 20mg  QD, Zetia 10mg  QD.   Screening for Secondary Hypertension:     05/07/2022    9:25 AM  Causes  Renovascular HTN Screened  Sleep Apnea Screened  Thyroid Disease Screened  Hyperaldosteronism Not Screened     - Comments Hesitant to hold ARB  Pheochromocytoma Screened  Cushing's Syndrome Screened  Hyperparathyroidism Screened    Relevant Labs/Studies:    Latest Ref Rng & Units 05/21/2022    9:25 AM 04/08/2022    9:31 AM 12/19/2021   11:08 AM  Basic Labs  Sodium 134 - 144 mmol/L 143  141  135   Potassium 3.5 - 5.2 mmol/L 4.1  4.4  4.0   Creatinine 0.57 - 1.00 mg/dL 1.61  0.96  0.45        Latest Ref Rng & Units 05/21/2022    9:25 AM 05/24/2017    5:31 PM  Thyroid   TSH 0.450 - 4.500 uIU/mL 2.620  1.240  Latest Ref Rng & Units 05/21/2022    9:25 AM  Metanephrines/Catecholamines   Epinephrine 0 - 62 pg/mL 26   Norepinephrine 0 - 874 pg/mL 652   Dopamine 0 - 48 pg/mL 49   Metanephrines 0.0 - 88.0 pg/mL <25.0   Normetanephrines  0.0 - 244.0 pg/mL 71.6           05/21/2022    9:28 AM  Renovascular   Renal Artery [place] Completed Yes      Disposition:    FU  with MD in 2 months.   Medication Adjustments/Labs and Tests Ordered: Current medicines are reviewed at length with the patient today.  Concerns regarding medicines are outlined above.  Orders Placed This Encounter  Procedures   Basic metabolic panel   Meds ordered this encounter  Medications   amLODIPine-Valsartan-HCTZ 10-320-25 MG TABS    Sig: Take 1 tablet by mouth daily.    Dispense:  30 tablet    Refill:  5    Order Specific Question:   Supervising Provider    Answer:   Jodelle Red [[id number]]   furosemide (LASIX) 20 MG tablet    Sig: Take 1 tablet (20 mg total) by mouth daily as needed for edema.    Dispense:  30 tablet    Refill:  2    Order Specific Question:   Supervising Provider    Answer:   Jodelle Red [[id number]]     Signed, Alver Sorrow, NP  06/26/2022 5:14 PM    [facility]

## 2022-06-25 NOTE — Telephone Encounter (Signed)
Called patient to determine if she were able to reconcile her medication that she had not started and to motivate patient to resume checking bp in Vivify. Left message for patient to return call.   Renaee Munda, MS, ERHD, [facility]  Care Guide, Health & Wellness Coach [address] [place] [address] Telephone: [phone] Email: [email or web address]

## 2022-06-25 NOTE — Patient Instructions (Signed)
Medication Instructions:  Your physician has recommended you make the following change in your medication:   Start: Amlodipine- Valsartan- HCTZ 10-320-25mg  daily    Labwork: Please return for Lab work BMP 1-2 weeks after starting new medication. You may come to the...   [facility] (3rd floor) [address] [place], [place] [address]  Open: 8am-Noon and 1pm-4:30pm  Please ring the doorbell on the small table when you exit the elevator and the Lab Tech will come get you  [facility] at [facility] [address], [place], [place] [address] Open: 8am-1pm, then 2pm-4:30pm   [facility]- Please see attached locations sheet stapled to your lab work with address and hours.   Follow-Up: Follow up as scheduled    Special Instructions:  [facility] at [facility] [address] [place],  [place]  [address]

## 2022-06-26 ENCOUNTER — Encounter (HOSPITAL_BASED_OUTPATIENT_CLINIC_OR_DEPARTMENT_OTHER): Payer: Self-pay | Admitting: Family

## 2022-08-27 ENCOUNTER — Encounter: Payer: Self-pay | Admitting: Oncology

## 2022-08-31 ENCOUNTER — Telehealth (HOSPITAL_BASED_OUTPATIENT_CLINIC_OR_DEPARTMENT_OTHER): Payer: Self-pay | Admitting: *Deleted

## 2022-08-31 ENCOUNTER — Encounter (HOSPITAL_BASED_OUTPATIENT_CLINIC_OR_DEPARTMENT_OTHER): Payer: Self-pay | Admitting: Cardiovascular Disease

## 2022-08-31 ENCOUNTER — Telehealth (HOSPITAL_BASED_OUTPATIENT_CLINIC_OR_DEPARTMENT_OTHER): Payer: Self-pay | Admitting: Licensed Clinical Social Worker

## 2022-08-31 ENCOUNTER — Other Ambulatory Visit: Payer: Self-pay

## 2022-08-31 ENCOUNTER — Ambulatory Visit (INDEPENDENT_AMBULATORY_CARE_PROVIDER_SITE_OTHER): Payer: Medicaid Other | Admitting: Cardiovascular Disease

## 2022-08-31 VITALS — BP 178/86 | HR 65 | Ht 63.5 in | Wt 234.8 lb

## 2022-08-31 DIAGNOSIS — I7121 Aneurysm of the ascending aorta, without rupture: Secondary | ICD-10-CM

## 2022-08-31 DIAGNOSIS — I10 Essential (primary) hypertension: Secondary | ICD-10-CM

## 2022-08-31 DIAGNOSIS — I2510 Atherosclerotic heart disease of native coronary artery without angina pectoris: Secondary | ICD-10-CM

## 2022-08-31 DIAGNOSIS — E785 Hyperlipidemia, unspecified: Secondary | ICD-10-CM | POA: Diagnosis not present

## 2022-08-31 DIAGNOSIS — Z006 Encounter for examination for normal comparison and control in clinical research program: Secondary | ICD-10-CM

## 2022-08-31 DIAGNOSIS — R079 Chest pain, unspecified: Secondary | ICD-10-CM

## 2022-08-31 MED ORDER — AMLODIPINE-VALSARTAN-HCTZ 10-160-25 MG PO TABS
1.0000 | ORAL_TABLET | Freq: Every day | ORAL | 3 refills | Status: DC
  Filled 2022-08-31: qty 90, 90d supply, fill #0
  Filled 2023-03-18: qty 90, 90d supply, fill #1

## 2022-08-31 NOTE — Telephone Encounter (Signed)
Vivify blood pressure machine returned

## 2022-08-31 NOTE — Addendum Note (Signed)
Addended by: Chilton Si C on: 08/31/2022 12:10 PM   Modules accepted: Orders

## 2022-08-31 NOTE — Progress Notes (Signed)
Heart and Vascular Care Navigation  08/31/2022  Audrey Farley 02-14-1961 [id number]  Reason for Referral: unemployed, housing instability  Patient is participating in a Managed Medicaid Plan:Yes  Engaged with patient by telephone for initial visit for Heart and Vascular Care Coordination.                                                                                                   Assessment:                    LCSW was able to receive call back from pt this afternoon. Introduced self, role, reason for call. Pt confirmed current listed address is for the home that they are being asked to move out of 09/07/22. Home is owned by a friend who is having to sell it, pt was living there rent free with her adult daughter. Her daughter works and is in school. She has another daughter who lives in public housing. [place] two [place] and in [place]- cannot stay with either.   She is not currently employed, has been waiting on disability determination, previously denied, reapplied and working with a Clinical research associate. She drives, has access to transportation. She has Medicaid, started receiving SNAP. Updated her PCP to Franco Nones, FNP. She is concerned as her daughter is able to put some money towards an apartment but they dont have significant assets and they have found most complexes want proof of income more than she or her daughter have. They can afford $800/mo. They have applied for Section 8, on waitlist. She is not sure where they will go after. We discussed contacting coordinated entry with her current status, provided her that number via text. Will look for any housing in her budget, discussed we may be able to assist with deposit but depends on current situation.                      HRT/VAS Care Coordination     Patients Home Cardiology Office --  [facility]   Outpatient Care Team Social Worker   Social Worker Name: [name], [place], [phone]   Living arrangements for the past 2  months Single Family Home   Lives with: Adult Children   Patient Current Insurance Coverage Medicaid   Patient Has Concern With Paying Medical Bills No   Does Patient Have Prescription Coverage? Yes   Home Assistive Devices/Equipment Walker (specify type); Bedside commode/3-in-1   DME Agency [employer]   HH Agency [facility]   Current home services Homehealth aide       Social History:                                                                             SDOH Screenings  Food Insecurity: No Food Insecurity (08/31/2022)  Housing: Medium Risk (08/31/2022)  Transportation Needs: No Transportation Needs (11/19/2020)  Utilities: Not At Risk (08/31/2022)  Alcohol Screen: Low Risk  (11/19/2020)  Depression (PHQ2-9): Low Risk  (01/19/2020)  Financial Resource Strain: High Risk (08/31/2022)  Physical Activity: Inactive (11/19/2020)  Tobacco Use: Low Risk  (08/31/2022)    SDOH Interventions: Financial Resources:  Financial Strain Interventions: Other (Comment) (unemployed; pending disability, pt daughter earns income, receives [employer], will connect with Scientist, research (life sciences)) DSS for financial assistance and Social Security for Disability application assistance  Food Insecurity:  Food Insecurity Interventions: Other (Comment) (receives [employer])  Housing Insecurity:  Housing Interventions: Other (Comment) (referral to Coordinated Entry. pt has applied for Section 8. will send housing lists)  Transportation:    Transportation Interventions: has access to [facility] transportation, has car and drives herself    Other Care Navigation Interventions:     Provided Pharmacy assistance resources  Pt denies any issues obtaining her medications   Follow-up plan:   LCSW texted pt coordinated entry number to pt and encouraged her to call today, will search any additional housing options. At this time limited assistance available outside of  coordinated entry. Will touch base with pt again before end of the week with any additional options.

## 2022-08-31 NOTE — Research (Signed)
I saw pt today after Dr. Amberley's follow up visit. Pt is in Dr. Hennepin's Virtual Care HTN Study. Pt filled out research survey. Pt was enrolled in Group 2. Pt has completed the Virtual Care HTN Study. 

## 2022-08-31 NOTE — Patient Instructions (Addendum)
Medication Instructions:  DECREASE YOUR TO 10-160-25 MG DAILY   Labwork: SED RATE/CRP/LP/CMET TODAY   Testing/Procedures: NONE  Follow-Up: 11/26/2022 10:05 am with Ronn Melena NP    Any Other Special Instructions Will Be Listed Below (If Applicable).  Try using Voltaran gel  Will have social worker reach out to you

## 2022-08-31 NOTE — Telephone Encounter (Signed)
H&V Care Navigation CSW Progress Note  Clinical Social Worker contacted patient by phone to f/u on referral for housing concerns (per [place], [place], pt current housing is being sold, no income, concerned about next steps). Pt answered at [phone], was driving. I sent a text letting pt know name and title, encouraged her to call me back when she was able. Pt texted back she is driving. Will re-attempt as able.  Patient is participating in a Managed Medicaid Plan:  Yes  SDOH Screenings   Food Insecurity: No Food Insecurity (11/19/2020)  Housing: Low Risk  (11/19/2020)  Transportation Needs: No Transportation Needs (11/19/2020)  Alcohol Screen: Low Risk  (11/19/2020)  Depression (PHQ2-9): Low Risk  (01/19/2020)  Financial Resource Strain: Low Risk  (11/19/2020)  Physical Activity: Inactive (11/19/2020)  Tobacco Use: Low Risk  (08/31/2022)    [name], MSW, LCSW Clinical Social Worker II [facility] Heart/Vascular Care Navigation  [phone]- work cell phone (preferred) [phone]- desk phone

## 2022-08-31 NOTE — Progress Notes (Signed)
Cardiology Office Note   Date:  08/31/22   ID:  Audrey Farley, DOB [birth year], MRN [id number]  PCP:  Franciso Bend, NP  Cardiologist:   Chilton Si, MD   No chief complaint on file.     History of Present Illness: Audrey Farley is a 62 y.o. female with nonobstructive CAD, coronary ectasia, hyperlipidemia,hypertension who presents for follow up.  She saw Dr. Nilda Simmer on 12/2015 and reported chest pain and shortness of breath.  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  She was referred for an exercise Myoview that revealed LVEF 53% with no ischemia.  She achieved 8 METS on the Bruce protocol.  She also had an echo 02/2016 that showed LVEF 55-60% and mild dilation of the ascending aorta (3.9 cm).  She had a stress 06/2019  And LVEF was 70%.  There was no ischemia. Her BP was high so she started diltiazem 120mg  daily. She had a repeat echocardiogram 06/2019 that revealed LVEF 60 to 65% with mild LVH.  Diastolic function was indeterminate.  Her ascending aorta was normal in size.  Her prior echocardiogram in 2017 revealed an ascending aorta of 3.9 cm.   She saw Dr. Cristal Deer on 08/2020 as an acute visit for chest pain. Her symptoms were non-exertional and atypical. She had a coronary CT that revealed minimal non-obstructive disease. Her calcium score was 96th percentile. 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 She was started on rosuvastatin 09/2020.   On 11/19/20, she reported her blood pressure averaged in the 120s-130s. Previously her blood pressure has been averaging over 150-160. She was seen by Gillian Shields, NP on 12/19/21. She reported an occasional sharp sensation in her mid chest when laying down associated with palpitations.  EKG revealed PVCs and she declined diltiazem.  At her visit 03/2022 she also reported exertional chest pain.  In addition to her chest pain when lying down.  Given her history of CAD on chest CT, she  was referred for cardiac catheterization.  Cath 04/2022 revealed mild non-obstructive disease in the left main and proximal RCA.  There is a question of Kawasaki disease as a child given the proximal ectasia/aneurysmal appearance.  LVEDP was 23 mmHg.  Was recommended that she remain on aspirin indefinitely.  She had an echo that revealed LVEF 60-65% with normal diastolic function.  RA pressure was 3 mmHg.   She followed up with Gillian Shields, NP 06/2022 and had been out of her Tribenzor blood pressures were elevated.  This was sourced at the common pharmacy.  She was referred for sleep study that has not yet occurred.  She continues to note intermittent chest pain. The pain is described as sharp, occurring predominantly at night when lying down, and occasionally during physical activity such as walking. The patient reports an episode of chest pain on the left side during a walk, which was exacerbated by deep breathing. The patient denies any associated symptoms such as dizziness or lightheadedness.  The patient has been off her reflux medication for approximately three months and reports no change in the chest pain during this period. They also stopped taking their blood pressure medication due to concerns of hypotension, with the lowest recorded blood pressure being 90. Despite the low readings, the patient reports feeling normal.  The patient also reports sinus discomfort on the day of the consultation. They are scheduled for surgery in less than a month and have been trying to  increase their physical activity in preparation. The patient has been experiencing some difficulty with this due to hospital visits and a potential issue with their right leg.        Past Medical History:  Diagnosis Date   Allergy    Aneurysm of ascending aorta (HCC) 02/25/2016   3.9cm by echo 02/2016  -aorta enlarged   Arthritis    CAD in native artery 11/19/2020   Chest pain 01/24/2016   Dyspnea on exertion    HLD  (hyperlipidemia)    Hypertension    Urge incontinence     Past Surgical History:  Procedure Laterality Date   ABDOMINAL HYSTERECTOMY     still has cervix; DUB; ovaries intact.   COLONOSCOPY WITH PROPOFOL N/A 11/03/2021   Procedure: COLONOSCOPY WITH PROPOFOL;  Surgeon: Toney Reil, MD;  Location: [facility] ENDOSCOPY;  Service: Gastroenterology;  Laterality: N/A;   ESOPHAGOGASTRODUODENOSCOPY (EGD) WITH PROPOFOL N/A 11/03/2021   Procedure: ESOPHAGOGASTRODUODENOSCOPY (EGD) WITH PROPOFOL;  Surgeon: Toney Reil, MD;  Location: [facility] ENDOSCOPY;  Service: Gastroenterology;  Laterality: N/A;   KNEE ARTHROSCOPY Left 2012   LEFT HEART CATH AND CORONARY ANGIOGRAPHY N/A 04/13/2022   Procedure: LEFT HEART CATH AND CORONARY ANGIOGRAPHY;  Surgeon: Yvonne Kendall, MD;  Location: [facility] INVASIVE CV LAB;  Service: Cardiovascular;  Laterality: N/A;   TOTAL KNEE ARTHROPLASTY Left 09/28/2016   TOTAL KNEE ARTHROPLASTY Left 09/28/2016   Procedure: LEFT TOTAL KNEE ARTHROPLASTY;  Surgeon: Dannielle Huh, MD;  Location: [facility] OR;  Service: Orthopedics;  Laterality: Left;     Current Outpatient Medications  Medication Sig Dispense Refill   acetaminophen (TYLENOL) 500 MG tablet Take by mouth.     albuterol (VENTOLIN HFA) 108 (90 Base) MCG/ACT inhaler Inhale 2 puffs into the lungs every 4 (four) hours as needed for wheezing or shortness of breath.     amLODIPine-Valsartan-HCTZ 10-320-25 MG TABS Take 1 tablet by mouth daily. 30 tablet 5   ascorbic acid (VITAMIN C) 250 MG tablet Take 1 tablet by mouth daily.     aspirin EC 81 MG tablet Take 81 mg by mouth daily. Swallow whole.     atorvastatin (LIPITOR) 20 MG tablet Take by mouth.     benzonatate (TESSALON) 100 MG capsule Take by mouth.     celecoxib (CELEBREX) 200 MG capsule Take by mouth.     cholecalciferol (VITAMIN D3) 25 MCG (1000 UNIT) tablet Take 1,000 Units by mouth daily.     CREON 36000-114000 units CPEP capsule Take by mouth.     ezetimibe (ZETIA) 10 MG  tablet Take 1 tablet (10 mg total) by mouth daily. 90 tablet 3   famotidine (PEPCID) 20 MG tablet Take 20 mg by mouth at bedtime.     ferrous sulfate 325 (65 FE) MG EC tablet Take by mouth.     folic acid (FOLVITE) 1 MG tablet Take 1 mg by mouth daily.     furosemide (LASIX) 20 MG tablet Take 1 tablet (20 mg total) by mouth daily as needed for edema. 30 tablet 2   HYDROcodone-acetaminophen (NORCO/VICODIN) 5-325 MG tablet Take 1 tablet by mouth every 4 (four) hours as needed. 10 tablet 0   iron sucrose in sodium chloride 0.9 % 100 mL Take 1 capsule by mouth daily.     meloxicam (MOBIC) 15 MG tablet Take 15 mg by mouth as needed for pain.     methocarbamol (ROBAXIN) 500 MG tablet Take by mouth.     nortriptyline (PAMELOR) 10 MG capsule Take by mouth.  oxyCODONE (OXY IR/ROXICODONE) 5 MG immediate release tablet Take 5-10 mg by mouth every 4 (four) hours as needed.     pantoprazole (PROTONIX) 40 MG tablet Take 1 tablet (40 mg total) by mouth daily before breakfast. 90 tablet 3   pregabalin (LYRICA) 25 MG capsule Take 25 mg twice a day for a week then increase to 50 mg twice a day and continue that dose     senna-docusate (SENOKOT-S) 8.6-50 MG tablet Take by mouth.     vitamin B-12 (CYANOCOBALAMIN) 1000 MCG tablet Take 1,000 mcg by mouth daily.     Current Facility-Administered Medications  Medication Dose Route Frequency Provider Last Rate Last Admin   sodium chloride flush (NS) 0.9 % injection 3 mL  3 mL Intravenous Q12H Chilton Si, MD        Allergies:   Celecoxib, Mirabegron, and Latex    Social History:  The patient  reports that she has never smoked. She has never used smokeless tobacco. She reports current alcohol use. She reports that she does not use drugs.   Family History:  The patient's family history includes Diabetes in her mother and sister; Epilepsy in her brother; Heart attack in her mother; Heart disease in her mother; Hyperlipidemia in her mother and sister;  Hypertension in her mother and sister; Kidney disease in her mother; Mental illness in her brother; Multiple sclerosis in her sister.    ROS:   Please see the history of present illness. (+) chest pain All other systems are reviewed and negative.    PHYSICAL EXAM: VS:  BP (!) 178/86   Pulse 65   Ht 5' 3.5" (1.613 m)   Wt 234 lb 12.8 oz (106.5 kg)   SpO2 97%   BMI 40.94 kg/m  , BMI Body mass index is 40.94 kg/m. GENERAL:  Well appearing HEENT: Pupils equal round and reactive, fundi not visualized, oral mucosa unremarkable NECK:  No jugular venous distention, waveform within normal limits, carotid upstroke brisk and symmetric, no bruits, no thyromegaly LUNGS:  Clear to auscultation bilaterally HEART:  RRR.  PMI not displaced or sustained,S1 and S2 within normal limits, no S3, no S4, no clicks, no rubs, no murmurs ABD:  Flat, positive bowel sounds normal in frequency in pitch, no bruits, no rebound, no guarding, no midline pulsatile mass, no hepatomegaly, no splenomegaly EXT:  2 plus pulses throughout, no edema, no cyanosis no clubbing SKIN:  No rashes no nodules. Lipoma on upper back-tender to palpation  NEURO:  Cranial nerves II through XII grossly intact, motor grossly intact throughout PSYCH:  Cognitively intact, oriented to person place and time   EKG: EKG is personally reviewed. 04/08/22: sinus rhythm,  62 bpm 11/19/2020: EKG is not ordered today. 09/22/2019: EKG was not ordered. 11/26/17: Sinus bradycardia.  Rate 56 bpm.  Prior inferior infarct 01/24/16: sinus bradycardia.  Non-specific T wave abnormalities.   ABI 10/15/2020: Summary:  Right: Resting right ankle-brachial index is within normal range. No  evidence of significant right lower extremity arterial disease. The right  toe-brachial index is normal.   Left: Resting left ankle-brachial index is within normal range. No  evidence of significant left lower extremity arterial disease. The left  toe-brachial index is  normal.   CT Angio Chest and Aorta 08/23/2020: IMPRESSION: 1. No evidence of thoracic aortic aneurysm or dissection. 2. Aortic atherosclerosis, in addition to left main and 2 vessel coronary artery disease. Please note that although the presence of coronary artery calcium documents the presence of coronary  artery disease, the severity of this disease and any potential stenosis cannot be assessed on this non-gated CT examination. Assessment for potential risk factor modification, dietary therapy or pharmacologic therapy may be warranted, if clinically indicated. 3. Mild cardiomegaly.   Aortic Atherosclerosis (ICD10-I70.0).  Coronary CT  08/23/2020: IMPRESSION: 1. Coronary calcium score of 206. This was 96th percentile for age, sex, and race matched control.   2. Normal coronary origin with right dominance.   3. CAD-RADS 1. Minimal non-obstructive CAD (1-24%). Consider non-atherosclerotic causes of chest pain. Consider preventive therapy and risk factor modification.   4. There is evidence of distal left main dilation into the proximal LAD, maximal diameter 10.4 mm. Diameter meets criteria for aneurysm.  Lexiscan Myoview 06/23/2019: Electrically nondiagnostic for ischemia Normal perfusion. No ischemia or scar LVEF 70% Low risk study.  Echo 06/23/2019:  1. Left ventricular ejection fraction, by estimation, is 60 to 65%. The  left ventricle has normal function. The left ventricle has no regional  wall motion abnormalities. There is mild concentric left ventricular  hypertrophy. Left ventricular diastolic  parameters are indeterminate.   2. Right ventricular systolic function is normal. The right ventricular  size is normal. There is normal pulmonary artery systolic pressure.   3. The mitral valve is normal in structure. Trivial mitral valve  regurgitation. No evidence of mitral stenosis.   4. The aortic valve is normal in structure. Aortic valve regurgitation is  not  visualized. No aortic stenosis is present.   5. The inferior vena cava is normal in size with greater than 50%  respiratory variability, suggesting right atrial pressure of 3 mmHg.   Echo 02/21/16: Study Conclusions - Left ventricle: The cavity size was normal. There was mild   concentric hypertrophy. Systolic function was normal. The   estimated ejection fraction was in the range of 55% to 60%. Wall   motion was normal; there were no regional wall motion   abnormalities. - Aortic valve: Trileaflet; normal thickness, mildly calcified   leaflets. - Aorta: Ascending aortic diameter: 39 mm (S). - Aortic root: The aortic root was normal in size. - Ascending aorta: The ascending aorta was mildly dilated. - Mitral valve: There was trivial regurgitation. - Tricuspid valve: There was trivial regurgitation.  Exercise Myoview 02/14/16: The left ventricular ejection fraction is mildly decreased (45-54%). Nuclear stress EF: 53%. Blood pressure demonstrated a hypertensive response to exercise. Baseline EKG showed NSR with diffuse ST/T wave abnormality in the inferolateral leads. During stress there was 1mm J point depression from baseline with horizontal ST segment depression in the inferolateral leads. EKG nondiagnostic due to resting EKG changes. The study is normal. This is a low risk study.   Recent Labs: 12/19/2021: ALT 11 05/04/2022: Hemoglobin 12.7; Platelets 245 05/21/2022: BUN 20; Creatinine, Ser 0.76; Potassium 4.1; Sodium 143; TSH 2.620    Lipid Panel    Component Value Date/Time   CHOL 176 12/19/2021 1108   TRIG 44 12/19/2021 1108   HDL 91 12/19/2021 1108   CHOLHDL 1.9 12/19/2021 1108   CHOLHDL 2.1 12/20/2015 1123   VLDL 9 12/20/2015 1123   LDLCALC 76 12/19/2021 1108      Wt Readings from Last 3 Encounters:  08/31/22 234 lb 12.8 oz (106.5 kg)  06/25/22 230 lb (104.3 kg)  05/07/22 219 lb (99.3 kg)      ASSESSMENT AND PLAN: Assessment and Plan    # Chest Pain: #  Non-obstructive CAD: # Coronary ectasia:   Recurrent episodes, particularly at night and during  exertion. Cardiac etiology unlikely given recent heart catheterization showing minimal blockage. Possible musculoskeletal or gastroesophageal reflux disease (GERD) etiology. Mildly elevated inflammatory markers 05/2022. -Check CRP and ESR to assess for possible pericarditis. -Consider trial of over-the-counter Voltaren gel for potential musculoskeletal pain.  # GERD: Patient has been off reflux medication for three months with no change in chest pain. -Resume reflux medication.  # Hypertension: Patient reports low blood pressure readings and has been off medication. However, blood pressure measured high in office. -Reduce valsartan dose to 160mg  in combination pill with amlodipine and hydrochlorothiazide. -Check comprehensive metabolic panel and lipid panel. -Encourage consistent medication use unless symptomatic from low blood pressure.  # Preoperative Evaluation: Upcoming surgery on 09/29/2022. Blood pressure control necessary prior to operation. -Follow up if issues with blood pressure arise before surgery.  # Sinusitis: Current symptoms. -No specific plan discussed.  General Health Maintenance: -Encourage increased cardiovascular exercise. -Follow up in a few months to ensure stabilization of medical conditions.     # SDOH: Will refer to social work team to help address housing concerns.    Current medicines are reviewed at length with the patient today.  The patient does not have concerns regarding medicines.  The following changes have been made:  stop diltiazem.  Start amlodipine 10mg  daily  Labs/ tests ordered today include:   No orders of the defined types were placed in this encounter.   Disposition:   FU in 3 months   Signed, Brenleigh Collet C. Duke Salvia, MD, [facility]  08/31/2022 10:02 AM    [facility]

## 2022-09-01 ENCOUNTER — Other Ambulatory Visit: Payer: Self-pay

## 2022-09-01 LAB — COMPREHENSIVE METABOLIC PANEL
ALT: 13 IU/L (ref 0–32)
AST: 11 IU/L (ref 0–40)
Albumin: 4.1 g/dL (ref 3.8–4.9)
Alkaline Phosphatase: 127 IU/L — ABNORMAL HIGH (ref 44–121)
BUN/Creatinine Ratio: 18 (ref 12–28)
BUN: 14 mg/dL (ref 8–27)
Bilirubin Total: 0.3 mg/dL (ref 0.0–1.2)
CO2: 21 mmol/L (ref 20–29)
Calcium: 9.6 mg/dL (ref 8.7–10.3)
Chloride: 106 mmol/L (ref 96–106)
Creatinine, Ser: 0.76 mg/dL (ref 0.57–1.00)
Globulin, Total: 2.6 g/dL (ref 1.5–4.5)
Glucose: 100 mg/dL — ABNORMAL HIGH (ref 70–99)
Potassium: 4.1 mmol/L (ref 3.5–5.2)
Sodium: 141 mmol/L (ref 134–144)
Total Protein: 6.7 g/dL (ref 6.0–8.5)
eGFR: 90 mL/min/{1.73_m2} (ref >59)

## 2022-09-01 LAB — LIPID PANEL
Chol/HDL Ratio: 2.4 ratio (ref 0.0–4.4)
Cholesterol, Total: 212 mg/dL — ABNORMAL HIGH (ref 100–199)
HDL: 90 mg/dL (ref >39)
LDL Chol Calc (NIH): 111 mg/dL — ABNORMAL HIGH (ref 0–99)
Triglycerides: 59 mg/dL (ref 0–149)
VLDL Cholesterol Cal: 11 mg/dL (ref 5–40)

## 2022-09-01 LAB — C-REACTIVE PROTEIN: CRP: 10 mg/L (ref 0–10)

## 2022-09-01 LAB — SEDIMENTATION RATE: Sed Rate: 16 mm/hr (ref 0–40)

## 2022-09-01 NOTE — Addendum Note (Signed)
Addended by: Brunetta Genera on: 09/01/2022 10:18 AM   Modules accepted: Orders

## 2022-09-09 ENCOUNTER — Other Ambulatory Visit
Admission: RE | Admit: 2022-09-09 | Discharge: 2022-09-09 | Disposition: A | Discharge status: Home / Self Care | Payer: Medicaid Other | Source: Ambulatory Visit | Attending: Family | Admitting: Family

## 2022-09-09 ENCOUNTER — Telehealth: Payer: Self-pay | Admitting: Licensed Clinical Social Worker

## 2022-09-09 DIAGNOSIS — I10 Essential (primary) hypertension: Secondary | ICD-10-CM

## 2022-09-09 LAB — BASIC METABOLIC PANEL
Anion gap: 13 (ref 5–15)
BUN: 38 mg/dL — ABNORMAL HIGH (ref 6–20)
CO2: 22 mmol/L (ref 22–32)
Calcium: 9.4 mg/dL (ref 8.9–10.3)
Chloride: 100 mmol/L (ref 98–111)
Creatinine, Ser: 1.05 mg/dL — ABNORMAL HIGH (ref 0.44–1.00)
GFR, Estimated: >60 mL/min (ref >60)
Glucose, Bld: 98 mg/dL (ref 70–99)
Potassium: 3.6 mmol/L (ref 3.5–5.1)
Sodium: 135 mmol/L (ref 135–145)

## 2022-09-09 LAB — MAGNESIUM: Magnesium: 2 mg/dL (ref 1.7–2.4)

## 2022-09-09 NOTE — Telephone Encounter (Signed)
H&V Care Navigation CSW Progress Note  Clinical Social Worker contacted patient by phone to f/u on housing challenges as I had not heard back from pt. Was scheduled to move out of their current housing 7/1. I was able to reach pt at [phone]. Re-introduced self, role, reason for call. Pt confirmed they are actively moving from previous house, were able to find something in her daughters budget at [address], [place], [address]. No additional concerns at this time.  Pt main concern during call is that her hands are cramping, reminded her to hydrate and recommended she call PCP office, I will also route to triage at [facility] for any additional recommendations if applicable.   Patient is participating in a Managed Medicaid Plan:  YesRolene Farley   SDOH Screenings   Food Insecurity: No Food Insecurity (08/31/2022)  Housing: Medium Risk (08/31/2022)  Transportation Needs: No Transportation Needs (11/19/2020)  Utilities: Not At Risk (08/31/2022)  Alcohol Screen: Low Risk  (11/19/2020)  Depression (PHQ2-9): Low Risk  (01/19/2020)  Financial Resource Strain: High Risk (08/31/2022)  Physical Activity: Inactive (11/19/2020)  Tobacco Use: Low Risk  (08/31/2022)    [name], MSW, LCSW Clinical Social Worker II [facility] Heart/Vascular Care Navigation  [phone]- work cell phone (preferred) [phone]- desk phone

## 2022-09-09 NOTE — Telephone Encounter (Signed)
Returned call to patient,   Reviewed the recommendations with the patient, she states she has been cramping really bad in her wrists, fingers and toes. Labs ordered patient will have done at [facility].   "Anticipate she may be a bit dehydrated from moving leading to hand cramping. Would recommend she increase hydration.   If she wishes, can order BMP/magnesium to assess electrolytes. Agree that primary care may be best place to address.    Alver Sorrow, NP"

## 2022-09-09 NOTE — Addendum Note (Signed)
Addended by: Marlene Lard on: 09/09/2022 11:28 AM   Modules accepted: Orders

## 2022-09-09 NOTE — Telephone Encounter (Signed)
Additional phone note opened in error.  Audrey Farley, MSW, LCSW Clinical Social Worker II [place] Heart/Vascular Care Navigation  [phone]- work cell phone (preferred) [phone]- desk phone  

## 2022-09-09 NOTE — Telephone Encounter (Signed)
Anticipate she may be a bit dehydrated from moving leading to hand cramping. Would recommend she increase hydration.  If she wishes, can order BMP/magnesium to assess electrolytes. Agree that primary care may be best place to address.   Alver Sorrow, NP

## 2022-09-14 ENCOUNTER — Encounter (HOSPITAL_BASED_OUTPATIENT_CLINIC_OR_DEPARTMENT_OTHER): Payer: Self-pay

## 2022-09-14 NOTE — Telephone Encounter (Signed)
Opened in error

## 2022-09-15 ENCOUNTER — Other Ambulatory Visit: Payer: Self-pay

## 2022-09-18 ENCOUNTER — Telehealth (HOSPITAL_BASED_OUTPATIENT_CLINIC_OR_DEPARTMENT_OTHER): Payer: Self-pay

## 2022-09-18 DIAGNOSIS — E785 Hyperlipidemia, unspecified: Secondary | ICD-10-CM

## 2022-09-18 NOTE — Telephone Encounter (Addendum)
Left message for patient to call back    ----- Message from Alver Sorrow sent at 09/18/2022  1:18 PM EDT ----- Cholesterol panel with LDL (bad cholesterol) of 111 which is above goal of less than 70. Recommend increase Atorvastatin to 40mg  daily with LFT/ FLP in 2-3 months.    Normal kidneys and electrolytes.  Stable liver function.  Normal sed rate and CRP indicating no inflammation. Good result!

## 2022-09-21 MED ORDER — ATORVASTATIN CALCIUM 40 MG PO TABS
40.0000 mg | ORAL_TABLET | Freq: Every day | ORAL | 3 refills | Status: AC
Start: 2022-09-21 — End: ?

## 2022-09-21 NOTE — Addendum Note (Signed)
Addended by: Marlene Lard on: 09/21/2022 11:04 AM   Modules accepted: Orders

## 2022-09-21 NOTE — Telephone Encounter (Signed)
2nd call attempt, Results called to patient who verbalizes understanding! Rx to pharm and labs mailed to patient.      ----- Message from Audrey Farley sent at 09/18/2022  1:18 PM EDT ----- Cholesterol panel with LDL (bad cholesterol) of 111 which is above goal of less than 70. Recommend increase Atorvastatin to 40mg  daily with LFT/ FLP in 2-3 months.      Normal kidneys and electrolytes.  Stable liver function.  Normal sed rate and CRP indicating no inflammation. Good result!

## 2022-10-26 ENCOUNTER — Telehealth: Payer: Self-pay

## 2022-10-26 NOTE — Telephone Encounter (Signed)
**Note De-Identified Audrey Farley Obfuscation** Audrey Shields, NP ordered a Home Sleep test for the pt at her office visit on 05/07/2022.  I called the pt and asked her if she is still wants/is willing to do the test and she stated that she is. I advised her that I will do a PA through hr insurance plan and if approved I will forward to the sleep lab and that someone from that dept will be calling her to arrange a time for her to pick up her Home Sleep Test.  She verbalized understanding and thanked me for my call.

## 2022-10-26 NOTE — Telephone Encounter (Signed)
**Note De-Identified Chyann Ambrocio Obfuscation** Home Sleep Study PA could not be done through the [employer] portal so I called [facility] and was advised by Esmeralda Links B. that a PA is not required. Reference #: [id number]  I have forwarded the pts Home Sleep Test order to the Sleep Lab with a message asking them to contact the pt to schedule.

## 2022-11-02 ENCOUNTER — Inpatient Hospital Stay: Payer: Managed Care, Other (non HMO) | Attending: Oncology

## 2022-11-02 DIAGNOSIS — Z8639 Personal history of other endocrine, nutritional and metabolic disease: Secondary | ICD-10-CM

## 2022-11-02 DIAGNOSIS — E538 Deficiency of other specified B group vitamins: Secondary | ICD-10-CM | POA: Insufficient documentation

## 2022-11-02 DIAGNOSIS — D649 Anemia, unspecified: Secondary | ICD-10-CM | POA: Insufficient documentation

## 2022-11-02 LAB — CBC
HCT: 34.9 % — ABNORMAL LOW (ref 36.0–46.0)
Hemoglobin: 11.3 g/dL — ABNORMAL LOW (ref 12.0–15.0)
MCH: 28.9 pg (ref 26.0–34.0)
MCHC: 32.4 g/dL (ref 30.0–36.0)
MCV: 89.3 fL (ref 80.0–100.0)
Platelets: 273 10*3/uL (ref 150–400)
RBC: 3.91 MIL/uL (ref 3.87–5.11)
RDW: 13.5 % (ref 11.5–15.5)
WBC: 5.2 10*3/uL (ref 4.0–10.5)
nRBC: 0 % (ref 0.0–0.2)

## 2022-11-02 LAB — IRON AND TIBC
Iron: 73 ug/dL (ref 28–170)
Saturation Ratios: 21 % (ref 10.4–31.8)
TIBC: 347 ug/dL (ref 250–450)
UIBC: 274 ug/dL

## 2022-11-02 LAB — FERRITIN: Ferritin: 259 ng/mL (ref 11–307)

## 2022-11-02 LAB — VITAMIN B12: Vitamin B-12: 208 pg/mL (ref 180–914)

## 2022-11-03 ENCOUNTER — Telehealth: Payer: Self-pay | Admitting: *Deleted

## 2022-11-03 NOTE — Telephone Encounter (Signed)
Called pt to let her know that her b12 level is low. I asked if she is till taking the b12 pills and she run out and it has been a while she said. She wanted to know about the ferritin it is great. She is agreeable to have a b12 inj Thursday of this week. I made her appt while on the phone and she is agreeable.

## 2022-11-03 NOTE — Telephone Encounter (Signed)
-----   Message from Creig Hines sent at 11/03/2022  8:56 AM EDT ----- Is she on b12 po or injections. She needs monthly injections

## 2022-11-04 ENCOUNTER — Other Ambulatory Visit: Payer: Self-pay | Admitting: Oncology

## 2022-11-05 ENCOUNTER — Inpatient Hospital Stay: Payer: Managed Care, Other (non HMO)

## 2022-11-05 DIAGNOSIS — E538 Deficiency of other specified B group vitamins: Secondary | ICD-10-CM | POA: Diagnosis not present

## 2022-11-05 MED ORDER — CYANOCOBALAMIN 1000 MCG/ML IJ SOLN
1000.0000 ug | INTRAMUSCULAR | Status: DC
  Administered 2022-11-05: 1000 ug via INTRAMUSCULAR
  Filled 2022-11-05: qty 1

## 2022-11-06 LAB — MULTIPLE MYELOMA PANEL, SERUM
Albumin SerPl Elph-Mcnc: 3.7 g/dL (ref 2.9–4.4)
Albumin/Glob SerPl: 1.3 (ref 0.7–1.7)
Alpha 1: 0.3 g/dL (ref 0.0–0.4)
Alpha2 Glob SerPl Elph-Mcnc: 0.7 g/dL (ref 0.4–1.0)
B-Globulin SerPl Elph-Mcnc: 0.9 g/dL (ref 0.7–1.3)
Gamma Glob SerPl Elph-Mcnc: 1 g/dL (ref 0.4–1.8)
Globulin, Total: 2.9 g/dL (ref 2.2–3.9)
IgA: 87 mg/dL (ref 87–352)
IgG (Immunoglobin G), Serum: 972 mg/dL (ref 586–1602)
IgM (Immunoglobulin M), Srm: 140 mg/dL (ref 26–217)
M Protein SerPl Elph-Mcnc: 0.2 g/dL — ABNORMAL HIGH
Total Protein ELP: 6.6 g/dL (ref 6.0–8.5)

## 2022-11-26 ENCOUNTER — Encounter: Payer: Self-pay | Admitting: Oncology

## 2022-11-26 ENCOUNTER — Encounter (HOSPITAL_BASED_OUTPATIENT_CLINIC_OR_DEPARTMENT_OTHER): Payer: Self-pay | Admitting: Family

## 2022-11-26 ENCOUNTER — Ambulatory Visit (HOSPITAL_BASED_OUTPATIENT_CLINIC_OR_DEPARTMENT_OTHER): Payer: Managed Care, Other (non HMO) | Admitting: Family

## 2022-11-26 VITALS — BP 162/93 | HR 61 | Ht 63.5 in | Wt 239.2 lb

## 2022-11-26 DIAGNOSIS — I25118 Atherosclerotic heart disease of native coronary artery with other forms of angina pectoris: Secondary | ICD-10-CM

## 2022-11-26 DIAGNOSIS — I10 Essential (primary) hypertension: Secondary | ICD-10-CM | POA: Diagnosis not present

## 2022-11-26 DIAGNOSIS — E785 Hyperlipidemia, unspecified: Secondary | ICD-10-CM

## 2022-11-26 NOTE — Progress Notes (Signed)
Advanced Hypertension Clinic  Assessment:    Date:  11/26/2022   ID:  Audrey Farley, DOB [birth year], MRN [id number]  PCP:  Armando Gang, FNP  Cardiologist:  Chilton Si, MD  Nephrologist:  Referring MD: Franciso Bend, NP   CC: Hypertension  History of Present Illness:    Audrey Farley is a 62 y.o. female with a hx of  with a hx of HTN, GERD, HLD, nonobstructive CAD.   Exercise Myoview in 2017 with EF 53% with no ischemia.  She achieved 8 METS on Bruce protocol.  Echo 02/2016 EF 55 to 60%, mild dilation ascending aorta 3.9 cm.  Stress test 06/2019 EF 70% with no ischemia.  She was started on a diltiazem due to elevated blood pressure.  Repeat echo 06/2019 LVEF 60 to 65%, mild LVH, diastolic function indeterminate, ascending aorta normal in size.   Seen 08/2020 for acute visit for chest pain.  Coronary CTA with minimal nonobstructive disease.  Calcium score placing her in the 96 percentile.  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  Underwent repeat knee surgery which was performed 06/2021.   Seen 04/08/2022 and due to persistent chest pain recommended for CAD.  Diltiazem was transitioned to amlodipine for improved blood pressure control.  She was referred to the advanced hypertension clinic for evaluation of secondary causes. LHC 04/13/22 mild nonobstructive coronary disease.  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  Echo 04/23/2022 to assess LVEF revealed LVEF 60 to 65%, no RWMA, mild MR.   She was seen 05/07/2022 to establish with advanced hypertension clinic.  For simplification of regimen telmisartan-HCTZ, HCTZ, amlodipine were discontinued and she was instead started on amlodipine-valsartan-HCTZ.  Unfortunately the pharmacy did not make [place] aware that it required a prior authorization so she was unable to start until earlier this month.  Renal duplex 05/21/2022 with no  renal artery stenosis.  Cortisol, catecholamines, metanephrines were unremarkable.  Home sleep study ordered but not yet performed.   She presents today to follow-up in advanced hypertension clinic.  Unfortunately she has had difficulty getting combination blood pressure tablet from her pharmacy.  She has not resumed her telmisartan-amlodipine or hydrochlorothiazide and as such her blood pressure has been elevated persistently in the 160s-170s.  Encouraged her to contact our office should this ever recur.  She reports no chest pain, dyspnea.  Last seen 08/31/22 by Dr. Duke Salvia with elevated BP in the setting of self discontinuing her medication. However, she was having hypotensive readings at home. Amlodipine-Valsartan-hydrochlorothiazide was resumed at lower dose of 10-160-25.     Presents today for follow-up independently.  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  Has repeat knee arthroplasty scheduled 02/16/2023. BP at home has been labile. 123-160s. Most often 140-150s. No recurrent hypotension. Occasional sharp chest pain at night that is overall less frequent than previous. Reports some exertional dyspnea which is unchanged. She does get a bit short of breath during her physical therapy but is able to continue her exercises.Taking her amlodipine-valsartan-hydrochlorothiazide every evening. She has not needed her PRN diuretic. Home sleep study scheduled for 12/11/22   Past Medical History:  Diagnosis Date   Allergy    Aneurysm of ascending aorta (HCC) 02/25/2016   3.9cm by echo 02/2016  -aorta enlarged   Arthritis    CAD in native artery 11/19/2020   Chest pain 01/24/2016  Dyspnea on exertion    HLD (hyperlipidemia)    Hypertension    Urge incontinence     Past Surgical History:  Procedure Laterality Date   ABDOMINAL HYSTERECTOMY     still has cervix; DUB; ovaries intact.   COLONOSCOPY WITH PROPOFOL N/A 11/03/2021    Procedure: COLONOSCOPY WITH PROPOFOL;  Surgeon: Toney Reil, MD;  Location: [facility] ENDOSCOPY;  Service: Gastroenterology;  Laterality: N/A;   ESOPHAGOGASTRODUODENOSCOPY (EGD) WITH PROPOFOL N/A 11/03/2021   Procedure: ESOPHAGOGASTRODUODENOSCOPY (EGD) WITH PROPOFOL;  Surgeon: Toney Reil, MD;  Location: [facility] ENDOSCOPY;  Service: Gastroenterology;  Laterality: N/A;   KNEE ARTHROSCOPY Left 2012   LEFT HEART CATH AND CORONARY ANGIOGRAPHY N/A 04/13/2022   Procedure: LEFT HEART CATH AND CORONARY ANGIOGRAPHY;  Surgeon: Yvonne Kendall, MD;  Location: [facility] INVASIVE CV LAB;  Service: Cardiovascular;  Laterality: N/A;   TOTAL KNEE ARTHROPLASTY Left 09/28/2016   TOTAL KNEE ARTHROPLASTY Left 09/28/2016   Procedure: LEFT TOTAL KNEE ARTHROPLASTY;  Surgeon: Dannielle Huh, MD;  Location: [facility] OR;  Service: Orthopedics;  Laterality: Left;    Current Medications: Current Meds  Medication Sig   acetaminophen (TYLENOL) 500 MG tablet Take by mouth.   albuterol (VENTOLIN HFA) 108 (90 Base) MCG/ACT inhaler Inhale 2 puffs into the lungs every 4 (four) hours as needed for wheezing or shortness of breath.   amLODIPine-Valsartan-HCTZ 10-160-25 MG TABS Take 1 tablet by mouth daily.   ascorbic acid (VITAMIN C) 250 MG tablet Take 1 tablet by mouth daily.   aspirin EC 81 MG tablet Take 81 mg by mouth daily. Swallow whole.   atorvastatin (LIPITOR) 40 MG tablet Take 1 tablet (40 mg total) by mouth daily.   benzonatate (TESSALON) 100 MG capsule Take by mouth.   celecoxib (CELEBREX) 200 MG capsule Take by mouth.   cholecalciferol (VITAMIN D3) 25 MCG (1000 UNIT) tablet Take 1,000 Units by mouth daily.   CREON 36000-114000 units CPEP capsule Take by mouth.   ezetimibe (ZETIA) 10 MG tablet Take 1 tablet (10 mg total) by mouth daily.   famotidine (PEPCID) 20 MG tablet Take 20 mg by mouth at bedtime.   ferrous sulfate 325 (65 FE) MG EC tablet Take by mouth.   folic acid (FOLVITE) 1 MG tablet Take 1 mg by mouth daily.    HYDROcodone-acetaminophen (NORCO/VICODIN) 5-325 MG tablet Take 1 tablet by mouth every 4 (four) hours as needed.   iron sucrose in sodium chloride 0.9 % 100 mL Take 1 capsule by mouth daily.   meloxicam (MOBIC) 15 MG tablet Take 15 mg by mouth as needed for pain.   methocarbamol (ROBAXIN) 500 MG tablet Take by mouth.   nortriptyline (PAMELOR) 10 MG capsule Take by mouth.   oxyCODONE (OXY IR/ROXICODONE) 5 MG immediate release tablet Take 5-10 mg by mouth every 4 (four) hours as needed.   pantoprazole (PROTONIX) 40 MG tablet Take 1 tablet (40 mg total) by mouth daily before breakfast.   pregabalin (LYRICA) 25 MG capsule Take 25 mg twice a day for a week then increase to 50 mg twice a day and continue that dose   senna-docusate (SENOKOT-S) 8.6-50 MG tablet Take by mouth.   vitamin B-12 (CYANOCOBALAMIN) 1000 MCG tablet Take 1,000 mcg by mouth daily.   Current Facility-Administered Medications for the 11/26/22 encounter (Office Visit) with Alver Sorrow, NP  Medication   sodium chloride flush (NS) 0.9 % injection 3 mL     Allergies:   Celecoxib, Mirabegron, and Latex   Social History   Socioeconomic  History   Marital status: Single    Spouse name: Not on file   Number of children: Not on file   Years of education: Not on file   Highest education level: Not on file  Occupational History   Not on file  Tobacco Use   Smoking status: Never   Smokeless tobacco: Never  Vaping Use   Vaping status: Never Used  Substance and Sexual Activity   Alcohol use: Yes    Comment: occ vodka.NONE LAST 24HRS   Drug use: No   Sexual activity: Yes    Birth control/protection: Surgical, Post-menopausal  Other Topics Concern   Not on file  Social History Narrative   Marital status: single; not dating since 2014      Children: 2 daughters (16, 1); no grandchildren      Lives: with 2 daughters       Employment: tobacco company; Agricultural consultant x 16 years; loves job!  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        Tobacco: none      Alcohol; weekends/socially      Drugs: none      Exercise:  Two days per week; cardio      Seatbelt: 100%; no texting      Sexual activity: not sexually active since 2014; dates males; no STDs; total partners = 7.   Social Determinants of Health   Financial Resource Strain: High Risk (08/31/2022)   Overall Financial Resource Strain (CARDIA)    Difficulty of Paying Living Expenses: Very hard  Food Insecurity: Low Risk  (11/11/2022)   Received from [facility]   Hunger Vital Sign    Worried About Running Out of Food in the Last Year: Never true    Ran Out of Food in the Last Year: Never true  Transportation Needs: No Transportation Needs (11/11/2022)   Received from [facility]    In the past 12 months, has lack of reliable transportation kept you from medical appointments, meetings, work or from getting things needed for daily living? : No  Physical Activity: Inactive (11/19/2020)   Exercise Vital Sign    Days of Exercise per Week: 0 days    Minutes of Exercise per Session: 0 min  Stress: Not on file  Social Connections: Unknown (07/22/2021)   Received from [facility]   Social Network    Social Network: Not on file     Family History: The patient's family history includes Diabetes in her mother and sister; Epilepsy in her brother; Heart attack in her mother; Heart disease in her mother; Hyperlipidemia in her mother and sister; Hypertension in her mother and sister; Kidney disease in her mother; Mental illness in her brother; Multiple sclerosis in her sister. There is no history of Sudden death.  ROS:   Please see the history of present illness.     All other systems reviewed and are negative.  EKGs/Labs/Other Studies Reviewed:    EKG Interpretation Date/Time:  Thursday November 26 2022 09:53:17 EDT Ventricular Rate:  61 PR Interval:  152 QRS Duration:  80 QT Interval:  420 QTC  Calculation: 422 R Axis:   14  Text Interpretation: Normal sinus rhythm Normal ECG Confirmed by Gillian Shields ([id number]) on 11/26/2022 9:59:45 AM    Recent Labs: 05/21/2022: TSH 2.620 08/31/2022: ALT 13 09/09/2022: BUN 38; Creatinine, Ser 1.05; Magnesium 2.0; Potassium 3.6; Sodium 135 11/02/2022: Hemoglobin 11.3; Platelets 273   Recent Lipid Panel  Component Value Date/Time   CHOL 212 (H) 08/31/2022 1029   TRIG 59 08/31/2022 1029   HDL 90 08/31/2022 1029   CHOLHDL 2.4 08/31/2022 1029   CHOLHDL 2.1 12/20/2015 1123   VLDL 9 12/20/2015 1123   LDLCALC 111 (H) 08/31/2022 1029    Physical Exam:   VS:  BP (!) 162/93 (BP Location: Right Arm, Patient Position: Sitting, Cuff Size: Large)   Pulse 61   Ht 5' 3.5" (1.613 m)   Wt 239 lb 3.2 oz (108.5 kg)   SpO2 98%   BMI 41.71 kg/m  , BMI Body mass index is 41.71 kg/m. GENERAL:  Well appearing HEENT: Pupils equal round and reactive, fundi not visualized, oral mucosa unremarkable NECK:  No jugular venous distention, waveform within normal limits, carotid upstroke brisk and symmetric, no bruits, no thyromegaly LYMPHATICS:  No cervical adenopathy LUNGS:  Clear to auscultation bilaterally HEART:  RRR.  PMI not displaced or sustained,S1 and S2 within normal limits, no S3, no S4, no clicks, no rubs, no murmurs ABD:  Flat, positive bowel sounds normal in frequency in pitch, no bruits, no rebound, no guarding, no midline pulsatile mass, no hepatomegaly, no splenomegaly EXT:  2 plus pulses throughout, no edema, no cyanosis no clubbing SKIN:  No rashes no nodules NEURO:  Cranial nerves II through XII grossly intact, motor grossly intact throughout PSYCH:  Cognitively intact, oriented to person place and time   ASSESSMENT/PLAN:    HTN -BP elevated in clinic but reports readingsmost often SBP 140-150s at home. Amlodipine-Olmesartan-hydrochlorothiazide previously reduced to 10-160-25 due to hypotension. Will move to morning dosing for better  consistently in timing and check in via MyChart message in 2 weeks.  If BP routinely elevated at home consider return to 10-320-25mg  dose versus addition of low dose Spironolactone 12.5mg  daily.   Nonobstructive CAD / HLC-nonobstructive CAD by [facility] 04/13/2022.  Stable with no anginal symptoms. No indication for ischemic evaluation.  Occasional sharp chest discomfort at rest atypical for anginal, likely etiology muscle spasm.  Reassurance provided.  GDMT includes aspirin, atorvastatin.   Screening for Secondary Hypertension:     05/07/2022    9:25 AM  Causes  Renovascular HTN Screened  Sleep Apnea Screened  Thyroid Disease Screened  Hyperaldosteronism Not Screened     - Comments Hesitant to hold ARB  Pheochromocytoma Screened  Cushing's Syndrome Screened  Hyperparathyroidism Screened    Relevant Labs/Studies:    Latest Ref Rng & Units 09/09/2022    3:27 PM 08/31/2022   10:29 AM 05/21/2022    9:25 AM  Basic Labs  Sodium 135 - 145 mmol/L 135  141  143   Potassium 3.5 - 5.1 mmol/L 3.6  4.1  4.1   Creatinine 0.44 - 1.00 mg/dL 3.47  4.25  9.56        Latest Ref Rng & Units 05/21/2022    9:25 AM 05/24/2017    5:31 PM  Thyroid   TSH 0.450 - 4.500 uIU/mL 2.620  1.240           Latest Ref Rng & Units 05/21/2022    9:25 AM  Metanephrines/Catecholamines   Epinephrine 0 - 62 pg/mL 26   Norepinephrine 0 - 874 pg/mL 652   Dopamine 0 - 48 pg/mL 49   Metanephrines 0.0 - 88.0 pg/mL <25.0   Normetanephrines  0.0 - 244.0 pg/mL 71.6           05/21/2022    9:28 AM  Renovascular   Renal Artery [place] Completed Yes  Disposition:    FU with MD/PharmD in 3 months    Medication Adjustments/Labs and Tests Ordered: Current medicines are reviewed at length with the patient today.  Concerns regarding medicines are outlined above.  Orders Placed This Encounter  Procedures   EKG 12-Lead   No orders of the defined types were placed in this encounter.    Signed, Alver Sorrow, NP   11/26/2022 10:04 AM    [facility]

## 2022-11-26 NOTE — Patient Instructions (Signed)
Medication Instructions:  Move your Amlodipine-Valsartan-hydrochlorothiazide to the morning    Follow-Up: In 3 months in Hypertension Clinic    Special Instructions:  We will send you a message in 2 weeks via MyChart to check in on your blood pressure

## 2022-11-30 ENCOUNTER — Encounter (HOSPITAL_BASED_OUTPATIENT_CLINIC_OR_DEPARTMENT_OTHER): Payer: Self-pay | Admitting: Family

## 2022-12-07 ENCOUNTER — Inpatient Hospital Stay: Payer: Managed Care, Other (non HMO) | Attending: Oncology

## 2022-12-07 DIAGNOSIS — E538 Deficiency of other specified B group vitamins: Secondary | ICD-10-CM | POA: Diagnosis present

## 2022-12-07 MED ORDER — CYANOCOBALAMIN 1000 MCG/ML IJ SOLN
1000.0000 ug | INTRAMUSCULAR | Status: DC
  Administered 2022-12-07: 1000 ug via INTRAMUSCULAR
  Filled 2022-12-07: qty 1

## 2022-12-11 ENCOUNTER — Ambulatory Visit (HOSPITAL_BASED_OUTPATIENT_CLINIC_OR_DEPARTMENT_OTHER): Payer: Managed Care, Other (non HMO) | Attending: Family | Admitting: Cardiology

## 2022-12-11 DIAGNOSIS — G4730 Sleep apnea, unspecified: Secondary | ICD-10-CM | POA: Diagnosis not present

## 2022-12-11 DIAGNOSIS — R0683 Snoring: Secondary | ICD-10-CM | POA: Insufficient documentation

## 2022-12-11 DIAGNOSIS — I1A0 Resistant hypertension: Secondary | ICD-10-CM | POA: Insufficient documentation

## 2022-12-11 DIAGNOSIS — G4733 Obstructive sleep apnea (adult) (pediatric): Secondary | ICD-10-CM | POA: Diagnosis not present

## 2022-12-17 NOTE — Procedures (Signed)
   Patient Name: Audrey Farley, Glymph Date: 12/13/2022 Gender: Female D.O.B: September 12, 1960 Age (years): 55 Referring Provider: Alver Sorrow NP Height (inches): 64 Interpreting Physician: Armanda Magic MD, ABSM Weight (lbs): 238 RPSGT: [place] BMI: 41 MRN: [id number] Neck Size: 15.50  CLINICAL INFORMATION Sleep Study Type: HST  Indication for sleep study: N/A  Epworth Sleepiness Score: 7  SLEEP STUDY TECHNIQUE A multi-channel overnight portable sleep study was performed. The channels recorded were: nasal airflow, thoracic respiratory movement, and oxygen saturation with a pulse oximetry. Snoring was also monitored.  MEDICATIONS Patient self administered medications include: N/A.  SLEEP ARCHITECTURE Patient was studied for 463.9 minutes. The sleep efficiency was 100.0 % and the patient was supine for 0%. The arousal index was 0.0 per hour.  RESPIRATORY PARAMETERS The overall AHI was 14.1 per hour, with a central apnea index of 0 per hour.  The oxygen nadir was 89% during sleep.  CARDIAC DATA Mean heart rate during sleep was 64.5 bpm.  IMPRESSIONS - Mild obstructive sleep apnea occurred during this study (AHI = 14.1/h). - Mild oxygen desaturation was noted during this study (Min O2 = 89%). - Patient snored 0.4% during the sleep.  DIAGNOSIS - Obstructive Sleep Apnea (G47.33)  RECOMMENDATIONS - Therapeutic CPAP titration to determine optimal pressure required to alleviate sleep disordered breathing. - Oral appliance may be considered. - Avoid alcohol, sedatives and other CNS depressants that may worsen sleep apnea and disrupt normal sleep architecture. - Sleep hygiene should be reviewed to assess factors that may improve sleep quality. - Weight management and regular exercise should be initiated or continued.  [Electronically signed] 12/17/2022 11:05 PM  Armanda Magic MD, ABSM Diplomate, American Board of Sleep Medicine

## 2022-12-22 ENCOUNTER — Telehealth: Payer: Self-pay

## 2022-12-22 DIAGNOSIS — G4733 Obstructive sleep apnea (adult) (pediatric): Secondary | ICD-10-CM

## 2022-12-22 NOTE — Telephone Encounter (Signed)
Notified patient of sleep study results and recommendations. All questions were answered and patient verbalized understanding. CPAP Titration ordered today 12/22/22

## 2022-12-22 NOTE — Telephone Encounter (Signed)
-----   Message from Armanda Magic sent at 12/17/2022 11:06 PM EDT ----- Please let patient know that they have sleep apnea.  Recommend therapeutic CPAP titration for treatment of patient's sleep disordered breathing.  If unable to perform an in lab titration then initiate ResMed auto CPAP from 4 to 15cm H2O with heated humidity and mask of choice and overnight pulse ox on CPAP.

## 2022-12-25 ENCOUNTER — Other Ambulatory Visit: Payer: Self-pay | Admitting: Obstetrics and Gynecology

## 2022-12-25 DIAGNOSIS — R2232 Localized swelling, mass and lump, left upper limb: Secondary | ICD-10-CM

## 2023-01-06 ENCOUNTER — Inpatient Hospital Stay: Payer: Managed Care, Other (non HMO) | Attending: Oncology

## 2023-01-06 DIAGNOSIS — E538 Deficiency of other specified B group vitamins: Secondary | ICD-10-CM | POA: Insufficient documentation

## 2023-01-06 MED ORDER — CYANOCOBALAMIN 1000 MCG/ML IJ SOLN
1000.0000 ug | INTRAMUSCULAR | Status: DC
  Administered 2023-01-06: 1000 ug via INTRAMUSCULAR
  Filled 2023-01-06: qty 1

## 2023-01-07 ENCOUNTER — Telehealth: Payer: Self-pay

## 2023-01-07 NOTE — Telephone Encounter (Signed)
**Note De-Identified Sadi Arave Obfuscation** CPAP Titration PA started through the [employer] provider portal: This case will be reviewed by a member of the [employer] within 48 hours of submission. Please revisit your account to check the status of this case. The prior authorization you submitted, Case [id number], has been received. Additional case status notifications will be sent if you opted in for email notifications. Thank you.

## 2023-01-08 ENCOUNTER — Ambulatory Visit
Admission: RE | Admit: 2023-01-08 | Discharge: 2023-01-08 | Disposition: A | Discharge status: Home / Self Care | Payer: Managed Care, Other (non HMO) | Source: Ambulatory Visit | Attending: Obstetrics and Gynecology | Admitting: Obstetrics and Gynecology

## 2023-01-08 DIAGNOSIS — R2232 Localized swelling, mass and lump, left upper limb: Secondary | ICD-10-CM

## 2023-01-08 NOTE — Telephone Encounter (Signed)
**Note De-Identified Taylah Dubiel Obfuscation** Updated: Authorization Number: NA Case Number: [id number]     Health Plan Auth Number: N/A Patient Name: Audrey Farley, Audrey Farley DOB: Jul 24, 1960 Status: Pending Medical Director Review

## 2023-02-03 ENCOUNTER — Inpatient Hospital Stay: Payer: Medicaid Other | Attending: Oncology

## 2023-02-03 DIAGNOSIS — E538 Deficiency of other specified B group vitamins: Secondary | ICD-10-CM | POA: Diagnosis present

## 2023-02-03 MED ORDER — CYANOCOBALAMIN 1000 MCG/ML IJ SOLN
1000.0000 ug | INTRAMUSCULAR | Status: DC
  Administered 2023-02-03: 1000 ug via INTRAMUSCULAR
  Filled 2023-02-03: qty 1

## 2023-02-11 ENCOUNTER — Encounter (HOSPITAL_BASED_OUTPATIENT_CLINIC_OR_DEPARTMENT_OTHER): Payer: Managed Care, Other (non HMO) | Admitting: Family

## 2023-02-26 ENCOUNTER — Encounter: Payer: Self-pay | Admitting: Oncology

## 2023-03-05 ENCOUNTER — Inpatient Hospital Stay: Payer: Medicare Other | Attending: Oncology

## 2023-03-05 DIAGNOSIS — E538 Deficiency of other specified B group vitamins: Secondary | ICD-10-CM | POA: Diagnosis present

## 2023-03-05 MED ORDER — CYANOCOBALAMIN 1000 MCG/ML IJ SOLN
1000.0000 ug | INTRAMUSCULAR | Status: DC
  Administered 2023-03-05: 1000 ug via INTRAMUSCULAR

## 2023-03-19 ENCOUNTER — Encounter: Payer: Self-pay | Admitting: Oncology

## 2023-03-19 ENCOUNTER — Other Ambulatory Visit: Payer: Self-pay

## 2023-03-22 ENCOUNTER — Other Ambulatory Visit: Payer: Self-pay

## 2023-03-23 ENCOUNTER — Other Ambulatory Visit: Payer: Self-pay

## 2023-03-29 ENCOUNTER — Encounter: Payer: Self-pay | Admitting: Oncology

## 2023-04-05 ENCOUNTER — Inpatient Hospital Stay: Payer: Medicare Other | Attending: Oncology

## 2023-04-05 DIAGNOSIS — E538 Deficiency of other specified B group vitamins: Secondary | ICD-10-CM | POA: Diagnosis present

## 2023-04-05 MED ORDER — CYANOCOBALAMIN 1000 MCG/ML IJ SOLN
1000.0000 ug | INTRAMUSCULAR | Status: DC
  Administered 2023-04-05: 1000 ug via INTRAMUSCULAR
  Filled 2023-04-05: qty 1

## 2023-04-16 ENCOUNTER — Ambulatory Visit: Admission: RE | Admit: 2023-04-16 | Payer: Medicare Other | Source: Ambulatory Visit

## 2023-05-04 ENCOUNTER — Other Ambulatory Visit: Payer: Self-pay

## 2023-05-04 DIAGNOSIS — E538 Deficiency of other specified B group vitamins: Secondary | ICD-10-CM

## 2023-05-04 DIAGNOSIS — D649 Anemia, unspecified: Secondary | ICD-10-CM

## 2023-05-05 ENCOUNTER — Encounter: Payer: Self-pay | Admitting: Oncology

## 2023-05-05 ENCOUNTER — Inpatient Hospital Stay (HOSPITAL_BASED_OUTPATIENT_CLINIC_OR_DEPARTMENT_OTHER): Payer: Medicare Other | Admitting: Oncology

## 2023-05-05 ENCOUNTER — Inpatient Hospital Stay: Payer: Medicare Other

## 2023-05-05 ENCOUNTER — Inpatient Hospital Stay: Payer: Medicare Other | Attending: Oncology

## 2023-05-05 VITALS — BP 122/85 | HR 66 | Temp 97.2°F | Resp 19 | Wt 251.6 lb

## 2023-05-05 DIAGNOSIS — D649 Anemia, unspecified: Secondary | ICD-10-CM

## 2023-05-05 DIAGNOSIS — D509 Iron deficiency anemia, unspecified: Secondary | ICD-10-CM | POA: Insufficient documentation

## 2023-05-05 DIAGNOSIS — D472 Monoclonal gammopathy: Secondary | ICD-10-CM | POA: Insufficient documentation

## 2023-05-05 DIAGNOSIS — Z79899 Other long term (current) drug therapy: Secondary | ICD-10-CM | POA: Insufficient documentation

## 2023-05-05 DIAGNOSIS — Z8639 Personal history of other endocrine, nutritional and metabolic disease: Secondary | ICD-10-CM

## 2023-05-05 DIAGNOSIS — E538 Deficiency of other specified B group vitamins: Secondary | ICD-10-CM

## 2023-05-05 LAB — CBC
HCT: 38.3 % (ref 36.0–46.0)
Hemoglobin: 12.9 g/dL (ref 12.0–15.0)
MCH: 28.7 pg (ref 26.0–34.0)
MCHC: 33.7 g/dL (ref 30.0–36.0)
MCV: 85.1 fL (ref 80.0–100.0)
Platelets: 279 10*3/uL (ref 150–400)
RBC: 4.5 MIL/uL (ref 3.87–5.11)
RDW: 12.7 % (ref 11.5–15.5)
WBC: 5.3 10*3/uL (ref 4.0–10.5)
nRBC: 0 % (ref 0.0–0.2)

## 2023-05-05 LAB — IRON AND TIBC
Iron: 110 ug/dL (ref 28–170)
Saturation Ratios: 29 % (ref 10.4–31.8)
TIBC: 384 ug/dL (ref 250–450)
UIBC: 274 ug/dL

## 2023-05-05 LAB — FERRITIN: Ferritin: 192 ng/mL (ref 11–307)

## 2023-05-05 LAB — VITAMIN B12: Vitamin B-12: 385 pg/mL (ref 180–914)

## 2023-05-05 MED ORDER — SYRINGE 25G X 1" 3 ML MISC: 1.0000 mL | 0 refills | Status: AC

## 2023-05-05 MED ORDER — CYANOCOBALAMIN 1000 MCG/ML IJ SOLN
1000.0000 ug | INTRAMUSCULAR | Status: DC
  Administered 2023-05-05: 1000 ug via INTRAMUSCULAR
  Filled 2023-05-05: qty 1

## 2023-05-05 NOTE — Progress Notes (Signed)
 Hematology/Oncology Consult note [facility]  [phone] [phone]  Patient Care Team: Armando Gang, FNP as PCP - General (Family Medicine) Chilton Si, MD as PCP - Cardiology (Cardiology) Creig Hines, MD as Consulting Physician (Hematology and Oncology) Alver Sorrow, NP as Nurse Practitioner (Cardiology)   Name of the patient: Audrey Farley  [id number]  1960/04/30   Date of visit: 05/05/23  Diagnosis- normocytic anemia likely multifactorial secondary to chronic disease as well as component of iron and B12 deficiency     Chief complaint/ Reason for visit-routine follow-up of anemia  Heme/Onc history: patient is a 63 year old African-American female with a past medical history significant for hypertension hyperlipidemia GERD among other medical problems.  She has been referred to [place] for anemia.Most recent CBC from November 2022 showed a hemoglobin of 11 that was normocytic.  Prior to that her hemoglobin was 10 in July 2022.  B12 levels low at 197 and folate levels which were previously abnormal in July normalized to 7.9.  Serum iron low at 20 and iron saturation 8%.  Patient denies any family history of cancer.  Denies any blood loss in her stool or urine.  Denies any dark melanotic stool.  Denies any consistent use of NSAIDs.  Results of anemia workup showed 0.2 g of IgG lambda M protein.  Free light chain ratio normal.  No evidence of hemolysis.  B12 levels normal   Interval history-she has occasional midsternal chest pain.  She has seen cardiology for this and cardiac causes were ruled out.  Denies any significant change in her appetite or weight  ECOG PS- 1 Pain scale- 0   Review of systems- Review of Systems  Constitutional:  Negative for chills, fever, malaise/fatigue and weight loss.  HENT:  Negative for congestion, ear discharge and nosebleeds.   Eyes:  Negative for blurred vision.  Respiratory:  Negative  for cough, hemoptysis, sputum production, shortness of breath and wheezing.   Cardiovascular:  Negative for chest pain, palpitations, orthopnea and claudication.  Gastrointestinal:  Negative for abdominal pain, blood in stool, constipation, diarrhea, heartburn, melena, nausea and vomiting.  Genitourinary:  Negative for dysuria, flank pain, frequency, hematuria and urgency.  Musculoskeletal:  Negative for back pain, joint pain and myalgias.  Skin:  Negative for rash.  Neurological:  Negative for dizziness, tingling, focal weakness, seizures, weakness and headaches.  Endo/Heme/Allergies:  Does not bruise/bleed easily.  Psychiatric/Behavioral:  Negative for depression and suicidal ideas. The patient does not have insomnia.       Allergies  Allergen Reactions   Celecoxib Nausea Only   Mirabegron Nausea Only   Prunus Persica Hives   Latex Itching     Past Medical History:  Diagnosis Date   Allergy    Aneurysm of ascending aorta (HCC) 02/25/2016   3.9cm by echo 02/2016  -aorta enlarged   Arthritis    CAD in native artery 11/19/2020   Chest pain 01/24/2016   Dyspnea on exertion    HLD (hyperlipidemia)    Hypertension    Urge incontinence      Past Surgical History:  Procedure Laterality Date   ABDOMINAL HYSTERECTOMY     still has cervix; DUB; ovaries intact.   COLONOSCOPY WITH PROPOFOL N/A 11/03/2021   Procedure: COLONOSCOPY WITH PROPOFOL;  Surgeon: Toney Reil, MD;  Location: [facility];  Service: Gastroenterology;  Laterality: N/A;   ESOPHAGOGASTRODUODENOSCOPY (EGD) WITH PROPOFOL N/A 11/03/2021   Procedure: ESOPHAGOGASTRODUODENOSCOPY (EGD) WITH PROPOFOL;  Surgeon: Lannette Donath  Betti Cruz, MD;  Location: [facility] ENDOSCOPY;  Service: Gastroenterology;  Laterality: N/A;   KNEE ARTHROSCOPY Left 2012   LEFT HEART CATH AND CORONARY ANGIOGRAPHY N/A 04/13/2022   Procedure: LEFT HEART CATH AND CORONARY ANGIOGRAPHY;  Surgeon: Yvonne Kendall, MD;  Location: [facility] INVASIVE CV LAB;   Service: Cardiovascular;  Laterality: N/A;   TOTAL KNEE ARTHROPLASTY Left 09/28/2016   TOTAL KNEE ARTHROPLASTY Left 09/28/2016   Procedure: LEFT TOTAL KNEE ARTHROPLASTY;  Surgeon: Dannielle Huh, MD;  Location: [facility] OR;  Service: Orthopedics;  Laterality: Left;    Social History   Socioeconomic History   Marital status: Single    Spouse name: Not on file   Number of children: Not on file   Years of education: Not on file   Highest education level: Not on file  Occupational History   Not on file  Tobacco Use   Smoking status: Never   Smokeless tobacco: Never  Vaping Use   Vaping status: Never Used  Substance and Sexual Activity   Alcohol use: Yes    Comment: occ vodka.NONE LAST 24HRS   Drug use: No   Sexual activity: Yes    Birth control/protection: Surgical, Post-menopausal  Other Topics Concern   Not on file  Social History Narrative   Marital status: single; not dating since 2014      Children: 2 daughters (16, 62); no grandchildren      Lives: with 2 daughters       Employment: tobacco company; Agricultural consultant x 16 years; loves job!  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        Tobacco: none      Alcohol; weekends/socially      Drugs: none      Exercise:  Two days per week; cardio      Seatbelt: 100%; no texting      Sexual activity: not sexually active since 2014; dates males; no STDs; total partners = 7.   Social Drivers of Corporate investment banker Strain: High Risk (08/31/2022)   Overall Financial Resource Strain (CARDIA)    Difficulty of Paying Living Expenses: Very hard  Food Insecurity: Low Risk  (11/11/2022)   Received from [facility]   Hunger Vital Sign    Worried About Running Out of Food in the Last Year: Never true    Ran Out of Food in the Last Year: Never true  Transportation Needs: No Transportation Needs (11/11/2022)   Received from [facility]    In the past 12 months, has lack of reliable transportation  kept you from medical appointments, meetings, work or from getting things needed for daily living? : No  Physical Activity: Inactive (11/19/2020)   Exercise Vital Sign    Days of Exercise per Week: 0 days    Minutes of Exercise per Session: 0 min  Stress: Not on file  Social Connections: Unknown (07/22/2021)   Received from [facility]   Social Network    Social Network: Not on file  Intimate Partner Violence: Unknown (06/13/2021)   Received from [facility], [facility]   HITS    Physically Hurt: Not on file    Insult or Talk Down To: Not on file    Threaten Physical Harm: Not on file    Scream or Curse: Not on file    Family History  Problem Relation Age of Onset   Diabetes Mother    Hypertension Mother    Heart attack Mother  Hyperlipidemia Mother    Heart disease Mother        heart problems; no AMI   Kidney disease Mother    Hyperlipidemia Sister    Hypertension Sister    Multiple sclerosis Sister    Mental illness Brother    Epilepsy Brother    Diabetes Sister    Sudden death Neg Hx      Current Outpatient Medications:    Syringe/Needle, Disp, (SYRINGE 3CC/25GX1") 25G X 1" 3 ML MISC, 1 mL by Does not apply route every 30 (thirty) days., Disp: 5 each, Rfl: 0   acetaminophen (TYLENOL) 500 MG tablet, Take by mouth., Disp: , Rfl:    albuterol (VENTOLIN HFA) 108 (90 Base) MCG/ACT inhaler, Inhale 2 puffs into the lungs every 4 (four) hours as needed for wheezing or shortness of breath., Disp: , Rfl:    amLODIPine-Valsartan-HCTZ 10-160-25 MG TABS, Take 1 tablet by mouth daily., Disp: 90 tablet, Rfl: 3   ascorbic acid (VITAMIN C) 250 MG tablet, Take 1 tablet by mouth daily., Disp: , Rfl:    aspirin EC 81 MG tablet, Take 81 mg by mouth daily. Swallow whole., Disp: , Rfl:    atorvastatin (LIPITOR) 40 MG tablet, Take 1 tablet (40 mg total) by mouth daily., Disp: 90 tablet, Rfl: 3   benzonatate (TESSALON) 100 MG capsule, Take by mouth., Disp: , Rfl:     celecoxib (CELEBREX) 200 MG capsule, Take by mouth., Disp: , Rfl:    cholecalciferol (VITAMIN D3) 25 MCG (1000 UNIT) tablet, Take 1,000 Units by mouth daily., Disp: , Rfl:    CREON 36000-114000 units CPEP capsule, Take by mouth., Disp: , Rfl:    diltiazem (CARDIZEM CD) 120 MG 24 hr capsule, Take by mouth., Disp: , Rfl:    ezetimibe (ZETIA) 10 MG tablet, Take 1 tablet (10 mg total) by mouth daily., Disp: 90 tablet, Rfl: 3   famotidine (PEPCID) 20 MG tablet, Take 20 mg by mouth at bedtime., Disp: , Rfl:    ferrous sulfate 325 (65 FE) MG EC tablet, Take by mouth., Disp: , Rfl:    folic acid (FOLVITE) 1 MG tablet, Take 1 mg by mouth daily., Disp: , Rfl:    furosemide (LASIX) 20 MG tablet, Take 1 tablet (20 mg total) by mouth daily as needed for edema., Disp: 30 tablet, Rfl: 2   HYDROcodone-acetaminophen (NORCO/VICODIN) 5-325 MG tablet, Take 1 tablet by mouth every 4 (four) hours as needed., Disp: 10 tablet, Rfl: 0   iron sucrose in sodium chloride 0.9 % 100 mL, Take 1 capsule by mouth daily., Disp: , Rfl:    meloxicam (MOBIC) 15 MG tablet, Take 15 mg by mouth as needed for pain., Disp: , Rfl:    methocarbamol (ROBAXIN) 500 MG tablet, Take by mouth., Disp: , Rfl:    nortriptyline (PAMELOR) 10 MG capsule, Take by mouth., Disp: , Rfl:    oxyCODONE (OXY IR/ROXICODONE) 5 MG immediate release tablet, Take 5-10 mg by mouth every 4 (four) hours as needed., Disp: , Rfl:    pantoprazole (PROTONIX) 40 MG tablet, Take 1 tablet (40 mg total) by mouth daily before breakfast., Disp: 90 tablet, Rfl: 3   pregabalin (LYRICA) 25 MG capsule, Take 25 mg twice a day for a week then increase to 50 mg twice a day and continue that dose, Disp: , Rfl:    senna-docusate (SENOKOT-S) 8.6-50 MG tablet, Take by mouth., Disp: , Rfl:    vitamin B-12 (CYANOCOBALAMIN) 1000 MCG tablet, Take 1,000 mcg by mouth  daily., Disp: , Rfl:   Current Facility-Administered Medications:    sodium chloride flush (NS) 0.9 % injection 3 mL, 3 mL,  Intravenous, Q12H, Chilton Si, MD  Facility-Administered Medications Ordered in Other Visits:    cyanocobalamin (VITAMIN B12) injection 1,000 mcg, 1,000 mcg, Intramuscular, Q30 days, Creig Hines, MD, 1,000 mcg at 05/05/23 1215  Physical exam:  Vitals:   05/05/23 1128  BP: 122/85  Pulse: 66  Resp: 19  Temp: (!) 97.2 F (36.2 C)  TempSrc: Tympanic  SpO2: 100%  Weight: 251 lb 9.6 oz (114.1 kg)   Physical Exam Cardiovascular:     Rate and Rhythm: Normal rate and regular rhythm.     Heart sounds: Normal heart sounds.  Pulmonary:     Effort: Pulmonary effort is normal.     Breath sounds: Normal breath sounds.  Skin:    General: Skin is warm and dry.  Neurological:     Mental Status: She is alert and oriented to person, place, and time.         Latest Ref Rng & Units 09/09/2022    3:27 PM  CMP  Glucose 70 - 99 mg/dL 98   BUN 6 - 20 mg/dL 38   Creatinine 4.09 - 1.00 mg/dL 8.11   Sodium 914 - 782 mmol/L 135   Potassium 3.5 - 5.1 mmol/L 3.6   Chloride 98 - 111 mmol/L 100   CO2 22 - 32 mmol/L 22   Calcium 8.9 - 10.3 mg/dL 9.4       Latest Ref Rng & Units 05/05/2023   11:08 AM  CBC  WBC 4.0 - 10.5 K/uL 5.3   Hemoglobin 12.0 - 15.0 g/dL 95.6   Hematocrit 21.3 - 46.0 % 38.3   Platelets 150 - 400 K/uL 279      Assessment and plan- Patient is a 63 y.o. female here for routine follow-up of normocytic anemia  Patient is currently on monthly B12 injections.  She will receive her next dose today and we will send her injections and syringes for further B12 injections which she can take at home.  Hemoglobin is presently normal at 12.9.  Iron studies are pending.  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  We will continue to monitor that on a yearly basis  I will see her back in 1 year with CBC with differential CMP ferritin and iron studies myeloma panel and serum free light chains   Visit Diagnosis 1. B12  deficiency   2. Normocytic anemia   3. Iron deficiency anemia, unspecified iron deficiency anemia type   4. MGUS (monoclonal gammopathy of unknown significance)      Dr. Owens Shark, MD, MPH [facility] at [facility] [phone] 05/05/2023 1:00 PM

## 2023-10-15 ENCOUNTER — Other Ambulatory Visit: Payer: Self-pay

## 2023-10-22 ENCOUNTER — Telehealth (HOSPITAL_BASED_OUTPATIENT_CLINIC_OR_DEPARTMENT_OTHER): Payer: Self-pay | Admitting: Cardiovascular Disease

## 2023-10-22 ENCOUNTER — Other Ambulatory Visit: Payer: Self-pay

## 2023-10-22 ENCOUNTER — Other Ambulatory Visit (HOSPITAL_BASED_OUTPATIENT_CLINIC_OR_DEPARTMENT_OTHER): Payer: Self-pay | Admitting: Cardiovascular Disease

## 2023-10-22 ENCOUNTER — Encounter: Payer: Self-pay | Admitting: Oncology

## 2023-10-22 MED FILL — Amlodipine-Valsartan-Hydrochlorothiazide Tab 10-160-25 MG: ORAL | 90 days supply | Qty: 90 | Fill #0 | Status: CN

## 2023-10-22 NOTE — Telephone Encounter (Signed)
 CALLED AND CONFIRMED WITH PATIENT THAT THE MEDICATION WAS SENT

## 2023-10-22 NOTE — Telephone Encounter (Signed)
*  STAT* If patient is at the pharmacy, call can be transferred to refill team.   1. Which medications need to be refilled? (please list name of each medication and dose if known)   amLODIPine -Valsartan -HCTZ 10-160-25 MG TABS   2. Would you like to learn more about the convenience, safety, & potential cost savings by using the [facility]?   3. Are you open to using the [facility] (Type [facility]. ).  4. Which pharmacy/location (including street and city if local pharmacy) is medication to be sent to?  [facility]   5. Do they need a 30 day or 90 day supply?   90 day  Patient stated she is completely out of this medication.  Patient want a call back to confirm medication sent.

## 2023-10-25 ENCOUNTER — Other Ambulatory Visit: Payer: Self-pay

## 2023-10-25 ENCOUNTER — Other Ambulatory Visit (HOSPITAL_BASED_OUTPATIENT_CLINIC_OR_DEPARTMENT_OTHER): Payer: Self-pay

## 2023-10-25 MED FILL — Amlodipine-Valsartan-Hydrochlorothiazide Tab 10-160-25 MG: ORAL | 90 days supply | Qty: 90 | Fill #0 | Status: CN

## 2023-10-26 ENCOUNTER — Other Ambulatory Visit: Payer: Self-pay

## 2023-11-07 ENCOUNTER — Emergency Department (HOSPITAL_COMMUNITY): Admission: EM | Admit: 2023-11-07 | Discharge: 2023-11-07 | Disposition: A | Discharge status: Home / Self Care

## 2023-11-07 ENCOUNTER — Other Ambulatory Visit: Payer: Self-pay

## 2023-11-07 ENCOUNTER — Emergency Department (HOSPITAL_COMMUNITY)

## 2023-11-07 ENCOUNTER — Encounter: Payer: Self-pay | Admitting: Oncology

## 2023-11-07 ENCOUNTER — Encounter: Payer: Self-pay | Admitting: Emergency Medicine

## 2023-11-07 ENCOUNTER — Ambulatory Visit: Admission: EM | Admit: 2023-11-07 | Discharge: 2023-11-07 | Disposition: A | Discharge status: Home / Self Care

## 2023-11-07 ENCOUNTER — Encounter (HOSPITAL_COMMUNITY): Payer: Self-pay | Admitting: Emergency Medicine

## 2023-11-07 DIAGNOSIS — Z79899 Other long term (current) drug therapy: Secondary | ICD-10-CM | POA: Diagnosis not present

## 2023-11-07 DIAGNOSIS — I10 Essential (primary) hypertension: Secondary | ICD-10-CM | POA: Insufficient documentation

## 2023-11-07 DIAGNOSIS — M5414 Radiculopathy, thoracic region: Secondary | ICD-10-CM | POA: Diagnosis not present

## 2023-11-07 DIAGNOSIS — M5410 Radiculopathy, site unspecified: Secondary | ICD-10-CM

## 2023-11-07 DIAGNOSIS — I2510 Atherosclerotic heart disease of native coronary artery without angina pectoris: Secondary | ICD-10-CM | POA: Diagnosis not present

## 2023-11-07 DIAGNOSIS — Z7982 Long term (current) use of aspirin: Secondary | ICD-10-CM | POA: Insufficient documentation

## 2023-11-07 DIAGNOSIS — Z91040 Latex allergy status: Secondary | ICD-10-CM | POA: Insufficient documentation

## 2023-11-07 DIAGNOSIS — M546 Pain in thoracic spine: Secondary | ICD-10-CM | POA: Diagnosis present

## 2023-11-07 LAB — CBC WITH DIFFERENTIAL/PLATELET
Abs Immature Granulocytes: 0.02 K/uL (ref 0.00–0.07)
Basophils Absolute: 0 K/uL (ref 0.0–0.1)
Basophils Relative: 0 %
Eosinophils Absolute: 0.2 K/uL (ref 0.0–0.5)
Eosinophils Relative: 2 %
HCT: 37.9 % (ref 36.0–46.0)
Hemoglobin: 12.8 g/dL (ref 12.0–15.0)
Immature Granulocytes: 0 %
Lymphocytes Relative: 30 %
Lymphs Abs: 2.3 K/uL (ref 0.7–4.0)
MCH: 29.3 pg (ref 26.0–34.0)
MCHC: 33.8 g/dL (ref 30.0–36.0)
MCV: 86.7 fL (ref 80.0–100.0)
Monocytes Absolute: 0.6 K/uL (ref 0.1–1.0)
Monocytes Relative: 8 %
Neutro Abs: 4.7 K/uL (ref 1.7–7.7)
Neutrophils Relative %: 60 %
Platelets: 290 K/uL (ref 150–400)
RBC: 4.37 MIL/uL (ref 3.87–5.11)
RDW: 12.7 % (ref 11.5–15.5)
WBC: 7.8 K/uL (ref 4.0–10.5)
nRBC: 0 % (ref 0.0–0.2)

## 2023-11-07 LAB — TROPONIN I (HIGH SENSITIVITY)
Troponin I (High Sensitivity): 3 ng/L (ref <18)
Troponin I (High Sensitivity): 4 ng/L (ref <18)

## 2023-11-07 LAB — URINALYSIS, ROUTINE W REFLEX MICROSCOPIC
Bilirubin Urine: NEGATIVE
Glucose, UA: NEGATIVE mg/dL
Hgb urine dipstick: NEGATIVE
Ketones, ur: NEGATIVE mg/dL
Leukocytes,Ua: NEGATIVE
Nitrite: NEGATIVE
Protein, ur: NEGATIVE mg/dL
Specific Gravity, Urine: 1.021 (ref 1.005–1.030)
pH: 5 (ref 5.0–8.0)

## 2023-11-07 LAB — COMPREHENSIVE METABOLIC PANEL WITH GFR
ALT: 15 U/L (ref 0–44)
AST: 14 U/L — ABNORMAL LOW (ref 15–41)
Albumin: 4 g/dL (ref 3.5–5.0)
Alkaline Phosphatase: 118 U/L (ref 38–126)
Anion gap: 11 (ref 5–15)
BUN: 30 mg/dL — ABNORMAL HIGH (ref 8–23)
CO2: 26 mmol/L (ref 22–32)
Calcium: 9.1 mg/dL (ref 8.9–10.3)
Chloride: 101 mmol/L (ref 98–111)
Creatinine, Ser: 1.09 mg/dL — ABNORMAL HIGH (ref 0.44–1.00)
GFR, Estimated: 58 mL/min — ABNORMAL LOW (ref >60)
Glucose, Bld: 122 mg/dL — ABNORMAL HIGH (ref 70–99)
Potassium: 3.4 mmol/L — ABNORMAL LOW (ref 3.5–5.1)
Sodium: 138 mmol/L (ref 135–145)
Total Bilirubin: 0.7 mg/dL (ref 0.0–1.2)
Total Protein: 7.4 g/dL (ref 6.5–8.1)

## 2023-11-07 LAB — D-DIMER, QUANTITATIVE: D-Dimer, Quant: 0.66 ug{FEU}/mL — ABNORMAL HIGH (ref 0.00–0.50)

## 2023-11-07 LAB — LIPASE, BLOOD: Lipase: 34 U/L (ref 11–51)

## 2023-11-07 MED ORDER — OXYCODONE-ACETAMINOPHEN 5-325 MG PO TABS
1.0000 | ORAL_TABLET | Freq: Once | ORAL | Status: AC
  Administered 2023-11-07: 1 via ORAL
  Filled 2023-11-07: qty 1

## 2023-11-07 MED ORDER — OXYCODONE-ACETAMINOPHEN 5-325 MG PO TABS: 1.0000 | ORAL_TABLET | Freq: Four times a day (QID) | ORAL | 0 refills | Status: AC | PRN

## 2023-11-07 MED ORDER — NAPROXEN 500 MG PO TABS: 500.0000 mg | ORAL_TABLET | Freq: Two times a day (BID) | ORAL | 0 refills | Status: AC

## 2023-11-07 MED ORDER — METHOCARBAMOL 500 MG PO TABS
1000.0000 mg | ORAL_TABLET | Freq: Once | ORAL | Status: AC
  Administered 2023-11-07: 1000 mg via ORAL
  Filled 2023-11-07: qty 2

## 2023-11-07 MED ORDER — METHOCARBAMOL 500 MG PO TABS: 500.0000 mg | ORAL_TABLET | Freq: Two times a day (BID) | ORAL | 0 refills | Status: AC

## 2023-11-07 MED ORDER — IOHEXOL 350 MG/ML SOLN
75.0000 mL | Freq: Once | INTRAVENOUS | Status: AC | PRN
  Administered 2023-11-07: 75 mL via INTRAVENOUS

## 2023-11-07 MED ORDER — KETOROLAC TROMETHAMINE 15 MG/ML IJ SOLN
15.0000 mg | Freq: Once | INTRAMUSCULAR | Status: AC
  Administered 2023-11-07: 15 mg via INTRAVENOUS
  Filled 2023-11-07: qty 1

## 2023-11-07 NOTE — ED Triage Notes (Signed)
 Mid back pain since Thursday.  States pain moves from other area.  States sometimes its in the flank area, then upper stomach and left hips.  Has taken naprosyn  for pain.

## 2023-11-07 NOTE — ED Provider Notes (Signed)
 [place] EMERGENCY DEPARTMENT AT [facility] Provider Note   CSN: [id number] Arrival date & time: 11/07/23  1444     Patient presents with: Back Pain   Audrey Farley is a 63 y.o. female.   63 year old female with past medical history of hypertension, hyperlipidemia, and coronary artery disease presenting to the emergency department today with pain in her left thoracic region.  The patient states this has been going on now for the past few days.  Reports that she has had issues with this intermittently throughout the year this year.  She states that she also having pain that wraps around in her epigastric region and left upper chest.  She initially went to urgent care and was sent to the emergency department for further evaluation.  She denies a history of DVT or pulmonary embolism, recent surgeries, recent travel.  The pain is worse with movement but also with deep breathing.  She states that the pain sometimes does take her breath away.  She was sent to the ER for further evaluation.  Looks like when she first arrived she was mildly tachycardic.  She denies any fevers, chills, or cough.  Denies any focal weakness, numbness, or tingling.   Back Pain Associated symptoms: chest pain        Prior to Admission medications   Medication Sig Start Date End Date Taking? Authorizing Provider  methocarbamol  (ROBAXIN ) 500 MG tablet Take 1 tablet (500 mg total) by mouth 2 (two) times daily. 11/07/23  Yes Ula Prentice SAUNDERS, MD  naproxen  (NAPROSYN ) 500 MG tablet Take 1 tablet (500 mg total) by mouth 2 (two) times daily. 11/07/23  Yes Ula Prentice SAUNDERS, MD  oxyCODONE -acetaminophen  (PERCOCET/ROXICET) 5-325 MG tablet Take 1 tablet by mouth every 6 (six) hours as needed for severe pain (pain score 7-10). 11/07/23  Yes Ula Prentice SAUNDERS, MD  albuterol (VENTOLIN HFA) 108 (90 Base) MCG/ACT inhaler Inhale 2 puffs into the lungs every 4 (four) hours as needed for wheezing or shortness of breath.    [provider]  amLODIPine -Valsartan -HCTZ 10-160-25 MG TABS Take 1 tablet by mouth daily. PATIENT MUST CALL AND SCHEDULE ANNUAL APPOINTMENT FOR FURTHER REFILLS 10/22/23   Raford Riggs, MD  ascorbic acid (VITAMIN C) 250 MG tablet Take 1 tablet by mouth daily. 02/24/21   [provider]  aspirin  EC 81 MG tablet Take 81 mg by mouth daily. Swallow whole.    [provider]  atorvastatin  (LIPITOR) 40 MG tablet Take 1 tablet (40 mg total) by mouth daily. 09/21/22   Walker, Caitlin S, NP  benzonatate (TESSALON) 100 MG capsule Take by mouth. 01/15/21   [provider]  cholecalciferol (VITAMIN D3) 25 MCG (1000 UNIT) tablet Take 1,000 Units by mouth daily.    [provider]  CREON 36000-114000 units CPEP capsule Take by mouth.    [provider]  diltiazem  (CARDIZEM  CD) 120 MG 24 hr capsule Take by mouth.    [provider]  ezetimibe  (ZETIA ) 10 MG tablet Take 1 tablet (10 mg total) by mouth daily. 12/22/21 12/17/22  Vannie Reche RAMAN, NP  famotidine (PEPCID) 20 MG tablet Take 20 mg by mouth at bedtime. 04/01/22   [provider]  ferrous sulfate 325 (65 FE) MG EC tablet Take by mouth. 01/24/21   [provider]  folic acid  (FOLVITE ) 1 MG tablet Take 1 mg by mouth daily.    [provider]  furosemide  (LASIX ) 20 MG tablet Take 1 tablet (20 mg total)  by mouth daily as needed for edema. 06/25/22 09/23/22  Vannie Reche RAMAN, NP  iron  sucrose in sodium chloride  0.9 % 100 mL Take 1 capsule by mouth daily.    [provider]  meloxicam  (MOBIC ) 15 MG tablet Take 15 mg by mouth as needed for pain.    [provider]  methocarbamol  (ROBAXIN ) 500 MG tablet Take by mouth. 06/18/21   [provider]  nortriptyline (PAMELOR) 10 MG capsule Take by mouth. 04/06/22   [provider]  oxyCODONE  (OXY IR/ROXICODONE ) 5 MG immediate release tablet Take 5-10 mg by mouth every 4 (four) hours as needed. 03/05/21    [provider]  pantoprazole  (PROTONIX ) 40 MG tablet Take 1 tablet (40 mg total) by mouth daily before breakfast. 12/19/21 12/14/22  Walker, Caitlin S, NP  pregabalin (LYRICA) 25 MG capsule Take 25 mg twice a day for a week then increase to 50 mg twice a day and continue that dose 05/04/22   [provider]  senna-docusate (SENOKOT-S) 8.6-50 MG tablet Take by mouth. 06/18/21   [provider]  Syringe/Needle, Disp, (SYRINGE 3CC/25GX1) 25G X 1 3 ML MISC 1 mL by Does not apply route every 30 (thirty) days. 05/05/23   Melanee Annah BROCKS, MD  vitamin B-12 (CYANOCOBALAMIN ) 1000 MCG tablet Take 1,000 mcg by mouth daily.    [provider]    Allergies: Celecoxib , Mirabegron, Prunus persica, and Latex    Review of Systems  Cardiovascular:  Positive for chest pain.  Musculoskeletal:  Positive for back pain.  All other systems reviewed and are negative.   Updated Vital Signs BP 128/78 (BP Location: Right Arm)   Pulse 83   Temp 98 F (36.7 C) (Oral)   Resp (!) 22   Ht 5' 3.5 (1.613 m)   Wt 113.9 kg   SpO2 95%   BMI 43.77 kg/m   Physical Exam Vitals and nursing note reviewed.   Gen: NAD Eyes: PERRL, EOMI HEENT: no oropharyngeal swelling Neck: trachea midline Resp: clear to auscultation bilaterally Card: RRR, no murmurs, rubs, or gallops Abd: nontender, nondistended Extremities: no calf tenderness, no edema Vascular: 2+ radial pulses bilaterally, 2+ DP pulses bilaterally Neuro: Cranial nerves intact, equal strength and sensation throughout bilateral upper and lower extremities with no dysmetria on finger-to-nose testing Skin: no rashes Psyc: acting appropriately   (all labs ordered are listed, but only abnormal results are displayed) Labs Reviewed  COMPREHENSIVE METABOLIC PANEL WITH GFR - Abnormal; Notable for the following components:      Result Value   Potassium 3.4 (*)    Glucose, Bld 122 (*)    BUN 30 (*)    Creatinine, Ser 1.09 (*)    AST  14 (*)    GFR, Estimated 58 (*)    All other components within normal limits  URINALYSIS, ROUTINE W REFLEX MICROSCOPIC - Abnormal; Notable for the following components:   APPearance HAZY (*)    All other components within normal limits  D-DIMER, QUANTITATIVE - Abnormal; Notable for the following components:   D-Dimer, Quant 0.66 (*)    All other components within normal limits  CBC WITH DIFFERENTIAL/PLATELET  LIPASE, BLOOD  TROPONIN I (HIGH SENSITIVITY)  TROPONIN I (HIGH SENSITIVITY)    EKG: None  Radiology: CT Angio Chest PE W and/or Wo Contrast Result Date: 11/07/2023 CLINICAL DATA:  Radiating back and chest pain.  SOB. EXAM: CT ANGIOGRAPHY CHEST WITH CONTRAST TECHNIQUE: Multidetector CT imaging of the chest was performed using the standard protocol during  bolus administration of intravenous contrast. Multiplanar CT image reconstructions and MIPs were obtained to evaluate the vascular anatomy. RADIATION DOSE REDUCTION: This exam was performed according to the departmental dose-optimization program which includes automated exposure control, adjustment of the mA and/or kV according to patient size and/or use of iterative reconstruction technique. CONTRAST:  75mL OMNIPAQUE  IOHEXOL  350 MG/ML SOLN COMPARISON:  None Available. FINDINGS: Cardiovascular: Satisfactory opacification of the pulmonary arteries to the segmental level. No evidence of pulmonary embolism. Normal heart size. No pericardial effusion. Mediastinum/Nodes: No enlarged mediastinal, hilar, or axillary lymph nodes. Thyroid gland, trachea, and esophagus demonstrate no significant findings. Lungs/Pleura: Lungs are clear. No pleural effusion or pneumothorax. Upper Abdomen: No acute abnormality. Musculoskeletal: No chest wall abnormality. No acute or significant osseous findings. There are thoracic degenerative changes. Review of the MIP images confirms the above findings. IMPRESSION: No acute cardiopulmonary process. Electronically Signed    By: Fonda Field M.D.   On: 11/07/2023 19:10   DG Chest Portable 1 View Result Date: 11/07/2023 CLINICAL DATA:  Upper left back pain and moving to right lower side. EXAM: PORTABLE CHEST 1 VIEW COMPARISON:  01/17/2013. FINDINGS: The heart is enlarged and the mediastinal contour is within normal limits. Atherosclerotic calcification of the aorta is noted. No consolidation, effusion, or pneumothorax is seen. No acute osseous abnormality. IMPRESSION: No active disease. Electronically Signed   By: Leita Birmingham M.D.   On: 11/07/2023 16:06     Procedures   Medications Ordered in the ED  oxyCODONE -acetaminophen  (PERCOCET/ROXICET) 5-325 MG per tablet 1 tablet (has no administration in time range)  ketorolac  (TORADOL ) 15 MG/ML injection 15 mg (15 mg Intravenous Given 11/07/23 1528)  methocarbamol  (ROBAXIN ) tablet 1,000 mg (1,000 mg Oral Given 11/07/23 1533)  iohexol  (OMNIPAQUE ) 350 MG/ML injection 75 mL (75 mLs Intravenous Contrast Given 11/07/23 1853)                                    Medical Decision Making 63 year old female with past medical history of hypertension, coronary artery disease, and hyperlipidemia presenting to the emergency department today with pain in her left back and chest.  This somewhat reproducible here on exam.  The patient reports this is pleuritic here and was tachycardic on arrival.  Will further evaluate here with basic lab as well as an EKG, chest x-ray, troponin to further evaluate for ACS, pulmonary edema, pulmonary traits, or pneumothorax.  Will also obtain a D-dimer to evaluate for pulmonary embolism.  Will obtain LFTs and lipase to evaluate for hepatobiliary pathology or pancreatitis.  I will give the patient Toradol  as well as Robaxin  for her symptoms I suspect this is likely musculoskeletal.  Neurovascular exam is reassuring and she does not have any findings concerning for acute neurologic issue or aortic dissection at this time.  I will reevaluate for ultimate  disposition.  The patient's workup here is reassuring.  Her D-dimer was elevated and subsequent CT angiogram was negative.  Her EKG is nonischemic and she had 2 troponins that were negative.  She is feeling better on reassessment.  Think she is stable for discharge.  She is encouraged to follow-up with her primary care doctor and she does have an orthopedist as well and is encouraged to follow-up regarding this.  She is discharged with return precautions.  Amount and/or Complexity of Data Reviewed Labs: ordered. Radiology: ordered.  Risk Prescription drug management.  Final diagnoses:  Radiculopathy, unspecified spinal region    ED Discharge Orders          Ordered    methocarbamol  (ROBAXIN ) 500 MG tablet  2 times daily        11/07/23 1939    naproxen  (NAPROSYN ) 500 MG tablet  2 times daily        11/07/23 1939    oxyCODONE -acetaminophen  (PERCOCET/ROXICET) 5-325 MG tablet  Every 6 hours PRN        11/07/23 1939               Ula Prentice SAUNDERS, MD 11/07/23 1941

## 2023-11-07 NOTE — ED Triage Notes (Signed)
 Patient arrives by POV from UC c/o upper left back pain that moves into lower right side and some in her chest. Patient describes pain as cramping. States when pain is severe causes her to lose her breath.

## 2023-11-07 NOTE — Discharge Instructions (Addendum)
 Your workup today was reassuring.  I think that you do have a pinched nerve.  Please take the naproxen  twice daily as needed for pain.  You may also take the methocarbamol  which is a muscle relaxer.  Do not drive or drink alcohol taking this as it may make you drowsy.  If you are still having pain is okay to take the Percocet.  Do not drive or drink alcohol taking this as it may make you drowsy.  Please follow-up with your doctor and return to the emergency department for worsening symptoms.

## 2023-11-07 NOTE — ED Notes (Signed)
 Patient is being discharged from the Urgent Care and sent to the Emergency Department via private vehicle . Per NP, patient is in need of higher level of care due to back pain, upper stomach pain and right hip pain. Patient is aware and verbalizes understanding of plan of care.  Vitals:   11/07/23 1425  BP: 108/81  Pulse: (!) 113  Resp: 20  Temp: 98.1 F (36.7 C)  SpO2: 96%

## 2023-11-08 ENCOUNTER — Encounter: Payer: Self-pay | Admitting: Oncology

## 2023-11-22 ENCOUNTER — Encounter (HOSPITAL_COMMUNITY): Payer: Self-pay

## 2023-11-22 ENCOUNTER — Emergency Department (HOSPITAL_COMMUNITY)
Admission: EM | Admit: 2023-11-22 | Discharge: 2023-11-22 | Disposition: A | Discharge status: Home / Self Care | Attending: Emergency Medicine | Admitting: Emergency Medicine

## 2023-11-22 ENCOUNTER — Other Ambulatory Visit: Payer: Self-pay

## 2023-11-22 DIAGNOSIS — S0502XA Injury of conjunctiva and corneal abrasion without foreign body, left eye, initial encounter: Secondary | ICD-10-CM | POA: Diagnosis not present

## 2023-11-22 DIAGNOSIS — H5789 Other specified disorders of eye and adnexa: Secondary | ICD-10-CM | POA: Diagnosis present

## 2023-11-22 DIAGNOSIS — Z7982 Long term (current) use of aspirin: Secondary | ICD-10-CM | POA: Diagnosis not present

## 2023-11-22 DIAGNOSIS — Z91040 Latex allergy status: Secondary | ICD-10-CM | POA: Insufficient documentation

## 2023-11-22 DIAGNOSIS — H1132 Conjunctival hemorrhage, left eye: Secondary | ICD-10-CM | POA: Insufficient documentation

## 2023-11-22 DIAGNOSIS — W458XXA Other foreign body or object entering through skin, initial encounter: Secondary | ICD-10-CM | POA: Insufficient documentation

## 2023-11-22 MED ORDER — FLUORESCEIN SODIUM 1 MG OP STRP
1.0000 | ORAL_STRIP | Freq: Once | OPHTHALMIC | Status: AC
  Administered 2023-11-22: 1 via OPHTHALMIC
  Filled 2023-11-22: qty 1

## 2023-11-22 MED ORDER — ERYTHROMYCIN 5 MG/GM OP OINT
TOPICAL_OINTMENT | Freq: Once | OPHTHALMIC | Status: AC
  Administered 2023-11-22: 1 via OPHTHALMIC
  Filled 2023-11-22: qty 3.5

## 2023-11-22 MED ORDER — TETRACAINE HCL 0.5 % OP SOLN
2.0000 [drp] | Freq: Once | OPHTHALMIC | Status: AC
  Administered 2023-11-22: 2 [drp] via OPHTHALMIC
  Filled 2023-11-22: qty 4

## 2023-11-22 MED ORDER — OFLOXACIN 0.3 % OP SOLN: 2.0000 [drp] | Freq: Four times a day (QID) | OPHTHALMIC | 0 refills | Status: AC

## 2023-11-22 NOTE — ED Triage Notes (Signed)
 Left eye has popped blood vessel, pt noticed it this AM when she woke up. Denies vision changes. Eye is watering. Denies injury

## 2023-11-22 NOTE — ED Notes (Signed)
 ED Provider at bedside.

## 2023-11-22 NOTE — Discharge Instructions (Signed)
 You are seen here today for your eye pain and watering.  You have been prescribed an antibiotic eyedrop to take for the corneal abrasion you have sustained.  Regarding the redness in your eye this will resolve on its own over the next Avril days.  Follow-up in the outpatient setting with your eye doctor and return to the ER with any severe symptoms.

## 2023-11-22 NOTE — ED Provider Notes (Signed)
 [place] EMERGENCY DEPARTMENT AT [facility] Provider Note   CSN: [id number] Arrival date & time: 11/22/23  9941     Patient presents with: Eye Problem   Audrey Farley is a 63 y.o. female who presents with concern for redness and drainage in the left eye, foreign body sensation x 24 hours.  No vision changes, no pain in the eye.  Patient that she has been rubbing her eyes significantly.   HPI     Prior to Admission medications   Medication Sig Start Date End Date Taking? Authorizing Provider  albuterol (VENTOLIN HFA) 108 (90 Base) MCG/ACT inhaler Inhale 2 puffs into the lungs every 4 (four) hours as needed for wheezing or shortness of breath.   Yes [provider]  amLODIPine -Valsartan -HCTZ 10-160-25 MG TABS Take 1 tablet by mouth daily. PATIENT MUST CALL AND SCHEDULE ANNUAL APPOINTMENT FOR FURTHER REFILLS 10/22/23  Yes Audrey Riggs, MD  aspirin  EC 81 MG tablet Take 81 mg by mouth daily. Swallow whole.   Yes [provider]  atorvastatin  (LIPITOR) 20 MG tablet Take 20 mg by mouth daily.   Yes [provider]  cholecalciferol (VITAMIN D3) 25 MCG (1000 UNIT) tablet Take 1,000 Units by mouth every other day.   Yes [provider]  ferrous sulfate 325 (65 FE) MG EC tablet Take 325 mg by mouth every Monday, Wednesday, and Friday. 01/24/21  Yes [provider]  folic acid  (FOLVITE ) 1 MG tablet Take 1 mg by mouth daily as needed.   Yes [provider]  naproxen  (NAPROSYN ) 500 MG tablet Take 1 tablet (500 mg total) by mouth 2 (two) times daily. Patient taking differently: Take 500 mg by mouth 2 (two) times daily as needed. 11/07/23  Yes Audrey Prentice SAUNDERS, MD  ofloxacin  (OCUFLOX ) 0.3 % ophthalmic solution Place 2 drops into the left eye 4 (four) times daily for 7 days. 11/22/23 11/29/23 Yes Audrey Lepore R, PA-C  oxyCODONE -acetaminophen  (PERCOCET/ROXICET) 5-325 MG tablet Take 1 tablet by mouth every 6 (six) hours as needed  for severe pain (pain score 7-10). 11/07/23  Yes Audrey Prentice SAUNDERS, MD  pregabalin (LYRICA) 25 MG capsule Take 25 mg by mouth daily as needed. 05/04/22  Yes [provider]  vitamin B-12 (CYANOCOBALAMIN ) 1000 MCG tablet Take 1,000 mcg by mouth daily.   Yes [provider]  atorvastatin  (LIPITOR) 40 MG tablet Take 1 tablet (40 mg total) by mouth daily. Patient not taking: Reported on 11/22/2023 09/21/22   Audrey Reche RAMAN, NP  ezetimibe  (ZETIA ) 10 MG tablet Take 1 tablet (10 mg total) by mouth daily. 12/22/21 12/17/22  Audrey Reche RAMAN, NP  furosemide  (LASIX ) 20 MG tablet Take 1 tablet (20 mg total) by mouth daily as needed for edema. 06/25/22 09/23/22  Audrey Reche RAMAN, NP  methocarbamol  (ROBAXIN ) 500 MG tablet Take 1 tablet (500 mg total) by mouth 2 (two) times daily. Patient not taking: Reported on 11/22/2023 11/07/23   Audrey Prentice SAUNDERS, MD  pantoprazole  (PROTONIX ) 40 MG tablet Take 1 tablet (40 mg total) by mouth daily before breakfast. 12/19/21 12/14/22  Farley, Audrey S, NP  Syringe/Needle, Disp, (SYRINGE 3CC/25GX1) 25G X 1 3 ML MISC 1 mL by Does not apply route every 30 (thirty) days. 05/05/23   Audrey Annah BROCKS, MD    Allergies: Celecoxib , Mirabegron, Prunus persica, and Latex    Review of Systems  Eyes:  Positive for discharge and redness.    Updated Vital Signs BP (!) 152/86 (BP Location: Right Arm)  Pulse (!) 57   Temp 98.3 F (36.8 C) (Oral)   Resp 16   Ht 5' 3.5 (1.613 m)   Wt 116.6 kg   SpO2 97%   BMI 44.81 kg/m   Physical Exam Vitals and nursing note reviewed.  Constitutional:      Appearance: She is not ill-appearing or toxic-appearing.  HENT:     Head: Normocephalic and atraumatic.  Eyes:     General: Lids are normal. Lids are everted, no foreign bodies appreciated. Vision grossly intact. No scleral icterus.       Right eye: No discharge.        Left eye: No discharge.     Extraocular Movements: Extraocular movements intact.     Conjunctiva/sclera:      Left eye: Left conjunctiva is injected. Chemosis present.     Pupils: Pupils are equal, round, and reactive to light.     Left eye: Corneal abrasion and fluorescein  uptake present. Seidel exam negative.  Pulmonary:     Effort: Pulmonary effort is normal.  Skin:    General: Skin is warm and dry.     Capillary Refill: Capillary refill takes less than 2 seconds.  Neurological:     General: No focal deficit present.     Mental Status: She is alert.  Psychiatric:        Mood and Affect: Mood normal.     (all labs ordered are listed, but only abnormal results are displayed) Labs Reviewed - No data to display  EKG: None  Radiology: No results found.   Procedures   Medications Ordered in the ED  fluorescein  ophthalmic strip 1 strip (1 strip Both Eyes Given 11/22/23 0421)  tetracaine  (PONTOCAINE) 0.5 % ophthalmic solution 2 drop (2 drops Both Eyes Given 11/22/23 0421)  erythromycin  ophthalmic ointment (1 Application Left Eye Given 11/22/23 0421)                                    Medical Decision Making 63 year old female presents with concern for left eye foreign body sensation redness and watering.  Hypertensive on intake and vitals otherwise normal.  Ocular exam as above with corneal abrasions to left eye and fluorescein  exam without Sidel sign.  Subconjunctival hemorrhage on the left outer lower lower eye.   Risk Prescription drug management.   There is ointment applied for corneal abrasion, will discharge with antibiotic eyedrops. Clinical concern for emergent underlying etiology of this patient'Farley symptoms that would warrant further ED workup or inpatient management is exceedingly low. Audrey Farley voiced understanding of her medical evaluation and treatment plan. Each of their questions answered to their expressed satisfaction.  Return precautions were given.  Patient is well-appearing, stable, and was discharged in good condition.  This chart was dictated using voice recognition  software, Dragon. Despite the best efforts of this provider to proofread and correct errors, errors may still occur which can change documentation meaning.      Final diagnoses:  Abrasion of left cornea, initial encounter  Subconjunctival hemorrhage of left eye    ED Discharge Orders          Ordered    ofloxacin  (OCUFLOX ) 0.3 % ophthalmic solution  4 times daily        11/22/23 0410               Stassi Fadely, Ple[place] SAUNDERS, PA-C 11/22/23 0704    Carita Senior, MD 11/22/23 [phone]

## 2023-11-29 ENCOUNTER — Telehealth: Payer: Self-pay

## 2023-11-29 NOTE — Telephone Encounter (Signed)
**Note De-Identified Sharyon Peitz Obfuscation** Per the [employer] provider portal, this CPAP Titration has been approved from 11/29/2023 - 01/27/2024. Order ID: [id number]     Per the Medicare website, a PA is not required for a CPAP Titration:  I have made the pt aware of the above and I gave her the sleep labs phone number so she can call them to be schedule. She verbalized understanding to all information given and thanked me for my call.

## 2023-12-01 ENCOUNTER — Ambulatory Visit (HOSPITAL_BASED_OUTPATIENT_CLINIC_OR_DEPARTMENT_OTHER): Attending: Cardiology | Admitting: Cardiology

## 2023-12-01 VITALS — Ht 63.5 in | Wt 252.0 lb

## 2023-12-01 DIAGNOSIS — G4733 Obstructive sleep apnea (adult) (pediatric): Secondary | ICD-10-CM | POA: Diagnosis present

## 2023-12-06 NOTE — Procedures (Signed)
   Darryle Law [facility] [address] [place], [place] [address] Tel: [phone]   Fax: [phone]  Titration Interpretation  Patient Name:  Audrey Farley, Audrey Farley Study Date:  12/01/2023 Referring Physician:  Dr. Wilbert Bihari %%startinterp%% Indications for Polysomnography The patient is a 63 year old Female who is 5' 3 and weighs 252.0 lbs. Her BMI equals 44.1.  A full night titration treatment study was performed.  Medications taken at 2030.  ATORVASTATIN   AMLODIPINE    Polysomnogram Data A full night polysomnogram recorded the standard physiologic parameters including EEG, EOG, EMG, EKG, nasal and oral airflow.  Respiratory parameters of chest and abdominal movements were recorded with Respiratory Inductance Plethysmography belts.  Oxygen saturation was recorded by pulse oximetry.   Sleep Architecture The total recording time of the polysomnogram was 432.4 minutes.  The total sleep time was 256.5 minutes.  The patient spent 20.1% of total sleep time in Stage N1, 60.8% in Stage N2, 0.2% in Stages N3, and 18.9% in REM.  Sleep latency was 59.2 minutes.  REM latency was 77.0 minutes.  Sleep Efficiency was 59.3%.  Wake after Sleep Onset time was 116.5 minutes.  Titration Summary The patient was titrated at pressures ranging from 5 cm/H20 up to 11 cm/H20.  The last pressure used in the study was 11 cm/H20.  Respiratory Events The polysomnogram revealed a presence of 0 obstructive, 1 central, and 0 mixed apneas resulting in an Apnea index of 0.2 events per hour.  There were 34 hypopneas (>=3% desaturation and/or arousal) resulting in an Apnea\Hypopnea Index (AHI >=3% desaturation and/or arousal) of 8.2 events per hour.  There were 15 hypopneas (>=4% desaturation) resulting in an Apnea\Hypopnea Index (AHI >=4% desaturation) of 3.7 events per hour.  There were 20 Respiratory Effort Related Arousals resulting in a RERA index of 4.7 events per hour. The Respiratory  Disturbance Index is 12.9 events per hour.  The snore index was0 0 events per hour.  Mean oxygen saturation was 92.8%.  The lowest oxygen saturation during sleep was 87.0%.  Time spent <=88% oxygen saturation was 0.7 minutes (0.2%).  Limb Activity There were 0 limb movements recorded.    Cardiac Summary The average pulse rate was 59.2 bpm.  The minimum pulse rate was 49.0 bpm while the maximum pulse rate was 92.0 bpm.  Cardiac rhythm was normal.  Diagnosis:  Obstructive Sleep Apnea  Recommendations: Recommend a trial of ResMed CPAP at 11cm H2O with small/wide ResMed Airfit F40 full face mask and heated humidity. Close follow-up is necessary to ensure success with CPAP therapy for maximum benefit. A follow-up oximetry study on CPAP is recommended to assess the adequacy of therapy and determine the need for supplemental oxygen or the potential need for Bi-level therapy.  An arterial blood gas to determine the adequacy of baseline ventilation and oxygenation should also be considered. Healthy sleep recommendations include:  adequate nightly sleep (normal 7-9 hrs/night), avoidance of caffeine after noon and alcohol near bedtime, and maintaining a sleep environment that is cool, dark and quiet. Weight loss for overweight patients is recommended.  Even modest amounts of weight loss can significantly improve the severity of sleep apnea. Snoring recommendations include:  weight loss where appropriate, side sleeping, and avoidance of alcohol before bed. Operation of motor vehicle should be avoided when sleepy.    This study was personally reviewed and electronically signed by: Dr. Wilbert Bihari Accredited Board Certified in Sleep Medicine Date/Time: 12/06/2023 6:45PM

## 2023-12-14 ENCOUNTER — Telehealth: Payer: Self-pay | Admitting: *Deleted

## 2023-12-14 NOTE — Telephone Encounter (Signed)
-----   Message from Wilbert Bihari sent at 12/06/2023  6:50 PM EDT ----- Please let patient know that they had a successful PAP titration and let DME know that orders are in EPIC.  Please set up 6 week OV with me.

## 2023-12-14 NOTE — Telephone Encounter (Signed)
The patient has been notified of the result. Left detailed message on voicemail and informed patient to call back..Syniyah Bourne Green, CMA   

## 2023-12-24 NOTE — Telephone Encounter (Signed)
 The patient has been notified of the result and verbalized understanding.  All questions (if any) were answered. Joshua Dalton Seip, CMA 12/24/2023 8:58 AM    DME selection is [facility] Patient understands he will be contacted by [facility] to set up his cpap. Patient understands to call if [facility] does not contact him with new setup in a timely manner. Patient understands they will be called once confirmation has been received from [facility] that they have received their new machine to schedule 10 week follow up appointment.   [facility] notified of new cpap order  Please add to airview Patient was grateful for the call and thanked me.

## 2024-04-05 ENCOUNTER — Encounter: Payer: Self-pay | Admitting: Oncology

## 2024-05-05 ENCOUNTER — Other Ambulatory Visit: Payer: Medicare Other

## 2024-05-05 ENCOUNTER — Ambulatory Visit: Payer: Medicare Other | Admitting: Oncology
